# Patient Record
Sex: Male | Born: 1965 | Race: Black or African American | Hispanic: No | Marital: Single | Smoking: Never smoker
Health system: Southern US, Community
[De-identification: ages and names within clinical notes are randomized; demographics above are authoritative.]

## PROBLEM LIST (undated history)

## (undated) DIAGNOSIS — E119 Type 2 diabetes mellitus without complications: Secondary | ICD-10-CM

## (undated) DIAGNOSIS — E669 Obesity, unspecified: Secondary | ICD-10-CM

## (undated) DIAGNOSIS — M109 Gout, unspecified: Secondary | ICD-10-CM

## (undated) DIAGNOSIS — I739 Peripheral vascular disease, unspecified: Secondary | ICD-10-CM

## (undated) DIAGNOSIS — I10 Essential (primary) hypertension: Secondary | ICD-10-CM

## (undated) HISTORY — DX: Gout, unspecified: M10.9

## (undated) HISTORY — DX: Essential (primary) hypertension: I10

## (undated) HISTORY — DX: Type 2 diabetes mellitus without complications: E11.9

---

## 1999-12-25 ENCOUNTER — Emergency Department (HOSPITAL_COMMUNITY): Admission: EM | Admit: 1999-12-25 | Discharge: 1999-12-25 | Payer: Self-pay | Admitting: Emergency Medicine

## 1999-12-25 ENCOUNTER — Encounter: Payer: Self-pay | Admitting: Emergency Medicine

## 2017-07-05 ENCOUNTER — Emergency Department
Admission: EM | Admit: 2017-07-05 | Discharge: 2017-07-05 | Disposition: A | Discharge status: Home / Self Care | Payer: No Typology Code available for payment source | Attending: Internal Medicine | Admitting: Internal Medicine

## 2017-07-05 DIAGNOSIS — E1165 Type 2 diabetes mellitus with hyperglycemia: Secondary | ICD-10-CM | POA: Insufficient documentation

## 2017-07-05 DIAGNOSIS — Z79899 Other long term (current) drug therapy: Secondary | ICD-10-CM | POA: Insufficient documentation

## 2017-07-05 DIAGNOSIS — Z794 Long term (current) use of insulin: Secondary | ICD-10-CM | POA: Insufficient documentation

## 2017-07-05 DIAGNOSIS — M25511 Pain in right shoulder: Secondary | ICD-10-CM | POA: Insufficient documentation

## 2017-07-05 DIAGNOSIS — I10 Essential (primary) hypertension: Secondary | ICD-10-CM | POA: Insufficient documentation

## 2017-07-05 LAB — GLUCOSE WHOLE BLOOD - POCT: Whole Blood Glucose POCT: 228 mg/dL — ABNORMAL HIGH (ref 70–100)

## 2017-07-05 MED ORDER — NAPROXEN 250 MG PO TABS
250.00 mg | ORAL_TABLET | Freq: Two times a day (BID) | ORAL | 0 refills | Status: DC
Start: 2017-07-05 — End: 2017-07-19

## 2017-07-05 MED ORDER — KETOROLAC TROMETHAMINE 60 MG/2ML IM SOLN
60.00 mg | Freq: Once | INTRAMUSCULAR | Status: AC
Start: 2017-07-05 — End: 2017-07-05
  Administered 2017-07-05: 20:00:00 60 mg via INTRAMUSCULAR
  Filled 2017-07-05: qty 2

## 2017-07-05 MED ORDER — OXYCODONE-ACETAMINOPHEN 5-325 MG PO TABS
1.00 | ORAL_TABLET | Freq: Once | ORAL | Status: AC
Start: 2017-07-05 — End: 2017-07-05
  Administered 2017-07-05: 20:00:00 1 via ORAL
  Filled 2017-07-05: qty 1

## 2017-07-05 MED ORDER — CYCLOBENZAPRINE HCL 10 MG PO TABS
10.00 mg | ORAL_TABLET | Freq: Two times a day (BID) | ORAL | 0 refills | Status: AC | PRN
Start: 2017-07-05 — End: 2017-07-20

## 2017-07-05 NOTE — ED Triage Notes (Signed)
[name] is 52 y.o. male who presents to ED with c/o right shoulder pain for the past 2 weeks. Pt denies any known trauma or injury. Attempted to get relief by putting Tiger Balm patch, lidocaine cream and then a heating pad, pt states afterwards he "broke out". Seen by PCP and given anti-inflammatory and muscle relaxer.     BP 134/78   Pulse 98   Temp 98.1 F (36.7 C) (Oral)   Resp 20   Wt 95.3 kg   SpO2 98%

## 2017-07-05 NOTE — Discharge Instructions (Signed)
Arthralgia    You have been diagnosed with Arthralgia.    Arthralgia means pain and stiffness of the joints. People often describe the pain as aching or throbbing. Arthralgia can affect one or more joints. It can be caused by many types of conditions and/or injuries. Often, arthralgia lasts for a long time and people need treatment over months or years. Some causes of arthralgia are:     Infection with a virus.   Many types of infections that are starting to improve. When recovering from infection, sometimes there is joint pain.    Autoimmune diseases (where the body attacks itself). Examples are Lupus or Rheumatoid Arthritis.   Inflammation of the tendons or the fluid-filled sacs (bursa) surrounding your joints.   Low thyroid function.   Depression.     You might need another exam or more tests to find out why you have arthralgias. At this time, the cause of your symptoms does not seem dangerous. You do not need to stay in the hospital.    We don't believe your condition is dangerous right now. However, you need to be careful. Sometimes a problem that seems small can get serious later. This is why it is very important to come back here or go to the nearest Emergency Department unless you are much improved.    Clues that joint pain is dangerous are:     Hot and swollen joints. This may mean they are infected.   Fever (Temperature higher than 100.4F or 38C), weight loss and feeling very ill can be symptoms of severe infection (sepsis).   Severe pain, weakness or numbness (loss of feeling).    Some things you can try at home are:     Over-the-counter pain medications.   Heating pads and warm baths.   Physical therapy.    Follow the instructions for any medication you get prescribed.     Have a close follow-up with your primary care doctor.    YOU SHOULD SEEK MEDICAL ATTENTION IMMEDIATELY, EITHER HERE OR AT THE NEAREST EMERGENCY DEPARTMENT, IF ANY OF THE FOLLOWING OCCURS:     You have a  fever (temperature higher than 100.4F or 38C).   Your pain does not go away or gets worse.   The joint that hurts turns red and/or gets swollen.   You suddenly can t walk.   You don t feel better after treatment or feel you re getting worse.   You get any other symptoms, concerns, or don t get better as expected.    If you can t follow up with your doctor, or if at any time you feel you need to be rechecked or seen again, come back here or go to the nearest emergency department.

## 2017-07-05 NOTE — ED Provider Notes (Signed)
EMERGENCY DEPARTMENT HISTORY AND PHYSICAL EXAM     Physician/Midlevel provider first contact with patient: 07/05/17 1850         Date: 07/05/2017  Patient Name: [name]    History of Presenting Illness     History Provided By: pt    Chief Complaint   Patient presents with   . Shoulder Pain     right   . Rash     Onset:   Timing:   Location:   Quality:   Severity:   Modifying Factors:   Associated Symptoms:     Additional History: [name] is a 52 y.o. male with HTN, DM2 p/w R shoulder pain x 2 wks. Pain is anterolateral, "nagging", worse on ROM and lifting. Pain is worst at the end of the day. He denies known injury. He was seen at an outside hospital last week, X-ray performed at that time was normal.  He was prescribed naproxen and Flexeril, which helped but he has since run out of the medications. Pt works in [employer], he lives out of town but is here for a few months for a project. Denies fevers, DROM, numbness, weakness, tingling.     PCP: Pcp, [facility], [place]      No current facility-administered medications for this encounter.      Current Outpatient Prescriptions   Medication Sig Dispense Refill   . Empagliflozin-metFORMIN HCl (SYNJARDY) 12.07-998 MG Tab Take by mouth     . insulin glargine (TOUJEO MAX SOLOSTAR) 300 UNIT/ML injection pen Inject into the skin nightly     . liraglutide (VICTOZA) 18 MG/3ML injection Inject into the skin     . lisinopril (PRINIVIL,ZESTRIL) 40 MG tablet Take 40 mg by mouth daily     . cyclobenzaprine (FLEXERIL) 10 MG tablet Take 1 tablet (10 mg total) by mouth 2 (two) times daily as needed for Muscle spasms 15 tablet 0   . naproxen (NAPROSYN) 250 MG tablet Take 1 tablet (250 mg total) by mouth 2 (two) times daily with meals 20 tablet 0       Past History     Past Medical History:  Past Medical History:   Diagnosis Date   . Diabetes mellitus    . Hypertension        Past Surgical History:  History reviewed. No pertinent surgical history.    Family  History:  No family history on file.    Social History:  Social History   Substance Use Topics   . Smoking status: Never Smoker   . Smokeless tobacco: Never Used   . Alcohol use No       Allergies:  No Known Allergies    Review of Systems     Review of Systems   Constitutional: Negative for chills and fever.   Musculoskeletal: Positive for joint pain. Negative for falls.   Neurological: Negative for tingling, sensory change and focal weakness.         Physical Exam     Vitals:    07/05/17 1649 07/05/17 1948   BP: 134/78 122/72   Pulse: 98 84   Resp: 20 16   Temp: 98.1 F (36.7 C)    TempSrc: Oral    SpO2: 98% 98%   Weight: 95.3 kg        Physical Exam   Constitutional: He is oriented to person, place, and time. He appears well-developed and well-nourished. No distress.   HENT:   Head: Normocephalic and atraumatic.   Mouth/Throat:  Oropharynx is clear and moist.   Eyes: Pupils are equal, round, and reactive to light. Conjunctivae and EOM are normal.   Neck: Normal range of motion. Neck supple.   Cardiovascular: Normal rate and regular rhythm.    Pulses:       Radial pulses are 2+ on the right side, and 2+ on the left side.   Pulmonary/Chest: Effort normal and breath sounds normal.   Musculoskeletal:        Right shoulder: He exhibits tenderness (mild, anterolateral shoulder). He exhibits normal range of motion, no swelling, no deformity, normal pulse and normal strength.   R shoulder with FROM and 5/5 strength, however pain reported on abduction > 90 degrees and when  moving against resistance.  NV intact throughout.    Neurological: He is alert and oriented to person, place, and time.   Skin: Skin is warm and dry.   Psychiatric: He has a normal mood and affect. His behavior is normal.   Nursing note and vitals reviewed.        Diagnostic Study Results     Labs -     Results     Procedure Component Value Units Date/Time    Glucose Whole Blood - POCT [[id number]]  (Abnormal) Collected:  07/05/17 1926     Updated:   07/05/17 1928     POCT - Glucose Whole blood 228 (H) mg/dL           Radiologic Studies -   Radiology Results (24 Hour)     ** No results found for the last 24 hours. **      .      Medical Decision Making   I am the first provider for this patient.    I reviewed the vital signs, available nursing notes, past medical history, past surgical history, family history and social history.    Vital Signs-Reviewed the patient's vital signs.     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 Declines imaging, as this was done at OSH last week. Suspecting rotator cuff pathology or bursitis. No evidence of septic joint. Pt amenable to d/c home with flexeril, naproxen, sling. Advised against long term use d/t risk of frozen shoulder. F/u with ortho. Pt in agreement with plan.       Diagnosis     Clinical Impression:   1. Acute pain of right shoulder    2. Type 2 diabetes mellitus with hyperglycemia, with long-term current use of insulin        _______________________________    Attestations:  Thea Gist, PA-C, am the primary clinician of record.    Signed by: Sondra Barges, PA-C    _______________________________       Orlene Erm, PA  07/08/17 1733

## 2017-07-19 ENCOUNTER — Emergency Department: Payer: No Typology Code available for payment source

## 2017-07-19 ENCOUNTER — Emergency Department
Admission: EM | Admit: 2017-07-19 | Discharge: 2017-07-19 | Disposition: A | Discharge status: Home / Self Care | Payer: No Typology Code available for payment source | Attending: Emergency Medicine | Admitting: Emergency Medicine

## 2017-07-19 DIAGNOSIS — Z79899 Other long term (current) drug therapy: Secondary | ICD-10-CM | POA: Insufficient documentation

## 2017-07-19 DIAGNOSIS — I10 Essential (primary) hypertension: Secondary | ICD-10-CM | POA: Insufficient documentation

## 2017-07-19 DIAGNOSIS — M25511 Pain in right shoulder: Secondary | ICD-10-CM | POA: Insufficient documentation

## 2017-07-19 DIAGNOSIS — E119 Type 2 diabetes mellitus without complications: Secondary | ICD-10-CM | POA: Insufficient documentation

## 2017-07-19 DIAGNOSIS — Z7984 Long term (current) use of oral hypoglycemic drugs: Secondary | ICD-10-CM | POA: Insufficient documentation

## 2017-07-19 MED ORDER — NAPROXEN 250 MG PO TABS
500.00 mg | ORAL_TABLET | Freq: Once | ORAL | Status: AC
Start: 2017-07-19 — End: 2017-07-19
  Administered 2017-07-19: 19:00:00 500 mg via ORAL
  Filled 2017-07-19: qty 2

## 2017-07-19 MED ORDER — METHOCARBAMOL 500 MG PO TABS
500.00 mg | ORAL_TABLET | Freq: Four times a day (QID) | ORAL | 0 refills | Status: DC
Start: 2017-07-19 — End: 2017-08-04

## 2017-07-19 MED ORDER — NAPROXEN 250 MG PO TABS
500.00 mg | ORAL_TABLET | Freq: Two times a day (BID) | ORAL | 0 refills | Status: DC
Start: 2017-07-19 — End: 2017-08-04

## 2017-07-19 NOTE — ED Provider Notes (Signed)
EMERGENCY DEPARTMENT HISTORY AND PHYSICAL EXAM     Physician/Midlevel provider first contact with patient: 07/19/17 1647         Date: 07/19/2017  Patient Name: [name]  Attending Physician: Darcus Pester, MD  Advanced Practice Provider: Sena Hitch    History of Presenting Illness       History Provided By: Patient    Additional History: [name] is a 52 y.o. male presenting to the ED with c/o R shoulder pain x wks ago. States he woke up with R shoulder pain, but he does not remember any injury. States pain is 10/10, dull, agg with movement.     PCP: Pcp, [facility], MD  SPECIALISTS:     Jamaury, Gumz   Home Medication Instructions [id number]    Printed on:07/20/17 0951   Medication Information 08 AM 10 AM 12 Noon 06 PM 08 PM 10 PM Bed time             cyclobenzaprine (FLEXERIL) 10 MG tablet  Take 1 tablet (10 mg total) by mouth 2 (two) times daily as needed for Muscle spasms            Empagliflozin-metFORMIN HCl (SYNJARDY) 12.07-998 MG Tab  Take by mouth            insulin glargine (TOUJEO MAX SOLOSTAR) 300 UNIT/ML injection pen  Inject into the skin nightly            liraglutide (VICTOZA) 18 MG/3ML injection  Inject into the skin            lisinopril (PRINIVIL,ZESTRIL) 40 MG tablet  Take 40 mg by mouth daily            methocarbamol (ROBAXIN) 500 MG tablet  Take 1 tablet (500 mg total) by mouth 4 (four) times daily            naproxen (NAPROSYN) 250 MG tablet  Take 2 tablets (500 mg total) by mouth 2 (two) times daily with meals                Past History     Past Medical History:  Past Medical History:   Diagnosis Date   . Diabetes mellitus    . Hypertension        Past Surgical History:  History reviewed. No pertinent surgical history.    Family History:  History reviewed. No pertinent family history.    Social History:  Social History     Social History   . Marital status: Single     Spouse name: N/A   . Number of children: N/A   . Years of education: N/A     Social  History Main Topics   . Smoking status: Never Smoker   . Smokeless tobacco: Never Used   . Alcohol use No   . Drug use: No   . Sexual activity: Not Asked     Other Topics Concern   . None     Social History Narrative   . None       Allergies:  No Known Allergies    Review of Systems     Review of Systems   Constitutional: Negative for chills and fever.   HENT: Negative for congestion.    Eyes: Negative for discharge and redness.   Respiratory: Negative for cough and shortness of breath.    Cardiovascular: Negative for chest pain.   Gastrointestinal: Negative for abdominal pain and vomiting.   Musculoskeletal: Positive  for joint pain (R shoulder pain). Negative for back pain and neck pain.   Neurological: Negative for dizziness.       Physical Exam     Vitals:    07/19/17 1605 07/19/17 1835   BP: 136/77 140/88   Pulse: 95 96   Resp: 16 16   Temp: 98.3 F (36.8 C)    TempSrc: Oral    SpO2: 100% 97%   Weight: 97.5 kg    Height: 5' 9.6" (1.768 m)        Physical Exam   Constitutional: He is oriented to person, place, and time. He appears well-developed and well-nourished.   nontoxic   HENT:   Head: Normocephalic and atraumatic.   Eyes: Conjunctivae are normal. Right eye exhibits no discharge. Left eye exhibits no discharge.   Neck: Normal range of motion. Neck supple.   Cardiovascular: Normal rate and regular rhythm.    Pulmonary/Chest: Effort normal and breath sounds normal. No respiratory distress.   Musculoskeletal: Normal range of motion.   R shoulder pain on internal rotation.    Neurological: He is alert and oriented to person, place, and time.   Skin: Skin is warm and dry.   Psychiatric: He has a normal mood and affect.   Nursing note and vitals reviewed.      Diagnostic Study Results     Labs -     Results     ** No results found for the last 24 hours. **          Radiologic Studies -   Radiology Results (24 Hour)     Procedure Component Value Units Date/Time    Shoulder Right 2+ Views [[id number]] Collected:   07/19/17 1713    Order Status:  Completed Updated:  07/19/17 1721    Narrative:       XR SHOULDER RIGHT 2+ VIEWS    CLINICAL INDICATION:   shoulder pain    TECHNIQUE: Right shoulder 3 views 3 images    COMPARISON: None available    FINDINGS:  Visualized portion of the right lung is clear. Bone  mineralization appears within normal limits. No acute fracture or  subluxation is identified. No gross abnormality is seen in the soft  tissues.      Impression:           1.  No acute osseous abnormality is identified.    [name] Felts, MD   07/19/2017 5:17 PM      .    Medical Decision Making   I am the first provider for this patient.    I reviewed the vital signs, available nursing notes, past medical history, past surgical history, family history and social history.    Vital Signs-Reviewed the patient's vital signs.   No data found.      Pulse Oximetry Analysis - Normal 100 % on RA    Clinical Decision Support:       ED Course:    Naproxen 500 mg po      DDX  Shoulder strain.   Tendonitis.   DJD    Provider Notes:    52 y.o. male with R shoulder pain. Will refer to ortho.      Rx use and side effects, results, home self care, discharge instructions, and return precautions discussed extensively with patient. Possibility of evolving illness reviewed. All questions solicited and addressed. Patient is amenable to discharge.       Discharge Prescriptions     Medication Sig Dispense  Auth. Provider    methocarbamol (ROBAXIN) 500 MG tablet Take 1 tablet (500 mg total) by mouth 4 (four) times daily 24 tablet [name] T, PA    naproxen (NAPROSYN) 250 MG tablet Take 2 tablets (500 mg total) by mouth 2 (two) times daily with meals 20 tablet [name] T, [place]            Diagnosis     Clinical Impression:   1. Right shoulder pain, unspecified chronicity        Treatment Plan:   ED Disposition     ED Disposition Condition Date/Time Comment    Discharge  Tue Jul 19, 2017  6:15 PM [name] discharge to home/self  care.    Condition at disposition: Stable            _______________________________    CHART OWNERSHIPOpal Sidles, PA-C, am the primary clinician of record.  _______________________________       Sena Hitch, PA  07/20/17 0865       Darcus Pester, MD  07/21/17 [phone]

## 2017-07-19 NOTE — ED Triage Notes (Signed)
See quick triage

## 2017-07-19 NOTE — Discharge Instructions (Signed)
Arm Pain    You have been seen for arm pain.    Many things can cause arm pain. Most of the causes are not dangerous and will get better on their own. These can be muscle cramps, bruises, strains, pinched nerves, and minor skin infections for example.    You had an exam by your doctor today. He or she decided that the cause of your arm pain does not seem to be serious or dangerous.    If your pain continues, you might need another exam or more tests. This is to find out why you have arm pain. The cause of your symptoms doesn t seem dangerous now. You don t need to stay in the hospital.    Though we don't believe your condition is dangerous right now, it is important to be careful. Sometimes a problem that seems mild can become serious later. This is why it is very important that you return here or go to the nearest Emergency Department if you are not improving or your symptoms are getting worse.    Some things you may try at home are:    Over-the-counter pain medications that have ibuprofen (Advil/Motrin) or acetaminophen (Tylenol) in them. Follow the directions on the package.   Rest the arm as needed. As soon as you are able, start moving your arm again to keep it from getting stiff.    Return here or go to the nearest Emergency Department or follow-up with your doctor if you are not getting better as expected.    Follow the instructions for any medication you are prescribed.     YOU SHOULD SEEK MEDICAL ATTENTION IMMEDIATELY, EITHER HERE OR AT THE NEAREST EMERGENCY DEPARTMENT, IF ANY OF THE FOLLOWING HAPPENS:     You have a fever (temperature higher than 100.4F or 38C).   Your pain does not go away or gets worse.   Your arm or joints (elbow, wrist, shoulder, etc.) get red or swollen.   Your chest hurts or you get short of breath.   You have any other symptoms or concerns, or don t get better as expected.    If you can t follow up with your doctor, or if at any time you feel you need to be  rechecked or seen again, come back here or go to the nearest emergency department.

## 2017-07-26 ENCOUNTER — Emergency Department
Admission: EM | Admit: 2017-07-26 | Discharge: 2017-07-26 | Disposition: A | Discharge status: Home / Self Care | Payer: No Typology Code available for payment source | Attending: Emergency Medicine | Admitting: Emergency Medicine

## 2017-07-26 DIAGNOSIS — M25511 Pain in right shoulder: Secondary | ICD-10-CM | POA: Insufficient documentation

## 2017-07-26 DIAGNOSIS — I10 Essential (primary) hypertension: Secondary | ICD-10-CM | POA: Insufficient documentation

## 2017-07-26 DIAGNOSIS — Z79899 Other long term (current) drug therapy: Secondary | ICD-10-CM | POA: Insufficient documentation

## 2017-07-26 DIAGNOSIS — Z794 Long term (current) use of insulin: Secondary | ICD-10-CM | POA: Insufficient documentation

## 2017-07-26 DIAGNOSIS — E119 Type 2 diabetes mellitus without complications: Secondary | ICD-10-CM | POA: Insufficient documentation

## 2017-07-26 MED ORDER — COLCHICINE 0.6 MG PO TABS
ORAL_TABLET | ORAL | 0 refills | Status: DC
Start: 2017-07-26 — End: 2017-08-01

## 2017-07-26 MED ORDER — TRAMADOL HCL 50 MG PO TABS
50.00 mg | ORAL_TABLET | Freq: Four times a day (QID) | ORAL | 0 refills | Status: DC | PRN
Start: 2017-07-26 — End: 2017-07-26

## 2017-07-26 NOTE — ED Provider Notes (Signed)
EMERGENCY DEPARTMENT HISTORY AND PHYSICAL EXAM     Physician/Midlevel provider first contact with patient: 07/26/17 1600         Date: 07/26/2017  Patient Name: [name]    History of Presenting Illness     Chief Complaint   Patient presents with   . Shoulder Pain       History Provided By: Patient    Chief Complaint: [name] is a 52 y.o. male who presents to the ED c/o persistent right shoulder pain for about 1 month. This is his 3rd ED visit for the same. He had negative x-rays of his shoulder 1 week ago. He states that he made an appointment with Rush Farmer, MD (Orthopedics). He is taking Naprosyn 500 mg without relief. His pain is constant, 9/10, dull, non-radiating, and worse with movement.     PCP: Pcp, [facility], [place]        No current facility-administered medications for this encounter.      Current Outpatient Prescriptions   Medication Sig Dispense Refill   . Empagliflozin-metFORMIN HCl (SYNJARDY) 12.07-998 MG Tab Take by mouth     . insulin glargine (TOUJEO MAX SOLOSTAR) 300 UNIT/ML injection pen Inject into the skin nightly     . liraglutide (VICTOZA) 18 MG/3ML injection Inject into the skin     . lisinopril (PRINIVIL,ZESTRIL) 40 MG tablet Take 40 mg by mouth daily     . methocarbamol (ROBAXIN) 500 MG tablet Take 1 tablet (500 mg total) by mouth 4 (four) times daily 24 tablet 0   . naproxen (NAPROSYN) 250 MG tablet Take 2 tablets (500 mg total) by mouth 2 (two) times daily with meals 20 tablet 0   . traMADol (ULTRAM) 50 MG tablet Take 1 tablet (50 mg total) by mouth every 6 (six) hours as needed for Pain Do not drive or operate machinery while taking this medication 12 tablet 0       Past History     Past Medical History:  Past Medical History:   Diagnosis Date   . Diabetes mellitus    . Hypertension        Past Surgical History:  History reviewed. No pertinent surgical history.    Family History:  Family History   Problem Relation Age of Onset   . Diabetes Mother    .  Hypertension Mother        Social History:  Social History   Substance Use Topics   . Smoking status: Never Smoker   . Smokeless tobacco: Never Used   . Alcohol use No       Allergies:  No Known Allergies    Review of Systems     Review of Systems   Constitutional: Negative for chills and fever.   HENT: Negative for congestion and nosebleeds.    Eyes: Negative for discharge and redness.   Respiratory: Negative for cough, sputum production, shortness of breath and wheezing.    Cardiovascular: Negative for chest pain and palpitations.   Gastrointestinal: Negative for abdominal pain, nausea and vomiting.   Genitourinary: Negative.    Musculoskeletal: Positive for joint pain (right shoulder). Negative for back pain and falls.   Skin: Negative.    Neurological: Negative for dizziness, seizures, loss of consciousness and headaches.   Psychiatric/Behavioral: Negative for suicidal ideas.       Physical Exam   BP 161/79   Pulse 95   Temp 97.7 F (36.5 C) (Oral)   Resp 18   Ht 5'  10.8" (1.798 m)   Wt 97.5 kg   SpO2 98%   BMI 30.16 kg/m     Physical Exam   Constitutional: He is oriented to person, place, and time. He appears well-developed and well-nourished. No distress.   HENT:   Head: Normocephalic and atraumatic.   Eyes: Pupils are equal, round, and reactive to light. Conjunctivae and EOM are normal.   Neck: Normal range of motion. Neck supple.   Cardiovascular: Normal rate, regular rhythm and intact distal pulses.    Pulmonary/Chest: Effort normal. No respiratory distress.   Musculoskeletal: He exhibits tenderness (over right deltoid). He exhibits no edema or deformity.        Right shoulder: He exhibits bony tenderness. He exhibits normal range of motion, no swelling, no effusion, no crepitus, no deformity, no laceration, no spasm, normal pulse and normal strength.        Arms:  Distal NMV status is intact.    Neurological: He is alert and oriented to person, place, and time.   Skin: Skin is warm and dry.    Psychiatric: He has a normal mood and affect. His behavior is normal. Judgment and thought content normal.   Nursing note and vitals reviewed.        Medical Decision Making   I am the first provider for this patient.    I reviewed today's vital signs and ED nursing notes.    Vital Signs-Reviewed the patient's vital signs.     Patient Vitals for the past 12 hrs:   BP Temp Pulse Resp   07/26/17 1606 161/79 97.7 F (36.5 C) 95 18       Pulse Oximetry Analysis - Normal, 98% on RA    Provider Notes:    1630: At the time of discharge, the patient states that he has Ultram from "back home" and it doesn't work. He states that he has a history of gout and took his last 2 colchicine caps this past weekend. The pain temporarily went away. He wants to try the colchicine.       Diagnosis     Clinical Impression:   1. Acute pain of right shoulder        _______________________________           Fransisca Connors, PA  07/26/17 1645       Juliane Poot, MD PhD  07/26/17 [phone]

## 2017-08-01 ENCOUNTER — Encounter (INDEPENDENT_AMBULATORY_CARE_PROVIDER_SITE_OTHER): Payer: Self-pay | Admitting: Family Medicine

## 2017-08-01 ENCOUNTER — Ambulatory Visit (INDEPENDENT_AMBULATORY_CARE_PROVIDER_SITE_OTHER): Payer: No Typology Code available for payment source | Admitting: Family Medicine

## 2017-08-01 VITALS — BP 101/66 | HR 92 | Temp 98.8°F

## 2017-08-01 DIAGNOSIS — B029 Zoster without complications: Secondary | ICD-10-CM

## 2017-08-01 DIAGNOSIS — M7541 Impingement syndrome of right shoulder: Secondary | ICD-10-CM

## 2017-08-01 DIAGNOSIS — M25531 Pain in right wrist: Secondary | ICD-10-CM

## 2017-08-01 MED ORDER — ACYCLOVIR 800 MG PO TABS
800.00 mg | ORAL_TABLET | Freq: Two times a day (BID) | ORAL | 0 refills | Status: DC
Start: 2017-08-01 — End: 2017-08-17

## 2017-08-01 MED ORDER — TRIAMCINOLONE ACETONIDE 40 MG/ML IJ SUSP
40.00 mg | Freq: Once | INTRAMUSCULAR | Status: AC
Start: 2017-08-01 — End: 2017-08-01
  Administered 2017-08-01: 14:00:00 40 mg via INTRA_ARTICULAR

## 2017-08-02 ENCOUNTER — Telehealth (INDEPENDENT_AMBULATORY_CARE_PROVIDER_SITE_OTHER): Payer: Self-pay

## 2017-08-02 NOTE — Telephone Encounter (Signed)
Patient called stating that he was still in a lot of pain and what he should do. The patient had a Cortizone injection yesterday (08/01/17). I spoke with Dr. Shanon Rosser and I explained to the patient that The injection once injected into the space will take a couple days to be absorbed and it could be a little more discomfort until it gets absorbed. He also said that the medication that was prescibed was not working. We informed him it takes a couple days for the medicine to take effect. He also was complaining of constipation and we suggested that he take some Colace. Patient understood and was told to call back in a couple days if things had not improved.

## 2017-08-02 NOTE — Progress Notes (Signed)
[name] is a 52 y.o. right handed male referred by the ER for evaluation and treatment of right shoulder pain. This is evaluated as a personal injury. The pain began several weeks ago. The onset of the pain was sudden, related to increase in use and work. He normally works in [place] but has been brought up to help with a project in the area. . Mechanism of injury: repetitive use pattern. The pain is described as aching, sharp, shooting and tight band. The pain occurs when active. Location is posterior, lateral. No history of dislocation. Symptoms are aggravated by all activities. Symptoms are diminished by  rest, avoiding the painful activities. Limited activities include: all activities. No stiffness, no weakness is reported. 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 Related history: Seen in the ER for several visits for same issue. Also complicated by using Bengay and heating pad that may has caused a shingles eruption down his arm.    Outside reports reviewed: historical medical records.    The following portions of the patient's history were reviewed and updated as appropriate: allergies, current medications, past family history, past medical history, past social history, past surgical history and problem list.  Patient's medications, allergies, past medical, surgical, social and family histories were reviewed and updated as appropriate.     Review of systems: History obtained from the patient  General ROS: negative for - chills, fever or night sweats  Hematological and Lymphatic ROS: negative for - bleeding problems, jaundice or pallor  Endocrine ROS: negative for - hot flashes, malaise/lethargy or mood swings  Respiratory ROS: negative for - cough, shortness of breath or wheezing  Cardiovascular ROS: negative for - chest pain, loss of consciousness or shortness of breath  Gastrointestinal ROS: negative for - abdominal pain, appetite loss or nausea/vomiting  Musculoskeletal ROS: positive  for - see chief complaint     Objective:     Vitals:    08/01/17 1316   BP: 101/66   Pulse: 92   Temp: 98.8 F (37.1 C)       General :   alert, active, cooperative and in no acute distress   Gait: Normal.     Right (Affected) Shoulder   Bruising:   absent   Crepitus:  absent   Tenderness:  lateral acromial, posterior acromial   Effusion:   none present   Biceps Tendon Rupture:   absent   Winging Scapula:   negative   Active ROM:   abduction to 180, flexion to 180, external rotation to 60, internal rotation to 60   Neer:   positive   Hawkins:  negative   Empty Can/Drop Arm:  positive   Speed's:  positive   O'Brien's:  positive   Stability:   normal   Apprehension Sign:   negative/negative   Atrophy:   none noted   Strength:  abduction 4/5, flexion 4/5, supraspinatus 4/5, external rotation 4/5, internal rotation 4/5, elbow flexion 4/5, elbow extension 4/5   Sensation:  intact to light touch; axillary sensation symmetric   Vesicular lesion in various states of healing in a dermatomal pattern down his right arm.    Left (Unaffected) Shoulder   Bruising:   absent   Crepitus:  absent   Tenderness:  There is no tenderness noted at today's visit   Effusion:   none present   Biceps Tendon Rupture:   absent   Winging Scapula:   negative   Active ROM:   abduction to  180, flexion to 180, external rotation to 70, internal rotation to 70   Neer:   negative   Hawkins:  negative   Empty Can/Drop Arm:  negative   Speed's:   negative   O'Brien's:  negative   Stability:   normal   Apprehension Sign:   negative   Atrophy:   none noted   Strength:  abduction 5/5, flexion 5/5, supraspinatus 5/5, external rotation 5/5, internal rotation 5/5, elbow flexion 5/5, elbow extension 5/5   Sensation:  intact to light touch; axillary sensation symmetric     Imaging  None today     Assessment:     Encounter Diagnoses   Name Primary?   . Rotator cuff impingement syndrome of right shoulder Yes   . Herpes zoster without complication    . Right wrist  pain           Plan:   Physical Therapy  Activity Modification  NSAIDs  Follow-up in 6-8 weeks  Discussed possible injection therapies  Proper nutrition, hydration, and rest for recovery  Acyclovir    LANDMARK GUIDED INJECTION  Verbal informed consent was obtained prior to the procedure. Prior to this, the risks and side effects were discussed, including, but not limited to pain, infection, allergic reaction, bleeding, damage to tissues, vessels and nerves, skin atrophy, steroid flare.    I performed a LANDMARK guided Injection of the right  shoulder via a subacromial approach after  chlorohexadine prep using a 22 ga 1.5" needle, injecting 1.0 mL of 40mg /mL Kenalog,  and 6 ml Lidocaine 1% w/o Epi. -----> 50 % relief of symptoms after injection. The patient tolerated the procedure well without complications.    The injection was performed for therapeutic purposes

## 2017-08-04 ENCOUNTER — Encounter (INDEPENDENT_AMBULATORY_CARE_PROVIDER_SITE_OTHER): Payer: Self-pay | Admitting: Family Medicine

## 2017-08-04 ENCOUNTER — Other Ambulatory Visit (INDEPENDENT_AMBULATORY_CARE_PROVIDER_SITE_OTHER): Payer: Self-pay

## 2017-08-04 DIAGNOSIS — M25531 Pain in right wrist: Secondary | ICD-10-CM

## 2017-08-04 DIAGNOSIS — M7541 Impingement syndrome of right shoulder: Secondary | ICD-10-CM

## 2017-08-04 MED ORDER — DICLOFENAC SODIUM 75 MG PO TBEC
75.00 mg | DELAYED_RELEASE_TABLET | Freq: Two times a day (BID) | ORAL | 0 refills | Status: DC
Start: 2017-08-04 — End: 2017-08-17

## 2017-08-04 MED ORDER — ACETAMINOPHEN-CODEINE #3 300-30 MG PO TABS
1.00 | ORAL_TABLET | Freq: Four times a day (QID) | ORAL | 0 refills | Status: AC | PRN
Start: 2017-08-04 — End: 2017-08-11

## 2017-08-04 NOTE — Progress Notes (Signed)
Patient called back that he provided incorrect pharmacy information.  Left detailed message at [facility] on [address]  Patient is there now and will call back as needed.

## 2017-08-04 NOTE — Progress Notes (Signed)
Pt called RE: continued pain shoulder  Stated called earlier in the week but continued prescribed meds as trial for pain relief.   Had significant discussion regarding previous trials of naproxen, flexeril, robaxin, and tramadol.  Pt stated leaving for [place] tomorrow to return home.  Escribed Diclofenac PO and called in T3 so patient able to establish care at home.  PMP ran, looks good.

## 2017-08-17 ENCOUNTER — Emergency Department
Admission: EM | Admit: 2017-08-17 | Discharge: 2017-08-17 | Disposition: A | Discharge status: Home / Self Care | Payer: No Typology Code available for payment source | Attending: Emergency Medicine | Admitting: Emergency Medicine

## 2017-08-17 DIAGNOSIS — Z8249 Family history of ischemic heart disease and other diseases of the circulatory system: Secondary | ICD-10-CM | POA: Insufficient documentation

## 2017-08-17 DIAGNOSIS — Z79899 Other long term (current) drug therapy: Secondary | ICD-10-CM | POA: Insufficient documentation

## 2017-08-17 DIAGNOSIS — Z794 Long term (current) use of insulin: Secondary | ICD-10-CM | POA: Insufficient documentation

## 2017-08-17 DIAGNOSIS — M109 Gout, unspecified: Secondary | ICD-10-CM | POA: Insufficient documentation

## 2017-08-17 DIAGNOSIS — E119 Type 2 diabetes mellitus without complications: Secondary | ICD-10-CM | POA: Insufficient documentation

## 2017-08-17 DIAGNOSIS — I10 Essential (primary) hypertension: Secondary | ICD-10-CM | POA: Insufficient documentation

## 2017-08-17 MED ORDER — KETOROLAC TROMETHAMINE 60 MG/2ML IM SOLN
60.00 mg | Freq: Once | INTRAMUSCULAR | Status: AC
Start: 2017-08-17 — End: 2017-08-17
  Administered 2017-08-17: 11:00:00 60 mg via INTRAMUSCULAR
  Filled 2017-08-17: qty 2

## 2017-08-17 MED ORDER — ONDANSETRON 4 MG PO TBDP
4.00 mg | ORAL_TABLET | Freq: Once | ORAL | Status: AC
Start: 2017-08-17 — End: 2017-08-17
  Administered 2017-08-17: 12:00:00 4 mg via ORAL
  Filled 2017-08-17: qty 1

## 2017-08-17 NOTE — ED Provider Notes (Signed)
EMERGENCY DEPARTMENT HISTORY AND PHYSICAL EXAM     Physician/Midlevel provider first contact with patient: 08/17/17 1100         Date: 08/17/2017  Patient Name: [name]    History of Presenting Illness     Chief Complaint   Patient presents with   . Gout       History Provided By: Patient    Chief Complaint: [name] is a 52 y.o. male with a history to include gout who presents to the ED c/o left great toe pain for 2 days after eating crabs 3 days ago. He used NSAIDs and colchicine but started having diarrhea (from the colchicine). He is working for [employer] and has his PCP in [place]. He denies trauma, fall, or wound. He states that IM Toradol or steroids have worked in the past under these circumstances.     PCP: Pcp, [facility], MD        No current facility-administered medications for this encounter.      Current Outpatient Prescriptions   Medication Sig Dispense Refill   . colchicine 0.6 MG tablet Take 0.6 mg by mouth daily     . Empagliflozin-metFORMIN HCl (SYNJARDY) 12.07-998 MG Tab Take by mouth     . insulin glargine (TOUJEO MAX SOLOSTAR) 300 UNIT/ML injection pen Inject into the skin nightly     . liraglutide (VICTOZA) 18 MG/3ML injection Inject into the skin     . lisinopril (PRINIVIL,ZESTRIL) 40 MG tablet Take 40 mg by mouth daily         Past History     Past Medical History:  Past Medical History:   Diagnosis Date   . Diabetes mellitus    . Gout    . Hypertension        Past Surgical History:  History reviewed. No pertinent surgical history.    Family History:  Family History   Problem Relation Age of Onset   . Diabetes Mother    . Hypertension Mother        Social History:  Social History   Substance Use Topics   . Smoking status: Never Smoker   . Smokeless tobacco: Never Used   . Alcohol use No       Allergies:  No Known Allergies    Review of Systems     Review of Systems   Constitutional: Negative.    HENT: Negative for congestion and nosebleeds.    Eyes: Negative for discharge and  redness.   Respiratory: Negative for cough, hemoptysis and sputum production.    Cardiovascular: Negative for chest pain and leg swelling.   Gastrointestinal: Positive for diarrhea and nausea. Negative for abdominal pain and vomiting.   Musculoskeletal: Positive for joint pain (left great toe). Negative for back pain and falls.   Skin: Negative.    Neurological: Negative for dizziness, seizures, loss of consciousness and headaches.   Psychiatric/Behavioral: Negative for suicidal ideas.       Physical Exam   BP 140/74   Pulse 90   Temp 98.6 F (37 C) (Oral)   Resp 16   Ht 5\' 9"  (1.753 m)   Wt 97.5 kg   SpO2 98%   BMI 31.75 kg/m     Physical Exam   Constitutional: He is oriented to person, place, and time. He appears well-developed and well-nourished. No distress.   HENT:   Head: Normocephalic and atraumatic.   Eyes: Pupils are equal, round, and reactive to light. Conjunctivae and EOM are normal.  Neck: Normal range of motion. Neck supple.   Cardiovascular: Normal rate and regular rhythm.    Pulmonary/Chest: Effort normal. No respiratory distress.   Musculoskeletal: Normal range of motion.        Left foot: There is bony tenderness and swelling. There is normal range of motion, normal capillary refill, no crepitus, no deformity and no laceration.        Feet:    There is no wound or cellulitis. Left great toe tenderness at the MTPJ. Distal NMV status is intact.    Neurological: He is alert and oriented to person, place, and time. He is not disoriented. Coordination and gait normal. GCS eye subscore is 4. GCS verbal subscore is 5. GCS motor subscore is 6.   Skin: Skin is warm and dry.   Psychiatric: He has a normal mood and affect. His behavior is normal. Judgment and thought content normal.   Nursing note and vitals reviewed.        Medical Decision Making   I am the first provider for this patient.    I reviewed today's vital signs and ED nursing notes.    Vital Signs-Reviewed the patient's vital signs.      Patient Vitals for the past 12 hrs:   BP Temp Pulse Resp   08/17/17 1057 140/74 98.6 F (37 C) 90 16       Pulse Oximetry Analysis - Normal, 98% on RA    Provider Notes:      Diagnosis     Clinical Impression:   1. Gouty arthritis of left great toe        _______________________________           Fransisca Connors, PA  08/17/17 1940       Westley Foots, MD  08/18/17 0600

## 2017-08-17 NOTE — Discharge Instructions (Signed)
Gout     You have been diagnosed with gout.     Gout is a very common disease. It is caused by an inflammatory response to crystals in the joint. The big toe is the most commonly affected area. The usual symptoms include redness and pain. A gout attack may be brought on by certain foods or medicines.     A short run of NSAID (Non-Steroidal Anti-Inflammatory Drug) treatment usually works well for this disease. If the symptoms don't go away, you may need a different medicine. You may also need to be placed on a long-term medicine to prevent symptoms. Follow up with your regular doctor for this.     YOU SHOULD SEEK MEDICAL ATTENTION IMMEDIATELY, EITHER HERE OR AT THE NEAREST EMERGENCY DEPARTMENT, IF ANY OF THE FOLLOWING OCCURS:  · Fever (temperature higher than 100.4°F / 38°C).  · Red streaks outside the area of pain into the extremity (foot or arm).  · Pain not helped by the medicine.               Gout Diet (Edu)     Your doctor wanted you to have the following information about a diet that can lessen your gout symptoms...      If you have gout, then your body moves uric acid to the joints of your body. This can be very painful. Uric acid is formed when your body breaks down a chemical called purine. Your body gets a lot of purine from the food you eat. So, eating food that is low in purine and uric acid can lessen the chance of having a gout attack.     Tips for a diet that keeps gout attacks from happening…     · Losing weight decreases the chance of having a gout attack. So, you should eat a diet that helps you lose weight, such as a low-calorie diet.      · Dehydration can also set off a gout attack. So, you should drink more water. You should also drink less soda, juice, and other sugary drinks.     · You should also eat complex carbohydrates such as whole wheat, unprocessed sugars, whole grains, and brown rice.     · You should not drink too much alcohol. This includes beer and wine.     · You should eat less  red meat. Also, you should not eat organ meats, such as liver and kidneys.     · You might want to drink more coffee. (According to some studies, coffee can lower your uric acid level.)     · You should eat a diet with a lot of vitamin C.     · You should drink dairy products that are low in fat, such as 2% or skim milk.

## 2017-08-31 ENCOUNTER — Emergency Department
Admission: EM | Admit: 2017-08-31 | Discharge: 2017-08-31 | Disposition: A | Discharge status: Home / Self Care | Payer: No Typology Code available for payment source | Attending: Emergency Medicine | Admitting: Emergency Medicine

## 2017-08-31 DIAGNOSIS — Z794 Long term (current) use of insulin: Secondary | ICD-10-CM | POA: Insufficient documentation

## 2017-08-31 DIAGNOSIS — M109 Gout, unspecified: Secondary | ICD-10-CM | POA: Insufficient documentation

## 2017-08-31 DIAGNOSIS — E119 Type 2 diabetes mellitus without complications: Secondary | ICD-10-CM | POA: Insufficient documentation

## 2017-08-31 DIAGNOSIS — I10 Essential (primary) hypertension: Secondary | ICD-10-CM | POA: Insufficient documentation

## 2017-08-31 MED ORDER — KETOROLAC TROMETHAMINE 30 MG/ML IJ SOLN
30.00 mg | Freq: Once | INTRAMUSCULAR | Status: AC
Start: 2017-08-31 — End: 2017-08-31
  Administered 2017-08-31: 10:00:00 30 mg via INTRAMUSCULAR
  Filled 2017-08-31: qty 1

## 2017-08-31 MED ORDER — DEXAMETHASONE SODIUM PHOSPHATE 4 MG/ML IJ SOLN (WRAP)
8.00 mg | Freq: Once | INTRAMUSCULAR | Status: AC
Start: 2017-08-31 — End: 2017-08-31
  Administered 2017-08-31: 10:00:00 8 mg via INTRAMUSCULAR
  Filled 2017-08-31: qty 5

## 2017-08-31 NOTE — ED Provider Notes (Signed)
EMERGENCY DEPARTMENT HISTORY AND PHYSICAL EXAM     Physician/Midlevel provider first contact with patient: 08/31/17 0836         Date: 08/31/2017  Patient Name: [name]    History of Presenting Illness     Chief Complaint   Patient presents with   . Gout       History Provided By: Patient    Chief Complaint: Toe pain   Duration: 3 weeks ago  Timing:  Worsening  Location: L great toe  Quality: uncomfortable  Severity: Moderate  Exacerbating factors: walking    Alleviating factors: Tried Tylenol 3 and colchicine w/ minimal relief.   Associated Symptoms: Ankle pain, knee pain, and joint swelling  Pertinent Negatives: Denies fever    Additional History: [name] is a 52 y.o. male w/ h/o DM, HTN, and gout presenting to the ED with 3 week h/o worsening L great toe, L ankle pain, and L knee pain and swelling which worsened 3 days ago and is consistent w/ prior gout flares. He denies fever. Pt tried Tylenol 3 and colchicine w/ minimal relief.     PCP: Pcp, [facility], [place]  SPECIALISTS:    No current facility-administered medications for this encounter.      Current Outpatient Prescriptions   Medication Sig Dispense Refill   . colchicine 0.6 MG tablet Take 0.6 mg by mouth daily     . Empagliflozin-metFORMIN HCl (SYNJARDY) 12.07-998 MG Tab Take by mouth     . insulin glargine (TOUJEO MAX SOLOSTAR) 300 UNIT/ML injection pen Inject into the skin nightly     . liraglutide (VICTOZA) 18 MG/3ML injection Inject into the skin     . lisinopril (PRINIVIL,ZESTRIL) 40 MG tablet Take 40 mg by mouth daily         Past History     Past Medical History:  Past Medical History:   Diagnosis Date   . Diabetes mellitus    . Gout    . Hypertension        Past Surgical History:  History reviewed. No pertinent surgical history.    Family History:  Family History   Problem Relation Age of Onset   . Diabetes Mother    . Hypertension Mother        Social History:  Social History   Substance Use Topics   . Smoking status: Never  Smoker   . Smokeless tobacco: Never Used   . Alcohol use No       Allergies:  No Known Allergies    Review of Systems     Review of Systems   Constitutional: Negative for fever.   Musculoskeletal:        Great toe, knee, ankle pain.        Physical Exam   BP 145/83   Pulse 96   Temp 97.7 F (36.5 C) (Oral)   Resp 16   Ht 5\' 7"  (1.702 m)   Wt 97.5 kg   SpO2 99%   BMI 33.67 kg/m     Physical Exam   Constitutional: He is oriented to person, place, and time. He appears well-developed and well-nourished.   HENT:   Head: Normocephalic and atraumatic.   Eyes: Pupils are equal, round, and reactive to light. EOM are normal.   Neck: Normal range of motion. Neck supple.   Pulmonary/Chest: Effort normal.   Musculoskeletal: Normal range of motion. He exhibits tenderness.   Swelling and mild erythema over L first MTP w/ tenderness. Mild L  ankle and L knee tenderness.    Neurological: He is alert and oriented to person, place, and time.   Skin: Skin is warm and dry. There is erythema.   Psychiatric: He has a normal mood and affect. His behavior is normal.   Nursing note and vitals reviewed.      Diagnostic Study Results     Labs -     Results     ** No results found for the last 24 hours. **          Radiologic Studies -   Radiology Results (24 Hour)     ** No results found for the last 24 hours. **      .    Medical Decision Making   I am the first provider for this patient.    I reviewed the vital signs, available nursing notes, past medical history, past surgical history, family history and social history.    Vital Signs-Reviewed the patient's vital signs.     Patient Vitals for the past 12 hrs:   BP Temp Pulse Resp   08/31/17 0826 145/83 97.7 F (36.5 C) 96 16       Pulse Oximetry Analysis - Normal 99% on RA    Old Medical Records: Nursing notes.     ED Course:     9:19 AM - Discussed plan for Toradol and Decadron in ED--pt requests both, but declines any medications to go home on. States "this usually works."  Discussed f/u with PCP, home self care, discharge instructions, and return precautions with patient. Possibility of evolving illness reviewed. All questions solicited and addressed. Patient states understanding and amenable to discharge.      Provider Notes:   52 y.o. M with L 1st MTP pain, c/w prior gout flares. Pt requests steroid and toradol shots, declines futher medications. No wounds to feet, no signs of joint infection. All questions answered.      Dr. Ander Slade is the primary emergency physician of record.      Diagnosis     Clinical Impression:   1. Gouty arthritis of left great toe        Treatment Plan:   ED Disposition     ED Disposition Condition Date/Time Comment    Discharge  Wed Aug 31, 2017  9:24 AM [name] discharge to home/self care.    Condition at disposition: Stable            _______________________________      Attestations: This note is prepared by Dillard Essex, acting as scribe for Catalina Pizza, MD.    Catalina Pizza, MD - The scribe's documentation has been prepared under my direction and personally reviewed by me in its entirety.  I confirm that the note above accurately reflects all work, treatment, procedures, and medical decision making performed by me.    _______________________________     Kari Baars, MD  09/01/17 1124

## 2017-08-31 NOTE — EDIE (Signed)
COLLECTIVE?NOTIFICATION?08/31/2017 08:08?[name], [name]?MRN: [id number]    Criteria Met      5 + ED Visits in 12 Months    Security and Safety  No recent Security Events currently on file    ED Care Guidelines  There are currently no ED Care Guidelines for this patient. Please check your facility's medical records system.          E.D. Visit Count (12 mo.)  Facility Visits   [place] [facility]: [facility] [place] 4   [facility] 1   Total 5   Note: Visits indicate total known visits.      Recent Emergency Department Visit Summary  Date Facility [facility] Type Diagnoses or Chief Complaint   Aug 31, 2017 [place] [facility]: [facility] [place] [facility]      Gout      Aug 17, 2017 [place] [facility]: [facility] [place] [facility]      left foot pain      Gout, unspecified      Jul 26, 2017 [place] [facility]: [facility] [employer] [place] [facility]      RT Shoulder Pain      Shoulder Pain      Pain in right shoulder      Jul 19, 2017 [place] [facility]: [facility]. [place] [facility]      RT Shoulder Injury      Shoulder Pain      Pain in right shoulder      Jul 05, 2017  - [place] [facility]  Emergency      R Shoulder Pain      Rash      Shoulder Pain      Pain in right shoulder      Type 2 diabetes mellitus with hyperglycemia      Long term (current) use of insulin          Recent Inpatient Visit Summary  No recorded inpatient visits.     Care Providers  There are no care providers on record at this time.   Collective Portal  This patient has registered at the [facility] Emergency Room: [facility] Emergency Department   For more information visit: [email or web address]     PLEASE NOTE:    1.   Any care recommendations and other clinical information are provided as guidelines or for historical purposes  only, and providers should exercise their own clinical judgment when providing care.    2.   You may only use this information for purposes of treatment, payment or health care operations activities, and subject to the limitations of applicable Collective Policies.    3.   You should consult directly with the organization that provided a care guideline or other clinical history with any questions about additional information or accuracy or completeness of information provided.    ? 2019 [place]. - [email or web address]

## 2017-08-31 NOTE — ED Triage Notes (Signed)
See quick triage

## 2017-08-31 NOTE — Discharge Instructions (Signed)
Gout     You have been diagnosed with gout.     Gout is a very common disease. It is caused by an inflammatory response to crystals in the joint. The big toe is the most commonly affected area. The usual symptoms include redness and pain. A gout attack may be brought on by certain foods or medicines.     A short run of NSAID (Non-Steroidal Anti-Inflammatory Drug) treatment usually works well for this disease. If the symptoms don't go away, you may need a different medicine. You may also need to be placed on a long-term medicine to prevent symptoms. Follow up with your regular doctor for this.     YOU SHOULD SEEK MEDICAL ATTENTION IMMEDIATELY, EITHER HERE OR AT THE NEAREST EMERGENCY DEPARTMENT, IF ANY OF THE FOLLOWING OCCURS:  · Fever (temperature higher than 100.4°F / 38°C).  · Red streaks outside the area of pain into the extremity (foot or arm).  · Pain not helped by the medicine.               Gout Diet (Edu)     Your doctor wanted you to have the following information about a diet that can lessen your gout symptoms...      If you have gout, then your body moves uric acid to the joints of your body. This can be very painful. Uric acid is formed when your body breaks down a chemical called purine. Your body gets a lot of purine from the food you eat. So, eating food that is low in purine and uric acid can lessen the chance of having a gout attack.     Tips for a diet that keeps gout attacks from happening…     · Losing weight decreases the chance of having a gout attack. So, you should eat a diet that helps you lose weight, such as a low-calorie diet.      · Dehydration can also set off a gout attack. So, you should drink more water. You should also drink less soda, juice, and other sugary drinks.     · You should also eat complex carbohydrates such as whole wheat, unprocessed sugars, whole grains, and brown rice.     · You should not drink too much alcohol. This includes beer and wine.     · You should eat less  red meat. Also, you should not eat organ meats, such as liver and kidneys.     · You might want to drink more coffee. (According to some studies, coffee can lower your uric acid level.)     · You should eat a diet with a lot of vitamin C.     · You should drink dairy products that are low in fat, such as 2% or skim milk.

## 2017-09-12 ENCOUNTER — Emergency Department
Admission: EM | Admit: 2017-09-12 | Discharge: 2017-09-12 | Disposition: A | Discharge status: Home / Self Care | Payer: No Typology Code available for payment source | Attending: Emergency Medicine | Admitting: Emergency Medicine

## 2017-09-12 DIAGNOSIS — I10 Essential (primary) hypertension: Secondary | ICD-10-CM | POA: Insufficient documentation

## 2017-09-12 DIAGNOSIS — Z79899 Other long term (current) drug therapy: Secondary | ICD-10-CM | POA: Insufficient documentation

## 2017-09-12 DIAGNOSIS — M109 Gout, unspecified: Secondary | ICD-10-CM | POA: Insufficient documentation

## 2017-09-12 DIAGNOSIS — Z794 Long term (current) use of insulin: Secondary | ICD-10-CM | POA: Insufficient documentation

## 2017-09-12 DIAGNOSIS — E119 Type 2 diabetes mellitus without complications: Secondary | ICD-10-CM | POA: Insufficient documentation

## 2017-09-12 DIAGNOSIS — M10461 Other secondary gout, right knee: Secondary | ICD-10-CM

## 2017-09-12 MED ORDER — PREDNISONE 10 MG PO TABS
60.00 mg | ORAL_TABLET | Freq: Once | ORAL | Status: AC
Start: 2017-09-12 — End: 2017-09-12
  Administered 2017-09-12: 12:00:00 60 mg via ORAL
  Filled 2017-09-12: qty 4

## 2017-09-12 MED ORDER — KETOROLAC TROMETHAMINE 30 MG/ML IJ SOLN
30.00 mg | Freq: Once | INTRAMUSCULAR | Status: AC
Start: 2017-09-12 — End: 2017-09-12
  Administered 2017-09-12: 12:00:00 30 mg via INTRAMUSCULAR

## 2017-09-12 MED ORDER — PREDNISONE 20 MG PO TABS
60.00 mg | ORAL_TABLET | Freq: Every day | ORAL | 0 refills | Status: DC
Start: 2017-09-12 — End: 2017-09-17

## 2017-09-12 MED ORDER — KETOROLAC TROMETHAMINE 30 MG/ML IJ SOLN
30.00 mg | Freq: Once | INTRAMUSCULAR | Status: DC
Start: 2017-09-12 — End: 2017-09-12
  Filled 2017-09-12: qty 1

## 2017-09-12 NOTE — ED Provider Notes (Signed)
EMERGENCY DEPARTMENT HISTORY AND PHYSICAL EXAM     Physician/Midlevel provider first contact with patient: 09/12/17 1058         Date: 09/12/2017  Patient Name: [name]  Attending Physician: Donny Pique, MD  Advanced Practice Provider: Rykker Coviello T Taige Housman    History of Presenting Illness       History Provided By: Patient    Chief Complaint:  Chief Complaint   Patient presents with   . Gout     Onset: 1 week  Timing: gradual  Location: L great toe  Quality: tender, swollen  Severity: mild  Exacerbating factors: direct touch  Alleviating factors: none  Associated Symptoms: see ROS  Pertinent Negatives: see ROS    Additional History: [name] is a 52 y.o. male presenting to the ED with gouty flare of his L great toe for past 1 week ago. Pt states his PCP in [place] usually gives a "shot of morphine and a shot of steroids IM that usually takes care of the pain" [facility]     PCP: Pcp, [facility], MD  SPECIALISTS:    No current facility-administered medications for this encounter.      Current Outpatient Prescriptions   Medication Sig Dispense Refill   . colchicine 0.6 MG tablet Take 0.6 mg by mouth daily     . Empagliflozin-metFORMIN HCl (SYNJARDY) 12.07-998 MG Tab Take by mouth     . insulin glargine (TOUJEO MAX SOLOSTAR) 300 UNIT/ML injection pen Inject into the skin nightly     . liraglutide (VICTOZA) 18 MG/3ML injection Inject into the skin     . lisinopril (PRINIVIL,ZESTRIL) 40 MG tablet Take 40 mg by mouth daily     . predniSONE (DELTASONE) 20 MG tablet Take 3 tablets (60 mg total) by mouth daily for 5 days 15 tablet 0       Past History     Past Medical History:  Past Medical History:   Diagnosis Date   . Diabetes mellitus    . Gout    . Hypertension        Past Surgical History:  History reviewed. No pertinent surgical history.    Family History:  Family History   Problem Relation Age of Onset   . Diabetes Mother    . Hypertension Mother         Social History:  Social History     Social History   . Marital status: Single     Spouse name: N/A   . Number of children: N/A   . Years of education: N/A     Social History Main Topics   . Smoking status: Never Smoker   . Smokeless tobacco: Never Used   . Alcohol use No   . Drug use: No   . Sexual activity: Not Asked     Other Topics Concern   . None     Social History Narrative   . None       Allergies:  No Known Allergies    Review of Systems     Review of Systems   Constitutional: Negative for chills and fever.   HENT: Negative for sinus pain.    Respiratory: Negative for cough.    Cardiovascular: Negative for chest pain.   Gastrointestinal: Negative for nausea and vomiting.   Musculoskeletal: Positive for joint pain. Negative for back pain and neck pain.   Skin: Negative for rash.   Neurological:  Negative for dizziness, tingling and headaches.       Physical Exam     Vitals:    09/12/17 0946   BP: 127/68   Pulse: 85   Resp: 16   Temp: 97.2 F (36.2 C)   TempSrc: Oral   SpO2: 100%   Weight: 95.3 kg   Height: 5\' 7"  (1.702 m)       Physical Exam   Constitutional: He is oriented to person, place, and time. He appears well-developed and well-nourished.   HENT:   Right Ear: Tympanic membrane normal.   Left Ear: Tympanic membrane normal.   Nose: Nose normal.   Mouth/Throat: Uvula is midline, oropharynx is clear and moist and mucous membranes are normal.   Eyes: Conjunctivae are normal. No scleral icterus.   Neck: Normal range of motion.   Cardiovascular: Normal rate, normal heart sounds and intact distal pulses.    Pulmonary/Chest: Effort normal and breath sounds normal. No stridor.   Abdominal: Soft. There is no tenderness.   Musculoskeletal: Normal range of motion.   L great toe MTP joint with tenderness and swelling. No redness or lesions. No cellulitis.    Neurological: He is alert and oriented to person, place, and time. He has normal strength. GCS eye subscore is 4. GCS verbal subscore is 5. GCS motor  subscore is 6.   Skin: Skin is warm.   Psychiatric: He has a normal mood and affect.   Nursing note and vitals reviewed.      Diagnostic Study Results     Labs -     Results     ** No results found for the last 24 hours. **          Radiologic Studies -   Radiology Results (24 Hour)     ** No results found for the last 24 hours. **      .    Medical Decision Making   I am the first provider for this patient.    I reviewed the vital signs, available nursing notes, past medical history, past surgical history, family history and social history.    Vital Signs-Reviewed the patient's vital signs.     Patient Vitals for the past 12 hrs:   BP Temp Pulse Resp   09/12/17 0946 127/68 97.2 F (36.2 C) 85 16       Pulse Oximetry Analysis - Normal 100% on RA      Procedures:   Procedures    Old Medical Records: Old medical records.  Nursing notes.     ED Course:      11:11 AM - pt requests shots of morphine and steroid shot as this is what his other providers give him. Pt states his last flare was 1 year ago however his chart says otherwise.     11:18 AM - Pt states he was sent home with 10 mg steroids for 5 days but I do not see evidence of said Rx in the chart.     11:42 AM - Pt is feeling better. Discussed test results with pt and counseled on diagnosis, f/u plans, medication use, and signs and symptoms that would prompt a return to the ED. Pt expressed understanding and is stable and ready for discharge. Pt discharged with PMD follow up.     Provider Notes:    52 y.o. male with gout flare of L great toe. Prednisone for 5 days. Recent treatment 2 weeks ago w recurrent flare. F/u with your PCP in  2-3 days. Strict return precautions reviewed, pt verbalized understanding and agrees w the plan.     D/w Donny Pique, MD    Discharge Prescriptions     Medication Sig Dispense Auth. Provider    predniSONE (DELTASONE) 20 MG tablet Take 3 tablets (60 mg total) by mouth daily for 5 days 15 tablet Kinsler Soeder, Theora Gianotti, FNP           Diagnosis     Clinical Impression:   1. Acute gout involving toe of left foot, unspecified cause    2. Other secondary gout, right knee        Treatment Plan:   ED Disposition     ED Disposition Condition Date/Time Comment    Discharge  Mon Sep 12, 2017 11:42 AM [name] discharge to home/self care.    Condition at disposition: Stable            _______________________________    CHART OWNERSHIPSharen Heck FNP-C am the primary clinician of record.  _______________________________       Nils Flack, FNP  09/15/17 0703       Donny Pique, MD  09/15/17 [phone]

## 2017-09-12 NOTE — Discharge Instructions (Signed)
Check your BS three times per day for the next 5 days. F/u with your PCP    Gout    You have been diagnosed with gout.    Gout is a very common disease. It is caused by an inflammatory response to crystals in the joint. The big toe is the most commonly affected area. The usual symptoms include redness and pain. A gout attack may be brought on by certain foods or medicines.    A short run of NSAID (Non-Steroidal Anti-Inflammatory Drug) treatment usually works well for this disease. If the symptoms don't go away, you may need a different medicine. You may also need to be placed on a long-term medicine to prevent symptoms. Follow up with your regular doctor for this.    YOU SHOULD SEEK MEDICAL ATTENTION IMMEDIATELY, EITHER HERE OR AT THE NEAREST EMERGENCY DEPARTMENT, IF ANY OF THE FOLLOWING OCCURS:   Fever (temperature higher than 100.35F / 38C).   Red streaks outside the area of pain into the extremity (foot or arm).   Pain not helped by the medicine.               Gout Diet (Edu)    Your doctor wanted you to have the following information about a diet that can lessen your gout symptoms...     If you have gout, then your body moves uric acid to the joints of your body. This can be very painful. Uric acid is formed when your body breaks down a chemical called purine. Your body gets a lot of purine from the food you eat. So, eating food that is low in purine and uric acid can lessen the chance of having a gout attack.    Tips for a diet that keeps gout attacks from happening.     Losing weight decreases the chance of having a gout attack. So, you should eat a diet that helps you lose weight, such as a low-calorie diet.      Dehydration can also set off a gout attack. So, you should drink more water. You should also drink less soda, juice, and other sugary drinks.     You should also eat complex carbohydrates such as whole wheat, unprocessed sugars, whole grains, and brown rice.     You should not  drink too much alcohol. This includes beer and wine.     You should eat less red meat. Also, you should not eat organ meats, such as liver and kidneys.     You might want to drink more coffee. (According to some studies, coffee can lower your uric acid level.)     You should eat a diet with a lot of vitamin C.     You should drink dairy products that are low in fat, such as 2% or skim milk.

## 2017-09-12 NOTE — ED Triage Notes (Signed)
Pt c/o right knee and left big toe gout flare up starting one week ago.Pt states eating crab legs about one week ago and is not on any gout medication.

## 2017-09-12 NOTE — EDIE (Signed)
COLLECTIVE?NOTIFICATION?09/12/2017 09:43?HARI, CASAUS B?MRN: [id number]    Criteria Met      5 ED Visits in 12 Months    Security and Safety  No recent Security Events currently on file    ED Care Guidelines  There are currently no ED Care Guidelines for this patient. Please check your facility's medical records system.      Prescription Monitoring Program  120??- Narcotic Use Score  060??- Sedative Use Score  000??- Stimulant Use Score  200??- Overdose Risk Score  - All Scores range from 000-999 with 75% of the population scoring < 200 and on 1% scoring above 650  - The last digit of the narcotic, sedative, and stimulant score indicates the number of active prescriptions of that type  - Higher Use scores correlate with increased prescribers, pharmacies, mg equiv, and overlapping prescriptions  - Higher Overdose Risk Scores correlate with increased risk of unintentional overdose death   Concerning or unexpectedly high scores should prompt a review of the PMP record; this does not constitute checking PMP for prescribing purposes.      E.D. Visit Count (12 mo.)  Facility Visits   [place] [facility]: [facility] [place] 4   [facility] 2   Total 6   Note: Visits indicate total known visits.      Recent Emergency Department Visit Summary  Date Facility [facility] Type Diagnoses or Chief Complaint   Sep 12, 2017 [place] [facility] [place] [facility] [place] [facility]      Goat      Aug 31, 2017 [place] [facility]. [place] [facility]      Gout      Gout, unspecified      Aug 17, 2017 [place] [facility]. [place] [facility]      left foot pain      Gout, unspecified      Jul 26, 2017 [place] [facility]: [facility] [employer] [facility]. [place] [facility]      RT Shoulder Pain      Shoulder Pain      Pain in right shoulder      Jul 19, 2017 [place] [facility]. [place] [facility]      RT  Shoulder Injury      Shoulder Pain      Pain in right shoulder      Jul 05, 2017 [place] [facility] [place] [facility] [place] [facility]      R Shoulder Pain      Rash      Shoulder Pain      Pain in right shoulder      Type 2 diabetes mellitus with hyperglycemia      Long term (current) use of insulin          Recent Inpatient Visit Summary  No recorded inpatient visits.     Care Providers  There are no care providers on record at this time.   Collective Portal  This patient has registered at the [facility] Emergency Department   For more information visit: [email or web address]     PLEASE NOTE:    1.   Any care recommendations and other clinical information are provided as guidelines or for historical purposes only, and providers should exercise their own clinical judgment when providing care.    2.   You may only use this information for purposes of treatment, payment or health care operations activities, and subject to the limitations of applicable  Collective Policies.    3.   You should consult directly with the organization that provided a care guideline or other clinical history with any questions about additional information or accuracy or completeness of information provided.    ? 2019 [employer] - https://craig.com/

## 2017-09-12 NOTE — ED Notes (Signed)
Patient went to get food from cafeteria when he came back he demanded that he would be next. Patient ambulated to cafeteria. Patient seems to be in no distress.

## 2017-09-17 ENCOUNTER — Emergency Department: Payer: No Typology Code available for payment source

## 2017-09-17 ENCOUNTER — Emergency Department
Admission: EM | Admit: 2017-09-17 | Discharge: 2017-09-17 | Disposition: A | Discharge status: Home / Self Care | Payer: No Typology Code available for payment source | Attending: Emergency Medicine | Admitting: Emergency Medicine

## 2017-09-17 DIAGNOSIS — M10071 Idiopathic gout, right ankle and foot: Secondary | ICD-10-CM | POA: Insufficient documentation

## 2017-09-17 DIAGNOSIS — E119 Type 2 diabetes mellitus without complications: Secondary | ICD-10-CM | POA: Insufficient documentation

## 2017-09-17 DIAGNOSIS — Z794 Long term (current) use of insulin: Secondary | ICD-10-CM | POA: Insufficient documentation

## 2017-09-17 DIAGNOSIS — Z79899 Other long term (current) drug therapy: Secondary | ICD-10-CM | POA: Insufficient documentation

## 2017-09-17 DIAGNOSIS — I10 Essential (primary) hypertension: Secondary | ICD-10-CM | POA: Insufficient documentation

## 2017-09-17 LAB — COMPREHENSIVE METABOLIC PANEL
ALT: 15 U/L (ref 0–55)
AST (SGOT): 12 U/L (ref 5–34)
Albumin/Globulin Ratio: 1 (ref 0.9–2.2)
Albumin: 3.8 g/dL (ref 3.5–5.0)
Alkaline Phosphatase: 82 U/L (ref 38–106)
Anion Gap: 12 (ref 5.0–15.0)
BUN: 33 mg/dL — ABNORMAL HIGH (ref 9.0–28.0)
Bilirubin, Total: 0.4 mg/dL (ref 0.2–1.2)
CO2: 23 mEq/L (ref 22–29)
Calcium: 9.8 mg/dL (ref 8.5–10.5)
Chloride: 101 mEq/L (ref 100–111)
Creatinine: 1.4 mg/dL — ABNORMAL HIGH (ref 0.7–1.3)
Globulin: 4 g/dL — ABNORMAL HIGH (ref 2.0–3.6)
Glucose: 188 mg/dL — ABNORMAL HIGH (ref 70–100)
Potassium: 4.3 mEq/L (ref 3.5–5.1)
Protein, Total: 7.8 g/dL (ref 6.0–8.3)
Sodium: 136 mEq/L (ref 136–145)

## 2017-09-17 LAB — CBC AND DIFFERENTIAL
Absolute NRBC: 0 10*3/uL (ref 0.00–0.00)
Basophils Absolute Automated: 0.05 10*3/uL (ref 0.00–0.08)
Basophils Automated: 0.7 %
Eosinophils Absolute Automated: 0.14 10*3/uL (ref 0.00–0.44)
Eosinophils Automated: 1.9 %
Hematocrit: 46.5 % (ref 37.6–49.6)
Hgb: 13.9 g/dL (ref 12.5–17.1)
Immature Granulocytes Absolute: 0.05 10*3/uL (ref 0.00–0.07)
Immature Granulocytes: 0.7 %
Lymphocytes Absolute Automated: 3.16 10*3/uL (ref 0.42–3.22)
Lymphocytes Automated: 42.6 %
MCH: 26.7 pg (ref 25.1–33.5)
MCHC: 29.9 g/dL — ABNORMAL LOW (ref 31.5–35.8)
MCV: 89.4 fL (ref 78.0–96.0)
MPV: 9.5 fL (ref 8.9–12.5)
Monocytes Absolute Automated: 0.52 10*3/uL (ref 0.21–0.85)
Monocytes: 7 %
Neutrophils Absolute: 3.5 10*3/uL (ref 1.10–6.33)
Neutrophils: 47.1 %
Nucleated RBC: 0 /100 WBC (ref 0.0–0.0)
Platelets: 294 10*3/uL (ref 142–346)
RBC: 5.2 10*6/uL (ref 4.20–5.90)
RDW: 13 % (ref 11–15)
WBC: 7.42 10*3/uL (ref 3.10–9.50)

## 2017-09-17 LAB — GFR: EGFR: >60

## 2017-09-17 MED ORDER — OXYCODONE-ACETAMINOPHEN 5-325 MG PO TABS
1.00 | ORAL_TABLET | Freq: Once | ORAL | Status: AC
Start: 2017-09-17 — End: 2017-09-17
  Administered 2017-09-17: 18:00:00 1 via ORAL
  Filled 2017-09-17: qty 1

## 2017-09-17 MED ORDER — PREDNISONE 20 MG PO TABS
40.00 mg | ORAL_TABLET | Freq: Every day | ORAL | 0 refills | Status: AC
Start: 2017-09-17 — End: 2017-09-21

## 2017-09-17 MED ORDER — FENTANYL CITRATE (PF) 50 MCG/ML IJ SOLN (WRAP)
50.00 ug | Freq: Once | INTRAMUSCULAR | Status: AC
Start: 2017-09-17 — End: 2017-09-17
  Administered 2017-09-17: 17:00:00 50 ug via INTRAVENOUS
  Filled 2017-09-17: qty 2

## 2017-09-17 MED ORDER — METHYLPREDNISOLONE SODIUM SUCC 125 MG IJ SOLR
80.00 mg | Freq: Once | INTRAMUSCULAR | Status: AC
Start: 2017-09-17 — End: 2017-09-17
  Administered 2017-09-17: 17:00:00 80 mg via INTRAVENOUS
  Filled 2017-09-17: qty 2

## 2017-09-17 MED ORDER — HYDROCODONE-ACETAMINOPHEN 5-325 MG PO TABS
1.00 | ORAL_TABLET | Freq: Four times a day (QID) | ORAL | 0 refills | Status: DC | PRN
Start: 2017-09-17 — End: 2017-10-18

## 2017-09-17 MED ORDER — SODIUM CHLORIDE 0.9 % IV BOLUS
1000.00 mL | Freq: Once | INTRAVENOUS | Status: AC
Start: 2017-09-17 — End: 2017-09-17
  Administered 2017-09-17: 17:00:00 1000 mL via INTRAVENOUS

## 2017-09-17 NOTE — EDIE (Signed)
COLLECTIVE?NOTIFICATION?09/17/2017 15:22?[name], [name] B?MRN: [id number]    Criteria Met      5 + ED Visits in 12 Months    Security and Safety  No recent Security Events currently on file    ED Care Guidelines  There are currently no ED Care Guidelines for this patient. Please check your facility's medical records system.          E.D. Visit Count (12 mo.)  Facility Visits   [place] [facility]: [facility] [place] 5   [facility] 2   Total 7   Note: Visits indicate total known visits.      Recent Emergency Department Visit Summary  Date Facility [facility] Type Diagnoses or Chief Complaint   Sep 17, 2017 [place] [facility]. [place] [facility]      Back of Right Foot Pain      Sep 12, 2017 [place] [facility] [place] [facility] [place] [facility]      Goat      Gout      Other secondary gout, right knee      Gout, unspecified      Aug 31, 2017 [place] [facility] [place] [facility] [place] [facility]      Gout      Gout, unspecified      Aug 17, 2017 [place] [facility]: [facility]. [place] [facility]      left foot pain      Gout, unspecified      Jul 26, 2017 [place] [facility]: [facility] [employer] [facility]. [place] [facility]      RT Shoulder Pain      Shoulder Pain      Pain in right shoulder      Jul 19, 2017 [place] [facility]: [facility]. [place] [facility]      RT Shoulder Injury      Shoulder Pain      Pain in right shoulder      Jul 05, 2017 [place] [facility] [place] [facility] [place] [facility]      R Shoulder Pain      Rash      Shoulder Pain      Pain in right shoulder      Type 2 diabetes mellitus with hyperglycemia      Long term (current) use of insulin          Recent Inpatient Visit Summary  No recorded inpatient visits.     Care Providers  There are no care providers on record at this time.   Collective Portal  This patient has registered at the [facility] Emergency Room: [facility] Emergency Department   For more information visit: [email or web address]     PLEASE NOTE:    1.   Any care recommendations and other clinical information are provided as guidelines or for historical purposes only, and providers should exercise their own clinical judgment when providing care.    2.   You may only use this information for purposes of treatment, payment or health care operations activities, and subject to the limitations of applicable Collective Policies.    3.   You should consult directly with the organization that provided a care guideline or other clinical history with any questions about additional information or accuracy or completeness of information provided.    ? 2019 [place] - [employer]

## 2017-09-17 NOTE — ED Triage Notes (Signed)
See quick note

## 2017-09-17 NOTE — Discharge Instructions (Signed)
Gout     You have been diagnosed with gout.     Gout is a very common disease. It is caused by an inflammatory response to crystals in the joint. The big toe is the most commonly affected area. The usual symptoms include redness and pain. A gout attack may be brought on by certain foods or medicines.     A short run of NSAID (Non-Steroidal Anti-Inflammatory Drug) treatment usually works well for this disease. If the symptoms don't go away, you may need a different medicine. You may also need to be placed on a long-term medicine to prevent symptoms. Follow up with your regular doctor for this.     YOU SHOULD SEEK MEDICAL ATTENTION IMMEDIATELY, EITHER HERE OR AT THE NEAREST EMERGENCY DEPARTMENT, IF ANY OF THE FOLLOWING OCCURS:  · Fever (temperature higher than 100.4°F / 38°C).  · Red streaks outside the area of pain into the extremity (foot or arm).  · Pain not helped by the medicine.               Gout Diet (Edu)     Your doctor wanted you to have the following information about a diet that can lessen your gout symptoms...      If you have gout, then your body moves uric acid to the joints of your body. This can be very painful. Uric acid is formed when your body breaks down a chemical called purine. Your body gets a lot of purine from the food you eat. So, eating food that is low in purine and uric acid can lessen the chance of having a gout attack.     Tips for a diet that keeps gout attacks from happening…     · Losing weight decreases the chance of having a gout attack. So, you should eat a diet that helps you lose weight, such as a low-calorie diet.      · Dehydration can also set off a gout attack. So, you should drink more water. You should also drink less soda, juice, and other sugary drinks.     · You should also eat complex carbohydrates such as whole wheat, unprocessed sugars, whole grains, and brown rice.     · You should not drink too much alcohol. This includes beer and wine.     · You should eat less  red meat. Also, you should not eat organ meats, such as liver and kidneys.     · You might want to drink more coffee. (According to some studies, coffee can lower your uric acid level.)     · You should eat a diet with a lot of vitamin C.     · You should drink dairy products that are low in fat, such as 2% or skim milk.

## 2017-09-19 NOTE — ED Provider Notes (Signed)
Physician/Midlevel provider first contact with patient: 09/17/17 1555         History     Chief Complaint   Patient presents with   . Foot Pain     52 YO male presents for evaluation of right heel pain beginning in the last few days.  There has been no injury.  It is similar to gout attacks he's had in the past, but he has not had them in this location.  He is already taking colchicine for right MTP pain.      His symptoms began after consuming crab.  There is no associated fever.  No other systemic symptoms.       The history is provided by the patient. No language interpreter was used.   Foot Pain              Past Medical History:   Diagnosis Date   . Diabetes mellitus    . Gout    . Hypertension        History reviewed. No pertinent surgical history.    Family History   Problem Relation Age of Onset   . Diabetes Mother    . Hypertension Mother        Social  Social History   Substance Use Topics   . Smoking status: Never Smoker   . Smokeless tobacco: Never Used   . Alcohol use No       .     No Known Allergies    Home Medications     Med List Status:  Complete Set By: Guido Sander, RN at 09/17/2017  3:47 PM                colchicine 0.6 MG tablet     Take 0.6 mg by mouth daily     Empagliflozin-metFORMIN HCl (SYNJARDY) 12.07-998 MG Tab     Take by mouth     insulin glargine (TOUJEO MAX SOLOSTAR) 300 UNIT/ML injection pen     Inject into the skin nightly     liraglutide (VICTOZA) 18 MG/3ML injection     Inject into the skin     lisinopril (PRINIVIL,ZESTRIL) 40 MG tablet     Take 40 mg by mouth daily                     Review of Systems   Constitutional: Negative.    Musculoskeletal:        Right heel pain   Skin: Negative.    Neurological:        No numbness or weakness of the area.   All other systems reviewed and are negative.      Physical Exam    BP: 118/64, Heart Rate: 93, Temp: 98.4 F (36.9 C), Resp Rate: 18, SpO2: 99 %, Weight: 97.5 kg    Physical Exam   Constitutional: He appears well-developed and  well-nourished. No distress.   HENT:   Head: Normocephalic and atraumatic.   Eyes: Conjunctivae are normal. Right eye exhibits no discharge. Left eye exhibits no discharge. No scleral icterus.   Musculoskeletal:   Right foot:  Trace swelling of medial ankle and medial heel with considerable tenderness to palpation.  No redness, increased warmth, proximal streaking.  Rest of foot is NT to palpation.     Neurological: He is alert. He exhibits normal muscle tone.   Skin: Skin is warm and dry. Capillary refill takes less than 2 seconds. No rash noted. He is not diaphoretic. No  erythema. No pallor.   Psychiatric: He has a normal mood and affect. Thought content normal.   Nursing note and vitals reviewed.        MDM and ED Course     ED Medication Orders     Start Ordered     Status Ordering Provider    09/17/17 1757 09/17/17 1756  oxyCODONE-acetaminophen (PERCOCET) 5-325 MG per tablet 1 tablet  Once     Route: Oral  Ordered Dose: 1 tablet     Last MAR action:  Given Alverda Nazzaro    09/17/17 1603 09/17/17 1602  sodium chloride 0.9 % bolus 1,000 mL  Once     Route: Intravenous  Ordered Dose: 1,000 mL     Last MAR action:  Stopped Taryne Kiger    09/17/17 1603 09/17/17 1602  methylPREDNISolone sodium succinate (Solu-MEDROL) injection 80 mg  Once     Route: Intravenous  Ordered Dose: 80 mg     Last MAR action:  Given Dezirea Mccollister    09/17/17 1603 09/17/17 1602  fentaNYL (PF) (SUBLIMAZE) injection 50 mcg  Once     Route: Intravenous  Ordered Dose: 50 mcg     Last MAR action:  Given Tinsleigh Slovacek             MDM    Pain medication added to patient's current meds.  He is to f/u with PCP this week.  Return prn worsening or new symptoms.              Procedures    Clinical Impression & Disposition     Clinical Impression  Final diagnoses:   Acute idiopathic gout of right ankle        ED Disposition     ED Disposition Condition Date/Time Comment    Discharge  Sat Sep 17, 2017  5:59 PM Karl Luke Vanosdol discharge to  home/self care.    Condition at disposition: Stable           Discharge Medication List as of 09/17/2017  5:59 PM      START taking these medications    Details   HYDROcodone-acetaminophen (NORCO) 5-325 MG per tablet Take 1 tablet by mouth every 6 (six) hours as needed for Pain, Starting Sat 09/17/2017, Print                       [place], [place], NP  09/19/17 1610       Darcus Pester, MD  09/21/17 [phone]

## 2017-10-03 ENCOUNTER — Emergency Department
Admission: EM | Admit: 2017-10-03 | Discharge: 2017-10-03 | Disposition: A | Discharge status: Home / Self Care | Payer: No Typology Code available for payment source | Attending: Emergency Medicine | Admitting: Emergency Medicine

## 2017-10-03 DIAGNOSIS — M109 Gout, unspecified: Secondary | ICD-10-CM | POA: Insufficient documentation

## 2017-10-03 DIAGNOSIS — Z794 Long term (current) use of insulin: Secondary | ICD-10-CM | POA: Insufficient documentation

## 2017-10-03 DIAGNOSIS — E119 Type 2 diabetes mellitus without complications: Secondary | ICD-10-CM | POA: Insufficient documentation

## 2017-10-03 DIAGNOSIS — Z79899 Other long term (current) drug therapy: Secondary | ICD-10-CM | POA: Insufficient documentation

## 2017-10-03 DIAGNOSIS — I10 Essential (primary) hypertension: Secondary | ICD-10-CM | POA: Insufficient documentation

## 2017-10-03 MED ORDER — KETOROLAC TROMETHAMINE 30 MG/ML IJ SOLN
30.00 mg | Freq: Once | INTRAMUSCULAR | Status: AC
Start: 2017-10-03 — End: 2017-10-03
  Administered 2017-10-03: 19:00:00 30 mg via INTRAMUSCULAR
  Filled 2017-10-03: qty 1

## 2017-10-03 MED ORDER — MELOXICAM 7.5 MG PO TABS
15.00 mg | ORAL_TABLET | Freq: Every day | ORAL | 0 refills | Status: AC
Start: 2017-10-03 — End: ?

## 2017-10-03 MED ORDER — COLCHICINE 0.6 MG PO TABS
ORAL_TABLET | ORAL | 0 refills | Status: DC
Start: 2017-10-03 — End: 2017-10-18

## 2017-10-03 NOTE — ED Provider Notes (Signed)
EMERGENCY DEPARTMENT NOTE    Physician/Midlevel provider first contact with patient: 10/03/17 1840         HISTORY OF PRESENT ILLNESS   Historian: Patient  Translator Used: No    Chief Complaint: Gout     Mechanism of Injury:       52 y.o. male presents emergency department with a chief complaint of bilateral knee pain.  Patient was previously seen at [facility] for gout and was given colchicine but reports his pain continues.  Patient reports that he has tried some previously given prednisone without relief.  Patient reports that he works on [employer] and is on his feet constantly and has had some trouble walking and standing because of his gout.  He states that this feels like his previously diagnosed episodes of gout.  He denies any fevers.  No chest pain or shortness of breath.  He has no other complaints at this time.    1. Location of symptoms: Bilateral knees  2. Onset of symptoms: Past couple of days  3. What was patient doing when symptoms started (Context): see above  4. Severity: Moderate to severe  5. Timing: Ongoing  6. Activities that worsen symptoms: Walking or bearing weight  7. Activities that improve symptoms: No  8. Quality: Achy  9. Radiation of symptoms: No  10. Associated signs and Symptoms: see above  11. Are symptoms worsening?  Yes  MEDICAL HISTORY     Past Medical History:  Past Medical History:   Diagnosis Date   . Diabetes mellitus    . Gout    . Hypertension        Past Surgical History:  History reviewed. No pertinent surgical history.    Social History:  Social History     Social History   . Marital status: Single     Spouse name: N/A   . Number of children: N/A   . Years of education: N/A     Occupational History   . Not on file.     Social History Main Topics   . Smoking status: Never Smoker   . Smokeless tobacco: Never Used   . Alcohol use No   . Drug use: No   . Sexual activity: Not on file     Other Topics Concern   . Not on file     Social History Narrative   . No  narrative on file       Family History:  Family History   Problem Relation Age of Onset   . Diabetes Mother    . Hypertension Mother        Outpatient Medication:  Discharge Medication List as of 10/03/2017  7:32 PM      CONTINUE these medications which have NOT CHANGED    Details   Empagliflozin-metFORMIN HCl (SYNJARDY) 12.07-998 MG Tab Take by mouth, Historical Med      HYDROcodone-acetaminophen (NORCO) 5-325 MG per tablet Take 1 tablet by mouth every 6 (six) hours as needed for Pain, Starting Sat 09/17/2017, Print      insulin glargine (TOUJEO MAX SOLOSTAR) 300 UNIT/ML injection pen Inject into the skin nightly, Historical Med      liraglutide (VICTOZA) 18 MG/3ML injection Inject into the skin, Historical Med      lisinopril (PRINIVIL,ZESTRIL) 40 MG tablet Take 40 mg by mouth daily, Historical Med               REVIEW OF SYSTEMS   Review of Systems  Constitutional: Negative for fever.   HENT: Negative for sore throat.    Eyes: Negative for discharge and redness.   Respiratory: Negative for cough and shortness of breath.    Cardiovascular: Negative for chest pain.   Gastrointestinal: Negative for abdominal pain, diarrhea, nausea and vomiting.   Musculoskeletal: Positive for joint pain. Negative for falls and neck pain.   Skin: Negative for rash.   Neurological: Negative for dizziness, sensory change, speech change, focal weakness, seizures, loss of consciousness and headaches.   Psychiatric/Behavioral: Negative for memory loss.   All other systems reviewed and are negative.             PHYSICAL EXAM     ED Triage Vitals [10/03/17 1757]   Enc Vitals Group      BP 113/72      Heart Rate 88      Resp Rate 16      Temp 98.2 F (36.8 C)      Temp Source Temporal Art      SpO2 100 %      Weight 97.5 kg      Height 1.702 m      Head Circumference       Peak Flow       Pain Score 9      Pain Loc       Pain Edu?       Excl. in GC?      Nursing note and vitals reviewed.  Constitutional:  Well developed, well nourished. alert  and awake  Head:  Atraumatic. Normocephalic.    Eyes:  PERRL. EOMI. No scleral icterus  ENT:  Mucous membranes are moist and intact. Oropharynx is clear.  External ears normal. Patent airway.  Neck:  Supple. Full ROM.    Cardiovascular:  Regular rate. Regular rhythm. No murmurs, rubs, or gallops.  Pulmonary/Chest:  No evidence of respiratory distress. Clear to auscultation bilaterally.  No wheezing, rales or rhonchi.   Abdominal:  Soft and non-distended. No tenderness. No rebound, guarding, or rigidity.  Back:  Full ROM. Nontender.   Extremities:  No edema.  Patient with bilateral knee tenderness.  No significant swelling or warmth noted.  Patient does have slight tophi noted on the anterior portion of the left knee.  Able to bear weight but with antalgic gait.  Slight decrease in range of motion of bilateral knees secondary to pain.    Skin:  Skin is warm and dry.  No diaphoresis. No rash.   Neurological:  Alert, awake, and appropriate. Normal speech. Motor grossly normal. Cranial Nerves grossly intact by observation.   Psychiatric:  Good eye contact. Normal interaction, affect, and behavior.          MEDICAL DECISION MAKING     DISCUSSION    Patient presents emergency department for evaluation of gout.  He states that this feels like his previous episodes of gout.  He has had gout in his knees before.  Is possible that this current episode is gout but also could be arthritis especially with bilateral knee involvement.  Patient will be given appropriate NSAIDs with caution because his last laboratory studies showed elevation of creatinine just over baseline.  No need for x-rays at this time.  Patient was given appropriate return precautions and follow-up instructions.  Stable for outpatient management of symptoms.  Patient agrees with disposition and plan.  He was given Toradol in the emergency department for relief of his pain.  As he states that he has  had this before in the past and this is helped.      Vital  Signs: Reviewed the patient?s vital signs.   Nursing Notes: Reviewed and utilized available nursing notes.  Medical Records Reviewed: Reviewed available past medical records.  Counseling: The emergency provider has spoken with the patient and discussed today?s findings, in addition to providing specific details for the plan of care.  Questions are answered and there is agreement with the plan.      RADIOLOGY IMAGING STUDIES      No orders to display           PULSE OXIMETRY    Oxygen Saturation by Pulse Oximetry: 100%  Interventions: none  Interpretation:  normal    EMERGENCY DEPT. MEDICATIONS      ED Medication Orders     Start Ordered     Status Ordering Provider    10/03/17 1854 10/03/17 1853  ketorolac (TORADOL) injection 30 mg  Once     Route: Intramuscular  Ordered Dose: 30 mg     Last MAR action:  Given Keshonna Valvo M          LABORATORY RESULTS    Ordered and independently interpreted AVAILABLE laboratory tests. Please see results section in chart for full details.      CRITICAL CARE/PROCEDURES    Procedures      DIAGNOSIS      Diagnosis:  Final diagnoses:   Gouty arthritis of both knees       Disposition:  ED Disposition     ED Disposition Condition Date/Time Comment    Discharge  Mon Oct 03, 2017  7:32 PM [name] discharge to home/self care.    Condition at disposition: Stable          Prescriptions:  Discharge Medication List as of 10/03/2017  7:32 PM      START taking these medications    Details   meloxicam (MOBIC) 7.5 MG tablet Take 2 tablets (15 mg total) by mouth daily, Starting Mon 10/03/2017, Normal         CONTINUE these medications which have CHANGED    Details   colchicine 0.6 MG tablet 2 tabs by mouth for the 1st dose, then 1 tab 1 hour later. Starting day 2 may take 1 tab one or two times daily as tolerated for gout flare., Normal         CONTINUE these medications which have NOT CHANGED    Details   Empagliflozin-metFORMIN HCl (SYNJARDY) 12.07-998 MG Tab Take by mouth, Historical Med       HYDROcodone-acetaminophen (NORCO) 5-325 MG per tablet Take 1 tablet by mouth every 6 (six) hours as needed for Pain, Starting Sat 09/17/2017, Print      insulin glargine (TOUJEO MAX SOLOSTAR) 300 UNIT/ML injection pen Inject into the skin nightly, Historical Med      liraglutide (VICTOZA) 18 MG/3ML injection Inject into the skin, Historical Med      lisinopril (PRINIVIL,ZESTRIL) 40 MG tablet Take 40 mg by mouth daily, Historical Med                  Arther Abbott, DO  10/05/17 1113

## 2017-10-03 NOTE — Discharge Instructions (Signed)
Gout     You have been diagnosed with gout.     Gout is a very common disease. It is caused by an inflammatory response to crystals in the joint. The big toe is the most commonly affected area. The usual symptoms include redness and pain. A gout attack may be brought on by certain foods or medicines.     A short run of NSAID (Non-Steroidal Anti-Inflammatory Drug) treatment usually works well for this disease. If the symptoms don't go away, you may need a different medicine. You may also need to be placed on a long-term medicine to prevent symptoms. Follow up with your regular doctor for this.     YOU SHOULD SEEK MEDICAL ATTENTION IMMEDIATELY, EITHER HERE OR AT THE NEAREST EMERGENCY DEPARTMENT, IF ANY OF THE FOLLOWING OCCURS:  · Fever (temperature higher than 100.4°F / 38°C).  · Red streaks outside the area of pain into the extremity (foot or arm).  · Pain not helped by the medicine.               Gout Diet (Edu)     Your doctor wanted you to have the following information about a diet that can lessen your gout symptoms...      If you have gout, then your body moves uric acid to the joints of your body. This can be very painful. Uric acid is formed when your body breaks down a chemical called purine. Your body gets a lot of purine from the food you eat. So, eating food that is low in purine and uric acid can lessen the chance of having a gout attack.     Tips for a diet that keeps gout attacks from happening…     · Losing weight decreases the chance of having a gout attack. So, you should eat a diet that helps you lose weight, such as a low-calorie diet.      · Dehydration can also set off a gout attack. So, you should drink more water. You should also drink less soda, juice, and other sugary drinks.     · You should also eat complex carbohydrates such as whole wheat, unprocessed sugars, whole grains, and brown rice.     · You should not drink too much alcohol. This includes beer and wine.     · You should eat less  red meat. Also, you should not eat organ meats, such as liver and kidneys.     · You might want to drink more coffee. (According to some studies, coffee can lower your uric acid level.)     · You should eat a diet with a lot of vitamin C.     · You should drink dairy products that are low in fat, such as 2% or skim milk.

## 2017-10-03 NOTE — EDIE (Signed)
COLLECTIVE?NOTIFICATION?10/03/2017 17:42?[name], [name]?MRN: [id number]    [place] [facility]'s patient encounter information:   VWU:?[id number]  Account [id number]  Billing Account [id number]      Criteria Met      3 Different Facilities in 90 Days    5 ED Visits in 12 Months    Security and Safety  No recent Security Events currently on file    ED Care Guidelines  There are currently no ED Care Guidelines for this patient. Please check your facility's medical records system.      Prescription Monitoring Program  150??- Narcotic Use Score  090??- Sedative Use Score  000??- Stimulant Use Score  300??- Overdose Risk Score  - All Scores range from 000-999 with 75% of the population scoring < 200 and on 1% scoring above 650  - The last digit of the narcotic, sedative, and stimulant score indicates the number of active prescriptions of that type  - Higher Use scores correlate with increased prescribers, pharmacies, mg equiv, and overlapping prescriptions  - Higher Overdose Risk Scores correlate with increased risk of unintentional overdose death   Concerning or unexpectedly high scores should prompt a review of the PMP record; this does not constitute checking PMP for prescribing purposes.      E.D. Visit Count (12 mo.)  Facility Visits   [place] [facility]: [facility] 5   [place] [facility] 1   [place] [facility] 2   Total 8   Note: Visits indicate total known visits.      Recent Emergency Department Visit Summary  Date Facility [facility] Type Diagnoses or Chief Complaint   Oct 03, 2017 [place] [facility] [place] [facility] [place] [facility]      Knee Pain      Sep 17, 2017 [place] [facility] [place] [facility]      Back of Right Foot Pain      Foot Pain      Idiopathic gout, right ankle and foot      Sep 12, 2017 [place] [facility] [place] [facility] [place] [facility]      Goat      Gout      Other secondary gout, right knee       Gout, unspecified      Aug 31, 2017 [place] [facility]. [place] [facility]      Gout      Gout, unspecified      Aug 17, 2017 [place] [facility] [place] [facility]      left foot pain      Gout, unspecified      Jul 26, 2017 [place] [facility] [place] [facility]      RT Shoulder Pain      Shoulder Pain      Pain in right shoulder      Jul 19, 2017 [place] [facility] [place] [facility]      RT Shoulder Injury      Shoulder Pain      Pain in right shoulder      Jul 05, 2017 [place] [facility] [place] [facility] [place] [facility]      R Shoulder Pain      Rash      Shoulder Pain      Pain in right shoulder      Type 2 diabetes mellitus with hyperglycemia      Long term (  current) use of insulin          Recent Inpatient Visit Summary  No recorded inpatient visits.     Care Team  There are no care providers on record at this time.   Collective Portal  This patient has registered at the [facility] Department   For more information visit: [email or web address]     PLEASE NOTE:    1.   Any care recommendations and other clinical information are provided as guidelines or for historical purposes only, and providers should exercise their own clinical judgment when providing care.    2.   You may only use this information for purposes of treatment, payment or health care operations activities, and subject to the limitations of applicable Collective Policies.    3.   You should consult directly with the organization that provided a care guideline or other clinical history with any questions about additional information or accuracy or completeness of information provided.    ? 2019 [place], [employer] - [email or web address]

## 2017-10-12 ENCOUNTER — Observation Stay: Payer: No Typology Code available for payment source

## 2017-10-12 ENCOUNTER — Inpatient Hospital Stay
Admission: EM | Admit: 2017-10-12 | Discharge: 2017-10-18 | DRG: 064 | Disposition: A | Discharge status: Home / Self Care | Payer: No Typology Code available for payment source | Attending: Internal Medicine | Admitting: Internal Medicine

## 2017-10-12 ENCOUNTER — Emergency Department: Payer: No Typology Code available for payment source

## 2017-10-12 DIAGNOSIS — R233 Spontaneous ecchymoses: Secondary | ICD-10-CM | POA: Diagnosis present

## 2017-10-12 DIAGNOSIS — Z79899 Other long term (current) drug therapy: Secondary | ICD-10-CM

## 2017-10-12 DIAGNOSIS — R278 Other lack of coordination: Secondary | ICD-10-CM | POA: Diagnosis present

## 2017-10-12 DIAGNOSIS — I651 Occlusion and stenosis of basilar artery: Secondary | ICD-10-CM | POA: Diagnosis present

## 2017-10-12 DIAGNOSIS — I639 Cerebral infarction, unspecified: Principal | ICD-10-CM | POA: Diagnosis present

## 2017-10-12 DIAGNOSIS — H5500 Unspecified nystagmus: Secondary | ICD-10-CM | POA: Diagnosis present

## 2017-10-12 DIAGNOSIS — Z794 Long term (current) use of insulin: Secondary | ICD-10-CM

## 2017-10-12 DIAGNOSIS — Z8249 Family history of ischemic heart disease and other diseases of the circulatory system: Secondary | ICD-10-CM

## 2017-10-12 DIAGNOSIS — R519 Headache, unspecified: Secondary | ICD-10-CM | POA: Diagnosis present

## 2017-10-12 DIAGNOSIS — Z8679 Personal history of other diseases of the circulatory system: Secondary | ICD-10-CM

## 2017-10-12 DIAGNOSIS — Z833 Family history of diabetes mellitus: Secondary | ICD-10-CM

## 2017-10-12 DIAGNOSIS — M109 Gout, unspecified: Secondary | ICD-10-CM | POA: Diagnosis present

## 2017-10-12 DIAGNOSIS — I10 Essential (primary) hypertension: Secondary | ICD-10-CM | POA: Diagnosis present

## 2017-10-12 DIAGNOSIS — R51 Headache: Secondary | ICD-10-CM | POA: Diagnosis present

## 2017-10-12 DIAGNOSIS — H40059 Ocular hypertension, unspecified eye: Secondary | ICD-10-CM | POA: Diagnosis present

## 2017-10-12 DIAGNOSIS — E1165 Type 2 diabetes mellitus with hyperglycemia: Secondary | ICD-10-CM | POA: Diagnosis present

## 2017-10-12 DIAGNOSIS — R2689 Other abnormalities of gait and mobility: Secondary | ICD-10-CM

## 2017-10-12 DIAGNOSIS — R269 Unspecified abnormalities of gait and mobility: Secondary | ICD-10-CM

## 2017-10-12 DIAGNOSIS — G936 Cerebral edema: Secondary | ICD-10-CM | POA: Diagnosis present

## 2017-10-12 LAB — GLUCOSE WHOLE BLOOD - POCT
Whole Blood Glucose POCT: 266 mg/dL — ABNORMAL HIGH (ref 70–100)
Whole Blood Glucose POCT: 269 mg/dL — ABNORMAL HIGH (ref 70–100)

## 2017-10-12 LAB — ECG 12-LEAD
Atrial Rate: 100 {beats}/min
P Axis: 75 degrees
P-R Interval: 140 ms
Q-T Interval: 334 ms
QRS Duration: 98 ms
QTC Calculation (Bezet): 430 ms
R Axis: 88 degrees
T Axis: -70 degrees
Ventricular Rate: 100 {beats}/min

## 2017-10-12 LAB — URINALYSIS REFLEX TO MICROSCOPIC EXAM - REFLEX TO CULTURE
Bilirubin, UA: NEGATIVE
Blood, UA: NEGATIVE
Glucose, UA: 500 — AB
Ketones UA: NEGATIVE
Leukocyte Esterase, UA: NEGATIVE
Nitrite, UA: NEGATIVE
Protein, UR: NEGATIVE
Specific Gravity UA: 1.026 (ref 1.001–1.035)
Urine pH: 6 (ref 5.0–8.0)
Urobilinogen, UA: NEGATIVE mg/dL

## 2017-10-12 LAB — CBC AND DIFFERENTIAL
Absolute NRBC: 0 10*3/uL (ref 0.00–0.00)
Basophils Absolute Automated: 0.04 10*3/uL (ref 0.00–0.08)
Basophils Automated: 0.6 %
Eosinophils Absolute Automated: 0.16 10*3/uL (ref 0.00–0.44)
Eosinophils Automated: 2.4 %
Hematocrit: 49.7 % — ABNORMAL HIGH (ref 37.6–49.6)
Hgb: 14.8 g/dL (ref 12.5–17.1)
Immature Granulocytes Absolute: 0.02 10*3/uL (ref 0.00–0.07)
Immature Granulocytes: 0.3 %
Lymphocytes Absolute Automated: 2.41 10*3/uL (ref 0.42–3.22)
Lymphocytes Automated: 35.9 %
MCH: 26.6 pg (ref 25.1–33.5)
MCHC: 29.8 g/dL — ABNORMAL LOW (ref 31.5–35.8)
MCV: 89.2 fL (ref 78.0–96.0)
MPV: 9.5 fL (ref 8.9–12.5)
Monocytes Absolute Automated: 0.56 10*3/uL (ref 0.21–0.85)
Monocytes: 8.3 %
Neutrophils Absolute: 3.52 10*3/uL (ref 1.10–6.33)
Neutrophils: 52.5 %
Nucleated RBC: 0 /100 WBC (ref 0.0–0.0)
Platelets: 271 10*3/uL (ref 142–346)
RBC: 5.57 10*6/uL (ref 4.20–5.90)
RDW: 13 % (ref 11–15)
WBC: 6.71 10*3/uL (ref 3.10–9.50)

## 2017-10-12 LAB — COMPREHENSIVE METABOLIC PANEL
ALT: 17 U/L (ref 0–55)
AST (SGOT): 15 U/L (ref 5–34)
Albumin/Globulin Ratio: 1 (ref 0.9–2.2)
Albumin: 3.9 g/dL (ref 3.5–5.0)
Alkaline Phosphatase: 78 U/L (ref 38–106)
Anion Gap: 13 (ref 5.0–15.0)
BUN: 28 mg/dL (ref 9.0–28.0)
Bilirubin, Total: 0.5 mg/dL (ref 0.2–1.2)
CO2: 24 mEq/L (ref 22–29)
Calcium: 10 mg/dL (ref 8.5–10.5)
Chloride: 101 mEq/L (ref 100–111)
Creatinine: 1.2 mg/dL (ref 0.7–1.3)
Globulin: 3.9 g/dL — ABNORMAL HIGH (ref 2.0–3.6)
Glucose: 361 mg/dL — ABNORMAL HIGH (ref 70–100)
Potassium: 4.8 mEq/L (ref 3.5–5.1)
Protein, Total: 7.8 g/dL (ref 6.0–8.3)
Sodium: 138 mEq/L (ref 136–145)

## 2017-10-12 LAB — GFR: EGFR: >60

## 2017-10-12 LAB — TROPONIN I: Troponin I: <0.01 ng/mL (ref 0.00–0.09)

## 2017-10-12 LAB — HEMOLYSIS INDEX: Hemolysis Index: 27 — ABNORMAL HIGH (ref 0–18)

## 2017-10-12 MED ORDER — BUTALBITAL-APAP-CAFFEINE 50-325-40 MG PO TABS
1.00 | ORAL_TABLET | Freq: Four times a day (QID) | ORAL | Status: DC | PRN
Start: 2017-10-12 — End: 2017-10-18
  Administered 2017-10-13 – 2017-10-17 (×3): 1 via ORAL
  Filled 2017-10-12 (×3): qty 1

## 2017-10-12 MED ORDER — NALOXONE HCL 0.4 MG/ML IJ SOLN (WRAP)
0.20 mg | INTRAMUSCULAR | Status: DC | PRN
Start: 2017-10-12 — End: 2017-10-18

## 2017-10-12 MED ORDER — ASPIRIN 81 MG PO CHEW
81.00 mg | CHEWABLE_TABLET | Freq: Every day | ORAL | Status: DC
Start: 2017-10-13 — End: 2017-10-18
  Administered 2017-10-13 – 2017-10-18 (×6): 81 mg via ORAL
  Filled 2017-10-12 (×6): qty 1

## 2017-10-12 MED ORDER — ONDANSETRON 4 MG PO TBDP
4.00 mg | ORAL_TABLET | Freq: Four times a day (QID) | ORAL | Status: DC | PRN
Start: 2017-10-12 — End: 2017-10-14

## 2017-10-12 MED ORDER — ZOLPIDEM TARTRATE 5 MG PO TABS
5.00 mg | ORAL_TABLET | Freq: Once | ORAL | Status: AC
Start: 2017-10-13 — End: 2017-10-13
  Administered 2017-10-13: 5 mg via ORAL
  Filled 2017-10-12: qty 1

## 2017-10-12 MED ORDER — ONDANSETRON HCL 4 MG/2ML IJ SOLN
4.00 mg | Freq: Four times a day (QID) | INTRAMUSCULAR | Status: DC | PRN
Start: 2017-10-12 — End: 2017-10-14
  Administered 2017-10-13 – 2017-10-14 (×4): 4 mg via INTRAVENOUS
  Filled 2017-10-12 (×4): qty 2

## 2017-10-12 MED ORDER — INSULIN LISPRO 100 UNIT/ML SC SOLN
1.00 [IU] | Freq: Every evening | SUBCUTANEOUS | Status: DC
Start: 2017-10-12 — End: 2017-10-18
  Administered 2017-10-12: 23:00:00 3 [IU] via SUBCUTANEOUS
  Administered 2017-10-13: 22:00:00 2 [IU] via SUBCUTANEOUS
  Administered 2017-10-14: 22:00:00 3 [IU] via SUBCUTANEOUS
  Administered 2017-10-15: 23:00:00 2 [IU] via SUBCUTANEOUS
  Administered 2017-10-16: 22:00:00 1 [IU] via SUBCUTANEOUS
  Administered 2017-10-17: 23:00:00 4 [IU] via SUBCUTANEOUS
  Filled 2017-10-12: qty 3
  Filled 2017-10-12: qty 9
  Filled 2017-10-12: qty 15
  Filled 2017-10-12: qty 12
  Filled 2017-10-12 (×2): qty 6
  Filled 2017-10-12: qty 9

## 2017-10-12 MED ORDER — LISINOPRIL 20 MG PO TABS
40.00 mg | ORAL_TABLET | Freq: Every day | ORAL | Status: DC
Start: 2017-10-12 — End: 2017-10-18
  Administered 2017-10-13 – 2017-10-18 (×6): 40 mg via ORAL
  Filled 2017-10-12 (×7): qty 2

## 2017-10-12 MED ORDER — SENNOSIDES-DOCUSATE SODIUM 8.6-50 MG PO TABS
2.00 | ORAL_TABLET | Freq: Every day | ORAL | Status: DC | PRN
Start: 2017-10-12 — End: 2017-10-18

## 2017-10-12 MED ORDER — GADOBUTROL 1 MMOL/ML IV SOLN
10.00 mL | Freq: Once | INTRAVENOUS | Status: AC | PRN
Start: 2017-10-12 — End: 2017-10-12
  Administered 2017-10-12: 21:00:00 10 mmol via INTRAVENOUS

## 2017-10-12 MED ORDER — MAGNESIUM SULFATE IN D5W 1-5 GM/100ML-% IV SOLN
1.00 g | Freq: Once | INTRAVENOUS | Status: AC
Start: 2017-10-12 — End: 2017-10-12
  Administered 2017-10-12: 08:00:00 1 g via INTRAVENOUS
  Filled 2017-10-12: qty 100

## 2017-10-12 MED ORDER — ATORVASTATIN CALCIUM 10 MG PO TABS
10.00 mg | ORAL_TABLET | Freq: Every evening | ORAL | Status: DC
Start: 2017-10-12 — End: 2017-10-13
  Administered 2017-10-12: 23:00:00 10 mg via ORAL
  Filled 2017-10-12: qty 1

## 2017-10-12 MED ORDER — ASPIRIN 81 MG PO CHEW
324.00 mg | CHEWABLE_TABLET | Freq: Once | ORAL | Status: AC
Start: 2017-10-12 — End: 2017-10-12
  Administered 2017-10-12: 10:00:00 324 mg via ORAL
  Filled 2017-10-12: qty 4

## 2017-10-12 MED ORDER — ASPIRIN 325 MG PO TBEC
325.00 mg | DELAYED_RELEASE_TABLET | Freq: Every day | ORAL | Status: DC
Start: 2017-10-13 — End: 2017-10-12

## 2017-10-12 MED ORDER — PROCHLORPERAZINE EDISYLATE 10 MG/2ML IJ SOLN
10.00 mg | Freq: Once | INTRAMUSCULAR | Status: AC
Start: 2017-10-12 — End: 2017-10-12
  Administered 2017-10-12: 08:00:00 10 mg via INTRAVENOUS
  Filled 2017-10-12: qty 2

## 2017-10-12 MED ORDER — SODIUM CHLORIDE 0.9 % IV SOLN
INTRAVENOUS | Status: DC
Start: 2017-10-12 — End: 2017-10-13

## 2017-10-12 MED ORDER — INSULIN LISPRO 100 UNIT/ML SC SOLN
1.00 [IU] | Freq: Three times a day (TID) | SUBCUTANEOUS | Status: DC
Start: 2017-10-12 — End: 2017-10-18
  Administered 2017-10-12 – 2017-10-13 (×2): 5 [IU] via SUBCUTANEOUS
  Administered 2017-10-13 – 2017-10-14 (×5): 3 [IU] via SUBCUTANEOUS
  Administered 2017-10-15: 12:00:00 5 [IU] via SUBCUTANEOUS
  Administered 2017-10-15: 17:00:00 3 [IU] via SUBCUTANEOUS
  Administered 2017-10-15: 08:00:00 1 [IU] via SUBCUTANEOUS
  Administered 2017-10-16: 15:00:00 3 [IU] via SUBCUTANEOUS
  Administered 2017-10-16: 17:00:00 7 [IU] via SUBCUTANEOUS
  Administered 2017-10-16: 10:00:00 3 [IU] via SUBCUTANEOUS
  Administered 2017-10-17: 17:00:00 8 [IU] via SUBCUTANEOUS
  Administered 2017-10-17: 12:00:00 5 [IU] via SUBCUTANEOUS
  Administered 2017-10-17: 08:00:00 3 [IU] via SUBCUTANEOUS
  Administered 2017-10-18: 08:00:00 5 [IU] via SUBCUTANEOUS
  Administered 2017-10-18: 11:00:00 8 [IU] via SUBCUTANEOUS
  Filled 2017-10-12: qty 3
  Filled 2017-10-12: qty 24
  Filled 2017-10-12: qty 6
  Filled 2017-10-12: qty 15
  Filled 2017-10-12: qty 9
  Filled 2017-10-12: qty 24
  Filled 2017-10-12 (×2): qty 9
  Filled 2017-10-12: qty 15
  Filled 2017-10-12 (×2): qty 9
  Filled 2017-10-12: qty 21
  Filled 2017-10-12 (×2): qty 9
  Filled 2017-10-12: qty 15
  Filled 2017-10-12 (×3): qty 9

## 2017-10-12 MED ORDER — LORAZEPAM 2 MG/ML IJ SOLN
0.50 mg | Freq: Once | INTRAMUSCULAR | Status: AC
Start: 2017-10-12 — End: 2017-10-12
  Administered 2017-10-12: 18:00:00 0.5 mg via INTRAVENOUS
  Filled 2017-10-12: qty 1

## 2017-10-12 MED ORDER — ACETAMINOPHEN 325 MG PO TABS
650.00 mg | ORAL_TABLET | ORAL | Status: DC | PRN
Start: 2017-10-12 — End: 2017-10-12

## 2017-10-12 MED ORDER — ACETAMINOPHEN 650 MG RE SUPP
650.00 mg | RECTAL | Status: DC | PRN
Start: 2017-10-12 — End: 2017-10-12

## 2017-10-12 MED ORDER — DEXAMETHASONE SOD PHOSPHATE PF 10 MG/ML IJ SOLN
10.00 mg | Freq: Once | INTRAMUSCULAR | Status: AC
Start: 2017-10-12 — End: 2017-10-12
  Administered 2017-10-12: 08:00:00 10 mg via INTRAVENOUS
  Filled 2017-10-12: qty 1

## 2017-10-12 MED ORDER — ENOXAPARIN SODIUM 40 MG/0.4ML SC SOLN
40.00 mg | Freq: Every day | SUBCUTANEOUS | Status: DC
Start: 2017-10-12 — End: 2017-10-18
  Administered 2017-10-12 – 2017-10-18 (×7): 40 mg via SUBCUTANEOUS
  Filled 2017-10-12 (×7): qty 0.4

## 2017-10-12 MED ORDER — ASPIRIN 325 MG PO TBEC
324.00 mg | DELAYED_RELEASE_TABLET | Freq: Every day | ORAL | Status: DC
Start: 2017-10-13 — End: 2017-10-12

## 2017-10-12 NOTE — Plan of Care (Signed)
Problem: Neurological Deficit  Goal: Neurological status is stable or improving  Outcome: Progressing   10/12/17 1717   Goal/Interventions addressed this shift   Neurological status is stable or improving Monitor/assess/document neurological assessment (Stroke: every 4 hours);Monitor/assess NIH Stroke Scale;Observe for seizure activity and initiate seizure precautions if indicated;Perform CAM Assessment       Comments: Patient is a new admission from the ED, alert and oriented x 4. Patient NIH upon admission to the unit is 0, pt passed bedside swallow. Pt/ot ordered due to patient stating he was feeling off balance but is steady on his feet upon ambulation to the bathroom. Patient has not complained of any headache but complains that he is feeling restless and uneasy.

## 2017-10-12 NOTE — Plan of Care (Signed)
Problem: Day of Admission - Stroke  Goal: Core/Quality measure requirements - Admission  Outcome: Progressing   10/12/17 2015   Goal/Interventions addressed this shift   Core/Quality measure requirements - Admission Document NIH Stroke Scale on admission;Document nursing swallow/dysphagia screen on admission. If patient fails, keep patient NPO (follow your hospital protocol on swallowing screening).;VTE Prevention: Ensure anticoagulant(s) administered and/or anti-embolism stockings/devices documented as ordered;Ensure antithrombotic administered or contraindication documented by LIP;If diagnosis or history of Atrial Fib/Atrial Flutter, ensure oral anticoagulation is initiated or contraindication documented by LIP;Ensure lipid panel ordered;Begin stroke education on admission (must include Modifiable Risk Factors, Warning Signs and Symptoms of Stroke, Activation of Emergency Medical System and Follow-up Appointments) Ensure handout has been given and documented.;Ensure PT/OT and/or SLP ordered

## 2017-10-12 NOTE — ED Notes (Signed)
[facility] ED NURSING NOTE FOR THE RECEIVING INPATIENT NURSE   ED NURSE Chena Chohan    [place] [phone]   ADMISSION INFORMATION   [name] is a 52 y.o. male admitted with a diagnosis of:    1. Acute nonintractable headache, unspecified headache type    2. Gait difficulty    3. Cerebrovascular accident (CVA), unspecified mechanism       Isolation None   Holding Orders confirmed? Yes   Belongings Documented? Yes   Home medications sent to pharmacy confirmed? No   NURSING CARE   Mental Status   alert and oriented   ADLs ADL   Independent with all ADLs    Ambulation no difficulty   Pertinent Information  and Safety Concerns none     CT / NIH   CT Head ordered on this patient?  Yes   NIH/Dysphagia assessment done prior to admission? Yes    VITAL SIGNS     Time of Last Set of Vitals:    917 Temperature 98.1    BP 132/84    Heart Rate 94    Respirations 15    Pulse Ox 96   IV LINES   IV Catheter Size: 20 g    Peripheral IV 10/12/17 Left Antecubital (Active)   Site Assessment Clean;Dry;Intact 10/12/2017  7:43 AM   Line Status Flushed;Blood return noted 10/12/2017  7:43 AM   Dressing Status Clean;Dry;Intact 10/12/2017  7:43 AM   Number of days: 0          LAB RESULTS   Labs Reviewed   CBC AND DIFFERENTIAL - Abnormal; Notable for the following:        Result Value    Hematocrit 49.7 (*)     MCHC 29.8 (*)     All other components within normal limits    Narrative:     Replace urinary catheter prior to obtaining the urine culture  if it has been in place for greater than or equal to 14  days:->N/A No Foley  Indications for U/A Reflex to Micro - Reflex to  Culture:->Suprapubic Pain/Tenderness or Dysuria   COMPREHENSIVE METABOLIC PANEL - Abnormal; Notable for the following:     Glucose 361 (*)     Globulin 3.9 (*)     All other components within normal limits    Narrative:     Replace urinary catheter prior to obtaining the urine culture  if it has been in place for greater than or equal to 14  days:->N/A No Foley  Indications for  U/A Reflex to Micro - Reflex to  Culture:->Suprapubic Pain/Tenderness or Dysuria   URINALYSIS REFLEX TO MICROSCOPIC EXAM - REFLEX TO CULTURE - Abnormal; Notable for the following:     Glucose, UA 500 (*)     All other components within normal limits    Narrative:     Replace urinary catheter prior to obtaining the urine culture  if it has been in place for greater than or equal to 14  days:->N/A No Foley  Indications for U/A Reflex to Micro - Reflex to  Culture:->Suprapubic Pain/Tenderness or Dysuria   HEMOLYSIS INDEX - Abnormal; Notable for the following:     Hemolysis Index 27 (*)     All other components within normal limits    Narrative:     Replace urinary catheter prior to obtaining the urine culture  if it has been in place for greater than or equal to 14  days:->N/A No Foley  Indications for  U/A Reflex to Micro - Reflex to  Culture:->Suprapubic Pain/Tenderness or Dysuria   TROPONIN I    Narrative:     Replace urinary catheter prior to obtaining the urine culture  if it has been in place for greater than or equal to 14  days:->N/A No Foley  Indications for U/A Reflex to Micro - Reflex to  Culture:->Suprapubic Pain/Tenderness or Dysuria   GFR    Narrative:     Replace urinary catheter prior to obtaining the urine culture  if it has been in place for greater than or equal to 14  days:->N/A No Foley  Indications for U/A Reflex to Micro - Reflex to  Culture:->Suprapubic Pain/Tenderness or Dysuria          (07/2017)

## 2017-10-12 NOTE — Consults (Signed)
[facility] Consultation Note                                       Date Time: 10/12/17 11:14 AM  Patient Name: [name],[name]  Requesting Physician: Berenice Bouton, MD  Date of Admission: 10/12/2017    CC / Reason for Consultation: imbalance, blurry vision       Assessment:   This is a 52 y/o male with a PMH significant for HTN and DM 2 who presents with HA, blurry vision, and imbalance since x 4 days    1.  HA, blurry vision, dizziness, and imbalance x 4 days. HCT reveals L cerebellar hypodensity, concerning for stroke which would fit clinical picture. Mechanism unclear, possibly thrombotic 2/2 small vessel occlusion though embolic sources must be ruled out           Plan:   -Recommend MRI brain, MRA head/neck  -Recommend TTE   -Continue baby ASA for now  -Check lipid panel, HbA1c  -No role for permissive HTN as symptoms are 4 days and stroke completed. Aim for normotension  -Tight BG control  -Telemetry   -PT/OT/Speech  -Medical management per primary team  -Further recommendations pending results of above      Attending Physician Attestation:   The patient was seen and examined by me. History was independently reviewed and confirmed by me. All pertinent parts of the neurological exam we performed and confirmed by me, with any additional findings on exam noted in bold. I agree with the assessment and plan as outlined by the advanced practice provider as above, with any additional considerations or recommendations as noted:     51 yo M with h/o T2DM, HTN, with onset of headache, blurred vision, unsteady gait on Sunday.  Evaluated at OSH and per his report, had normal head CT and discharged home.  Presented to our ED due to persistence of symptoms.  Exam with left beating nystagmus, left rebound, mild left sided dysmetria.  Imaging with left cerebellar lesion c/w evolving infarct.  No significant mass effect on fourth ventricle.  Recommend admission for stroke work up, modification of  stroke risk factors.     De Hollingshead, MD  [facility]  Board Certified in Adult Neurology by the American Board of Psychiatry and Neurology (ABPN)  Board Certified in Headache Medicine by the [employer] (UCNS)  10/12/2017          HPI   [name] is a 52 y.o. male with a PMH significant for HTN and DM 2 who presents with HA, blurry vision, and imbalance since x 4 days. Pt states on Sunday he was experiencing HA, blurry vision, and imbalance prompting him to seek medical attention. Pt was seen at ER in [place] for which he states the HCT was "negative" (records not available for viewing). Pt was diagnosed with migraine and subsequently discharged. Pt states symptoms persisted prompting him to come back to the ED. In the ED, not tPA candidate 2/2 outside window. HCT revealed  L cerebellar hypodensity, concerning for stroke. Pt does not take ASA or other blood thinners regularly. He follows with a PCP regularly. Denies drug/alcohol use, HA, neck pain, paresthesias, confusion, facial droop, or N/V.         Past Medical Hx     Past Medical History:   Diagnosis Date   . Diabetes mellitus    .  Gout    . Hypertension           Past Surgical Hx:   History reviewed. No pertinent surgical history.     Family Medical History:      Family History   Problem Relation Age of Onset   . Diabetes Mother    . Hypertension Mother    . Hypertension Father    . Diabetes Father        Social Hx     Social History     Social History   . Marital status: Single     Spouse name: N/A   . Number of children: N/A   . Years of education: N/A     Occupational History   . Not on file.     Social History Main Topics   . Smoking status: Never Smoker   . Smokeless tobacco: Never Used   . Alcohol use No   . Drug use: No   . Sexual activity: Not on file     Other Topics Concern   . Not on file     Social History Narrative   . No narrative on file       Meds     Home :   Prior to Admission medications    Medication  Sig Start Date End Date Taking? Authorizing Provider   colchicine 0.6 MG tablet 2 tabs by mouth for the 1st dose, then 1 tab 1 hour later. Starting day 2 may take 1 tab one or two times daily as tolerated for gout flare. 10/03/17   Arther Abbott, DO   Empagliflozin-metFORMIN HCl (SYNJARDY) 12.07-998 MG Tab Take by mouth    [provider]   HYDROcodone-acetaminophen (NORCO) 5-325 MG per tablet Take 1 tablet by mouth every 6 (six) hours as needed for Pain 09/17/17   Lars Pinks, NP   insulin glargine (TOUJEO MAX SOLOSTAR) 300 UNIT/ML injection pen Inject into the skin nightly    [provider]   liraglutide (VICTOZA) 18 MG/3ML injection Inject into the skin    [provider]   lisinopril (PRINIVIL,ZESTRIL) 40 MG tablet Take 40 mg by mouth daily    [provider]   meloxicam (MOBIC) 7.5 MG tablet Take 2 tablets (15 mg total) by mouth daily 10/03/17   Arther Abbott, DO      Inpatient :   Current Facility-Administered Medications   Medication Dose Route Frequency   . [START ON 10/13/2017] aspirin EC  325 mg Oral Daily   . atorvastatin  10 mg Oral QHS   . enoxaparin  40 mg Subcutaneous Daily   . insulin lispro  1-4 Units Subcutaneous QHS   . insulin lispro  1-8 Units Subcutaneous TID AC   . lisinopril  40 mg Oral Daily   . LORazepam  0.5 mg Intravenous Once         Allergies    Patient has no known allergies.      Review of Systems   Pertinent items are noted in HPI.  All other systems were reviewed and are negative except for that mentioned in the HPI    Physical Exam:   Temp:  [98.1 F (36.7 C)] 98.1 F (36.7 C)  Heart Rate:  [94-99] 94  Resp Rate:  [15-18] 15  BP: (129-157)/(77-86) 132/84     Vital Signs:  Reviewed    General: The patient was well developed and well nourished.  No acute distress. Cooperative with the exam  Neck:  No carotid bruits  CVS: RRR  Resp: CTA bilaterally, no retractions  Abd: Soft, nontender  Extremities: no pedal edema, extremities normal in  color    Mental Status: The patient is awake, alert and oriented to person, place, and time.  Affect is normal  Fund of knowledge appropriate  Recent and remote memory are intact   Attention span and concentration appear normal.  Language function is normal. There is no evidence of aphasia in conversational speech. Follows commands    Cranial nerves:   -CN II: Visual fields full to bedside confrontation   -CN III, IV, VI: Pupils equal, round, and reactive to light; extraocular movements intact; + several beats of  nystagmus when looking to the L; no ptosis              -CN V: Facial sensation intact in V1 through V3 distributions   -CN VII: Face symmetric   -CN VIII: Hearing intact to conversational speech   -CN IX, X: Palate elevates symmetrically; normal phonation   -CN XI: Symmetric full strength of sternocleidomastoid and trapezius muscles   -CN XII: Tongue protrudes midline    Motor: Muscle tone normal without spasticity or flaccidity. No atrophy.  Mild LUE pronation   Strength  R / L    R / L  Deltoid  5 / 5  Hip Flexion 5 / 5  Triceps  5 / 5   Hip extension 5 / 5  Biceps  5 / 5   Knee flexion 5 / 5  Wrist ext 5 / 5  Knee ext 5 / 5  Wrist flexion 5 / 5  Dorsiflexion 5 / 5  FF   5 / 5  Plantar flexion 5 / 5    Sensory:   Light touch intact.  Temperature intact.      Reflexes:  R / L     R / L  Biceps  2 / 2  Knees  1 / 1  Triceps 2 / 2  Ankles  1 / 1  BR                   2 / 2  Plantars Deferred/deferred     Coordination: Mild dysmetria on FTN on the L, intact on the R. HTS intact. No tremors    Gait: Deferred 2/2 pt safety concerns       Labs:     Results     Procedure Component Value Units Date/Time    Troponin I [[id number]] Collected:  10/12/17 0728    Specimen:  Blood Updated:  10/12/17 0834     Troponin I <0.01 ng/mL     Narrative:       Replace urinary catheter prior to obtaining the urine culture  if it has been in place for greater than or equal to 14  days:->N/A No Foley  Indications for U/A Reflex to  Micro - Reflex to  Culture:->Suprapubic Pain/Tenderness or Dysuria    Hemolysis index [[id number]]  (Abnormal) Collected:  10/12/17 0728     Updated:  10/12/17 7829     Hemolysis Index 27 (H)    Narrative:       Replace urinary catheter prior to obtaining the urine culture  if it has been in place for greater than or equal to 14  days:->N/A No Foley  Indications for U/A Reflex to Micro - Reflex to  Culture:->Suprapubic Pain/Tenderness or Dysuria    GFR [[id number]] Collected:  10/12/17 7846  Updated:  10/12/17 1610     EGFR >60.0    Narrative:       Replace urinary catheter prior to obtaining the urine culture  if it has been in place for greater than or equal to 14  days:->N/A No Foley  Indications for U/A Reflex to Micro - Reflex to  Culture:->Suprapubic Pain/Tenderness or Dysuria    Comprehensive Metabolic Panel (CMP) [[id number]]  (Abnormal) Collected:  10/12/17 0728    Specimen:  Blood Updated:  10/12/17 0828     Glucose 361 (H) mg/dL      BUN 11.9 mg/dL      Creatinine 1.2 mg/dL      Sodium 147 mEq/L      Potassium 4.8 mEq/L      Chloride 101 mEq/L      CO2 24 mEq/L      Calcium 10.0 mg/dL      Protein, Total 7.8 g/dL      Albumin 3.9 g/dL      AST (SGOT) 15 U/L      ALT 17 U/L      Alkaline Phosphatase 78 U/L      Bilirubin, Total 0.5 mg/dL      Globulin 3.9 (H) g/dL      Albumin/Globulin Ratio 1.0     Anion Gap 13.0    Narrative:       Replace urinary catheter prior to obtaining the urine culture  if it has been in place for greater than or equal to 14  days:->N/A No Foley  Indications for U/A Reflex to Micro - Reflex to  Culture:->Suprapubic Pain/Tenderness or Dysuria    UA Reflex to Micro - Reflex to Culture [[id number]]  (Abnormal) Collected:  10/12/17 8657     Updated:  10/12/17 0816     Urine Type Urine, Clean Ca     Color, UA Straw     Clarity, UA Clear     Specific Gravity UA 1.026     Urine pH 6.0     Leukocyte Esterase, UA Negative     Nitrite, UA Negative     Protein, UR Negative     Glucose, UA 500  (A)     Ketones UA Negative     Urobilinogen, UA Negative mg/dL      Bilirubin, UA Negative     Blood, UA Negative     RBC, UA 0 - 2 /hpf      WBC, UA 0 - 5 /hpf     Narrative:       Replace urinary catheter prior to obtaining the urine culture  if it has been in place for greater than or equal to 14  days:->N/A No Foley  Indications for U/A Reflex to Micro - Reflex to  Culture:->Suprapubic Pain/Tenderness or Dysuria    CBC with differential [[id number]]  (Abnormal) Collected:  10/12/17 0728    Specimen:  Blood from Blood Updated:  10/12/17 0810     WBC 6.71 x10 3/uL      Hgb 14.8 g/dL      Hematocrit 84.1 (H) %      Platelets 271 x10 3/uL      RBC 5.57 x10 6/uL      MCV 89.2 fL      MCH 26.6 pg      MCHC 29.8 (L) g/dL      RDW 13 %      MPV 9.5 fL      Neutrophils 52.5 %      Lymphocytes  Automated 35.9 %      Monocytes 8.3 %      Eosinophils Automated 2.4 %      Basophils Automated 0.6 %      Immature Granulocyte 0.3 %      Nucleated RBC 0.0 /100 WBC      Neutrophils Absolute 3.52 x10 3/uL      Abs Lymph Automated 2.41 x10 3/uL      Abs Mono Automated 0.56 x10 3/uL      Abs Eos Automated 0.16 x10 3/uL      Absolute Baso Automated 0.04 x10 3/uL      Absolute Immature Granulocyte 0.02 x10 3/uL      Absolute NRBC 0.00 x10 3/uL     Narrative:       Replace urinary catheter prior to obtaining the urine culture  if it has been in place for greater than or equal to 14  days:->N/A No Foley  Indications for U/A Reflex to Micro - Reflex to  Culture:->Suprapubic Pain/Tenderness or Dysuria          Rads:     Results for orders placed or performed during the hospital encounter of 10/12/17   CT Head without Contrast    Narrative    CT HEAD WO CONTRAST:10/12/2017 8:16 AM       INDICATION:CEREBRAL ISCHEMIA        TECHNIQUE: Axial images were obtained from the skull base to vertex  without contrast. No prior studies are available for comparison    FINDINGS: The ventricles and sulci are moderately prominent but  consistent with  patient's chronologic age. There is fairly extensive  hypoattenuation through most of the left cerebellar hemisphere  compatible with age-indeterminate ischemic change. Mild periventricular  leukomalacia is noted Decreased density is present in the deep white  matter of each hemisphere consistent with small vessel disease change.  No intracerebral mass or hemorrhage is present, and no extra-axial  collections are present.      Impression     Extensive hypodensity through the left cerebellar  hemisphere most likely age-indeterminate ischemic change.    Note: Note that CT scanning at this site  utilizes multiple dose  reduction techniques including automatic exposure control, adjustment of  the MAA and/or KVP according to patient's size and use of iterative  reconstruction technique     Laurena Slimmer, MD   10/12/2017 8:21 AM       Signed by:  Ladoris Gene, FNP-BC  Nurse Practitioner  [facility]  Spectra: [facility] [phone]  [facility] [phone]    *Final recommendations per attending neurologist who will also see patient today and record notes.

## 2017-10-12 NOTE — H&P (Signed)
[facility]      Patient: [name]  Date: 10/12/2017   DOB: [birth year]  Admission Date: 10/12/2017   MRN: [id number]  Attending: Dr. Talbert Cage NP         Chief Complaint   Patient presents with   . Headache   . Dizziness      History Gathered From: Patient and chart    HISTORY AND PHYSICAL     [name] is a 52 y.o. male with a PMHx of hypertension and diabetes who presents with complaints of intractable headaches associated with dizziness and nausea since Sunday.  Described headache as pressure initially located on the Left side of his head now generalized.  Headache woke him up from sleep this morning and is progressively worsening associated with room spinning sensation.  Headache is not relieved with Aleve and BC powder.  States he went to a ED in [place], head CT was normal.  Denies numbness, tingling, weakness, fever, abdominal pain nausea vomiting diarrhea neck pain trauma and other complaints      In the emergency room, chest x-ray with no acute process, head CT showing age indeterminate ischemic change, and neurology consult, MRI pending      Past Medical History:   Diagnosis Date   . Diabetes mellitus    . Gout    . Hypertension        History reviewed. No pertinent surgical history.    Prior to Admission medications    Medication Sig Start Date End Date Taking? Authorizing Provider   colchicine 0.6 MG tablet 2 tabs by mouth for the 1st dose, then 1 tab 1 hour later. Starting day 2 may take 1 tab one or two times daily as tolerated for gout flare. 10/03/17   Arther Abbott, DO   Empagliflozin-metFORMIN HCl (SYNJARDY) 12.07-998 MG Tab Take by mouth    [provider]   HYDROcodone-acetaminophen (NORCO) 5-325 MG per tablet Take 1 tablet by mouth every 6 (six) hours as needed for Pain 09/17/17   Lars Pinks, NP   insulin glargine (TOUJEO MAX SOLOSTAR) 300 UNIT/ML injection pen Inject into the skin nightly    [provider]   liraglutide (VICTOZA)  18 MG/3ML injection Inject into the skin    [provider]   lisinopril (PRINIVIL,ZESTRIL) 40 MG tablet Take 40 mg by mouth daily    [provider]   meloxicam (MOBIC) 7.5 MG tablet Take 2 tablets (15 mg total) by mouth daily 10/03/17   Arther Abbott, DO       No Known Allergies    CODE STATUS: full code    PRIMARY CARE MD: Pcp, [facility], MD    Family History   Problem Relation Age of Onset   . Diabetes Mother    . Hypertension Mother        Social History   Substance Use Topics   . Smoking status: Never Smoker   . Smokeless tobacco: Never Used   . Alcohol use No       REVIEW OF SYSTEMS   Positive for: See HPI  Negative for: See HPI  All ROS completed and otherwise negative.    PHYSICAL EXAM     Vital Signs (most recent): BP 132/84   Pulse 94   Temp 98.1 F (36.7 C) (Oral)   Resp 15   Ht 1.702 m (5\' 7" )   Wt 97.5 kg (215 lb)   SpO2 99%   BMI 33.67  kg/m   Constitutional: Nontoxic looking, alert oriented x3, no acute distress, flat affect.  HEENT: NC/AT, PERRL, no scleral icterus or conjunctival pallor, no nasal discharge, MMM, oropharynx without erythema or exudate  Neck: trachea midline, supple, no cervical or supraclavicular lymphadenopathy or masses  Cardiovascular: RRR, normal S1 S2, no murmurs, gallops, palpable thrills, no JVD, Non-displaced PMI.  Respiratory: Normal rate. No retractions or increased work of breathing. Clear to auscultation and percussion bilaterally.  Gastrointestinal: +BS, non-distended, soft, non-tender, no rebound or guarding, no hepatosplenomegaly  Genitourinary: no suprapubic or costovertebral angle tenderness  Musculoskeletal: ROM and motor strength grossly normal. No clubbing, edema, or cyanosis. DP and radial pulses 2+ and symmetric.  Skin exam: Normal appearing, no lesions or rash  Neurologic: EOMI, CN 2-12 grossly intact. no gross motor or sensory deficits  Psychiatric: AAOx3, affect and mood appropriate. The patient is alert, interactive,  appropriate.  Capillary refill: Normal    Exam done by Pilar Plate, NP on 10/12/17 at 10:18 AM      LABS & IMAGING     Recent Results (from the past 24 hour(s))   ECG 12 Lead    Collection Time: 10/12/17  7:12 AM   Result Value Ref Range    Ventricular Rate 100 BPM    Atrial Rate 100 BPM    P-R Interval 140 ms    QRS Duration 98 ms    Q-T Interval 334 ms    QTC Calculation (Bezet) 430 ms    P Axis 75 degrees    R Axis 88 degrees    T Axis -70 degrees   CBC with differential    Collection Time: 10/12/17  7:28 AM   Result Value Ref Range    WBC 6.71 3.10 - 9.50 x10 3/uL    Hgb 14.8 12.5 - 17.1 g/dL    Hematocrit 04.5 (H) 37.6 - 49.6 %    Platelets 271 142 - 346 x10 3/uL    RBC 5.57 4.20 - 5.90 x10 6/uL    MCV 89.2 78.0 - 96.0 fL    MCH 26.6 25.1 - 33.5 pg    MCHC 29.8 (L) 31.5 - 35.8 g/dL    RDW 13 11 - 15 %    MPV 9.5 8.9 - 12.5 fL    Neutrophils 52.5 None %    Lymphocytes Automated 35.9 None %    Monocytes 8.3 None %    Eosinophils Automated 2.4 None %    Basophils Automated 0.6 None %    Immature Granulocyte 0.3 None %    Nucleated RBC 0.0 0.0 - 0.0 /100 WBC    Neutrophils Absolute 3.52 1.10 - 6.33 x10 3/uL    Abs Lymph Automated 2.41 0.42 - 3.22 x10 3/uL    Abs Mono Automated 0.56 0.21 - 0.85 x10 3/uL    Abs Eos Automated 0.16 0.00 - 0.44 x10 3/uL    Absolute Baso Automated 0.04 0.00 - 0.08 x10 3/uL    Absolute Immature Granulocyte 0.02 0.00 - 0.07 x10 3/uL    Absolute NRBC 0.00 0.00 - 0.00 x10 3/uL   Comprehensive Metabolic Panel (CMP)    Collection Time: 10/12/17  7:28 AM   Result Value Ref Range    Glucose 361 (H) 70 - 100 mg/dL    BUN 40.9 9.0 - 81.1 mg/dL    Creatinine 1.2 0.7 - 1.3 mg/dL    Sodium 914 782 - 956 mEq/L    Potassium 4.8 3.5 - 5.1 mEq/L    Chloride 101  100 - 111 mEq/L    CO2 24 22 - 29 mEq/L    Calcium 10.0 8.5 - 10.5 mg/dL    Protein, Total 7.8 6.0 - 8.3 g/dL    Albumin 3.9 3.5 - 5.0 g/dL    AST (SGOT) 15 5 - 34 U/L    ALT 17 0 - 55 U/L    Alkaline Phosphatase 78 38 - 106 U/L    Bilirubin,  Total 0.5 0.2 - 1.2 mg/dL    Globulin 3.9 (H) 2.0 - 3.6 g/dL    Albumin/Globulin Ratio 1.0 0.9 - 2.2    Anion Gap 13.0 5.0 - 15.0   Troponin I    Collection Time: 10/12/17  7:28 AM   Result Value Ref Range    Troponin I <0.01 0.00 - 0.09 ng/mL   UA Reflex to Micro - Reflex to Culture    Collection Time: 10/12/17  7:28 AM   Result Value Ref Range    Urine Type Urine, Clean Ca     Color, UA Straw Clear - Yellow    Clarity, UA Clear Clear - Hazy    Specific Gravity UA 1.026 1.001 - 1.035    Urine pH 6.0 5.0 - 8.0    Leukocyte Esterase, UA Negative Negative    Nitrite, UA Negative Negative    Protein, UR Negative Negative    Glucose, UA 500 (A) Negative    Ketones UA Negative Negative    Urobilinogen, UA Negative 0.2 - 2.0 mg/dL    Bilirubin, UA Negative Negative    Blood, UA Negative Negative    RBC, UA 0 - 2 0 - 5 /hpf    WBC, UA 0 - 5 0 - 5 /hpf   Hemolysis index    Collection Time: 10/12/17  7:28 AM   Result Value Ref Range    Hemolysis Index 27 (H) 0 - 18   GFR    Collection Time: 10/12/17  7:28 AM   Result Value Ref Range    EGFR >60.0        MICROBIOLOGY:  Blood Culture NA  Urine Culture:    Antibiotics Started:  NA    IMAGING:  Upon my review:    Ct Head Without Contrast    Result Date: 10/12/2017   Extensive hypodensity through the left cerebellar hemisphere most likely age-indeterminate ischemic change. Note: Note that CT scanning at this site  utilizes multiple dose reduction techniques including automatic exposure control, adjustment of the MAA and/or KVP according to patient's size and use of iterative reconstruction technique Laurena Slimmer, MD 10/12/2017 8:21 AM    Chest Ap Portable    Result Date: 10/12/2017   No definite acute evaluate accounting for degree of an sprain Laurena Slimmer, MD 10/12/2017 8:12 AM      CARDIAC:  EKG Interpretation (upon my review):   ST with T wave inversions V6, II, III, and AVF     Markers:    Recent Labs  Lab 10/12/17  0728   Troponin I <0.01       EMERGENCY DEPARTMENT  COURSE:  Orders Placed This Encounter   Procedures   . Chest AP Portable   . CT Head without Contrast   . CBC with differential   . Comprehensive Metabolic Panel (CMP)   . Troponin I   . UA Reflex to Micro - Reflex to Culture   . Hemolysis index   . GFR   . Orthostatic Vital Signs   . ECG 12 Lead   .  ECG 12 Lead   . EKG SCAN   . Admit to Inpatient   . [facility] ED Bed Request (Inpatient)       ASSESSMENT & PLAN     [name] is a 52 y.o. male admitted under [facility] with Acute nonintractable headache, unspecified headache type.    Patient Active Hospital Problem List:  Acute nonintractable headache, unspecified headache type, CVA (10/12/2017)  Assessment:  Acute   Plan:  Neurology is on board  HCT reviewed and noted  PT/OT/SP  MRI/MRA pending, continue Neuro checks  Continue ASA, allow permissive hypertension in view of possible CVA  Check Lipid pane, A1C  Further recs per Neurology    Gait instability with dizziness  Assessment: r/o stroke  Plan: Neuro is on board  PT/OT  Continue with same plan as above    History of hypertension (10/12/2017)  Assessment:  stable  Plan:  Resume home med with holding parameters  Allow permissive HTN     Uncontrolled type 2 diabetes mellitus with hyperglycemia, with long-term current use of insulin (10/12/2017)  Assessment:  Likely uncontrolled  Plan:  Correctional insulin  Accu checks, A1C  Diabetic education      Nutrition  Consistent Carb    DVT/VTE Prophylaxis   Lovenox    Anticipated medical stability for discharge:      Service status/Reason for ongoing hospitalization:   Anticipated Discharge Needs:      Barbaraann Rondo NP    10/12/2017 10:18 AM  Time Elapsed:

## 2017-10-12 NOTE — EDIE (Signed)
COLLECTIVE?NOTIFICATION?10/12/2017 07:09?RICKI, CLACK B?MRN: [id number]    Criteria Met      5 ED Visits in 12 Months    3 Different Facilities in 90 Days    Security and Safety  No recent Security Events currently on file    ED Care Guidelines  There are currently no ED Care Guidelines for this patient. Please check your facility's medical records system.      Prescription Monitoring Program  130??- Narcotic Use Score  080??- Sedative Use Score  000??- Stimulant Use Score  270??- Overdose Risk Score  - All Scores range from 000-999 with 75% of the population scoring < 200 and on 1% scoring above 650  - The last digit of the narcotic, sedative, and stimulant score indicates the number of active prescriptions of that type  - Higher Use scores correlate with increased prescribers, pharmacies, mg equiv, and overlapping prescriptions  - Higher Overdose Risk Scores correlate with increased risk of unintentional overdose death   Concerning or unexpectedly high scores should prompt a review of the PMP record; this does not constitute checking PMP for prescribing purposes.      E.D. Visit Count (12 mo.)  Facility Visits   [place] [facility] 5   [facility] [facility] 1   [facility] 3   Total 9   Note: Visits indicate total known visits.      Recent Emergency Department Visit Summary  Date Facility [facility] Type Diagnoses or Chief Complaint   Oct 12, 2017 [place] [facility] [place] [facility]      Dizziness, Lightheaded      Oct 03, 2017 [place] [facility] [place] [facility]      Knee Pain      Gout      Gout, unspecified      Sep 17, 2017 [place] [facility] [place] [facility]      Back of Right Foot Pain      Foot Pain      Idiopathic gout, right ankle and foot      Sep 12, 2017 [place] [facility] [place] [facility] [place] [facility]      Goat      Gout      Other secondary gout, right knee      Gout, unspecified      Aug 31, 2017 [place] [facility] [place] [facility]      Gout      Gout, unspecified      Aug 17, 2017 [place] [facility] [place] [facility]      left foot pain      Gout, unspecified      Jul 26, 2017 [place] [facility] [place] [facility]      RT Shoulder Pain      Shoulder Pain      Pain in right shoulder      Jul 19, 2017 [place] [facility] [place] [facility]      RT Shoulder Injury      Shoulder Pain      Pain in right shoulder      Jul 05, 2017 [place] [facility] [place] [facility]      R Shoulder Pain      Rash      Shoulder Pain      Pain in right shoulder      Type  2 diabetes mellitus with hyperglycemia      Long term (current) use of insulin          Recent Inpatient Visit Summary  No recorded inpatient visits.     Care Team  There are no care providers on record at this time.   Collective Portal  This patient has registered at the [facility] Emergency Department   For more information visit: [email or web address]     PLEASE NOTE:    1.   Any care recommendations and other clinical information are provided as guidelines or for historical purposes only, and providers should exercise their own clinical judgment when providing care.    2.   You may only use this information for purposes of treatment, payment or health care operations activities, and subject to the limitations of applicable Collective Policies.    3.   You should consult directly with the organization that provided a care guideline or other clinical history with any questions about additional information or accuracy or completeness of information provided.    ? 2019 [place] - [email or web address]

## 2017-10-12 NOTE — ED Triage Notes (Addendum)
.  [name] is a 52 y.o. male c/o HA & dizziness x 2-3 day.  Pt stated he's been taking aleve & BC powder for HA.  Pt denies N/V/D/GU/GI symptoms.   Pt stated when he gets up to walk & room is spinning.

## 2017-10-12 NOTE — ED Provider Notes (Signed)
EMERGENCY DEPARTMENT HISTORY AND PHYSICAL EXAM     Physician/Midlevel provider first contact with patient: 10/12/17 5409         Date: 10/12/2017  Patient Name: [name]    History of Presenting Illness     Chief Complaint   Patient presents with   . Headache   . Dizziness       History Provided By: Pt    Chief Complaint: HA  Duration: 2-3 days  Timing: Constant  Location: Back of head  Quality: Pressure  Severity: Moderate  Exacerbating Factors: None   Alleviating Factors: Aleve  Associated Symptoms: Lightheaded and dizzy, unsteady gait  Pertinent Negatives: Numbness, weakness, fevers, rhinorrhea, neck pain, sore throat, chest pain    Additional History: [name] is a 52 y.o. male with a hx of HTN and DM presenting to the ED with a HA to the back of the head for the past 2-3 days. The pain woke pt up from sleep. Pt has been taking Aleve with temporary improvement. He also notes feeling lightheaded with the HA along with some room spinning dizziness. Denies numbness, weakness, fevers, rhinorrhea, neck pain, sore throat, or chest pain.    PCP: Pcp, [facility], MD  Specialist: Unknown       No current facility-administered medications for this encounter.      Current Outpatient Prescriptions   Medication Sig Dispense Refill   . colchicine 0.6 MG tablet 2 tabs by mouth for the 1st dose, then 1 tab 1 hour later. Starting day 2 may take 1 tab one or two times daily as tolerated for gout flare. 10 tablet 0   . Empagliflozin-metFORMIN HCl (SYNJARDY) 12.07-998 MG Tab Take by mouth     . HYDROcodone-acetaminophen (NORCO) 5-325 MG per tablet Take 1 tablet by mouth every 6 (six) hours as needed for Pain 10 tablet 0   . insulin glargine (TOUJEO MAX SOLOSTAR) 300 UNIT/ML injection pen Inject into the skin nightly     . liraglutide (VICTOZA) 18 MG/3ML injection Inject into the skin     . lisinopril (PRINIVIL,ZESTRIL) 40 MG tablet Take 40 mg by mouth daily     . meloxicam (MOBIC) 7.5 MG tablet Take 2 tablets  (15 mg total) by mouth daily 30 tablet 0       Past History     Past Medical History:  Past Medical History:   Diagnosis Date   . Diabetes mellitus    . Gout    . Hypertension        Past Surgical History:  History reviewed. No pertinent surgical history.    Family History:  Family History   Problem Relation Age of Onset   . Diabetes Mother    . Hypertension Mother        Social History:  Social History   Substance Use Topics   . Smoking status: Never Smoker   . Smokeless tobacco: Never Used   . Alcohol use No       Allergies:  No Known Allergies    Review of Systems     Review of Systems   Constitutional: Positive for fatigue. Negative for fever.   HENT: Negative for rhinorrhea and sore throat.    Eyes: Negative for discharge, redness and visual disturbance.   Respiratory: Negative for cough and shortness of breath.    Cardiovascular: Negative for chest pain and leg swelling.   Gastrointestinal: Negative for abdominal pain and nausea.   Endocrine: Negative for polyuria.   Genitourinary:  Negative for dysuria, flank pain, frequency and urgency.   Musculoskeletal: Negative for back pain, neck pain and neck stiffness.   Skin: Negative for rash.   Allergic/Immunologic: Negative for immunocompromised state.   Neurological: Positive for dizziness, light-headedness and headaches. Negative for weakness and numbness.   Hematological: Does not bruise/bleed easily.   Psychiatric/Behavioral: Negative for suicidal ideas.       Physical Exam   BP 132/84   Pulse 94   Temp 98.1 F (36.7 C) (Oral)   Resp 15   Ht 5\' 7"  (1.702 m)   Wt 97.5 kg   SpO2 96%   BMI 33.67 kg/m     Constitutional: Vital signs reviewed. Well appearing. No distress.  Head: Normocephalic, atraumatic  Eyes: Conjunctiva and sclera are normal.  No injection or discharge.  Ears, Nose, Throat:  Normal external examination of the nose and ears. No throat or oropharyngeal swelling or erythema. Midline uvula. Mucous membranes moist.  Neck: Normal range of  motion. Supple, no meningeal signs. Trachea midline. No stridor. No JVD  Respiratory/Chest: Clear to auscultation. No respiratory distress.   Cardiovascular: Regular rate and rhythm. No murmurs.  Abdomen:  Bowel sounds intact. No rebound or guarding. Soft.  Non-tender.  Back: No CVA tenderness to percussion. No focal tenderness.  Upper Extremity:  No edema. No cyanosis. Bilateral radial pulses intact and equal.   Lower Extremity:  No edema. No cyanosis. Bilateral calves symmetrical and non-tender. Bilateral femoral, DP, PT pulses intact and equal.  Skin: Warm and dry. No rash.  Neuro: A&Ox3. CNII -XII intact to testing. No nystagmus. No skew deviation. Strength 5/5 and symmetrical in the bilateral upper and lower extremities. Sensation to sharp touch intact and equal in the bilateral upper and lower extremities. Coordination intact to finger to nose testing, heel to shin rub, rapidly alternating hand movements. NIHSS zero.  Psychiatric: Normal affect.  Normal insight.        Diagnostic Study Results     Labs -     Results     Procedure Component Value Units Date/Time    Troponin I [[id number]] Collected:  10/12/17 0728    Specimen:  Blood Updated:  10/12/17 0834     Troponin I <0.01 ng/mL     Narrative:       Replace urinary catheter prior to obtaining the urine culture  if it has been in place for greater than or equal to 14  days:->N/A No Foley  Indications for U/A Reflex to Micro - Reflex to  Culture:->Suprapubic Pain/Tenderness or Dysuria    Hemolysis index [[id number]]  (Abnormal) Collected:  10/12/17 0728     Updated:  10/12/17 7829     Hemolysis Index 27 (H)    Narrative:       Replace urinary catheter prior to obtaining the urine culture  if it has been in place for greater than or equal to 14  days:->N/A No Foley  Indications for U/A Reflex to Micro - Reflex to  Culture:->Suprapubic Pain/Tenderness or Dysuria    GFR [[id number]] Collected:  10/12/17 0728     Updated:  10/12/17 7846     EGFR >60.0    Narrative:        Replace urinary catheter prior to obtaining the urine culture  if it has been in place for greater than or equal to 14  days:->N/A No Foley  Indications for U/A Reflex to Micro - Reflex to  Culture:->Suprapubic Pain/Tenderness or Dysuria    Comprehensive Metabolic Panel (CMP) [[id number]]  (  Abnormal) Collected:  10/12/17 0728    Specimen:  Blood Updated:  10/12/17 0828     Glucose 361 (H) mg/dL      BUN 82.9 mg/dL      Creatinine 1.2 mg/dL      Sodium 562 mEq/L      Potassium 4.8 mEq/L      Chloride 101 mEq/L      CO2 24 mEq/L      Calcium 10.0 mg/dL      Protein, Total 7.8 g/dL      Albumin 3.9 g/dL      AST (SGOT) 15 U/L      ALT 17 U/L      Alkaline Phosphatase 78 U/L      Bilirubin, Total 0.5 mg/dL      Globulin 3.9 (H) g/dL      Albumin/Globulin Ratio 1.0     Anion Gap 13.0    Narrative:       Replace urinary catheter prior to obtaining the urine culture  if it has been in place for greater than or equal to 14  days:->N/A No Foley  Indications for U/A Reflex to Micro - Reflex to  Culture:->Suprapubic Pain/Tenderness or Dysuria    UA Reflex to Micro - Reflex to Culture [[id number]]  (Abnormal) Collected:  10/12/17 0728     Updated:  10/12/17 0816     Urine Type Urine, Clean Ca     Color, UA Straw     Clarity, UA Clear     Specific Gravity UA 1.026     Urine pH 6.0     Leukocyte Esterase, UA Negative     Nitrite, UA Negative     Protein, UR Negative     Glucose, UA 500 (A)     Ketones UA Negative     Urobilinogen, UA Negative mg/dL      Bilirubin, UA Negative     Blood, UA Negative     RBC, UA 0 - 2 /hpf      WBC, UA 0 - 5 /hpf     Narrative:       Replace urinary catheter prior to obtaining the urine culture  if it has been in place for greater than or equal to 14  days:->N/A No Foley  Indications for U/A Reflex to Micro - Reflex to  Culture:->Suprapubic Pain/Tenderness or Dysuria    CBC with differential [[id number]]  (Abnormal) Collected:  10/12/17 0728    Specimen:  Blood from Blood Updated:  10/12/17 0810      WBC 6.71 x10 3/uL      Hgb 14.8 g/dL      Hematocrit 13.2 (H) %      Platelets 271 x10 3/uL      RBC 5.57 x10 6/uL      MCV 89.2 fL      MCH 26.6 pg      MCHC 29.8 (L) g/dL      RDW 13 %      MPV 9.5 fL      Neutrophils 52.5 %      Lymphocytes Automated 35.9 %      Monocytes 8.3 %      Eosinophils Automated 2.4 %      Basophils Automated 0.6 %      Immature Granulocyte 0.3 %      Nucleated RBC 0.0 /100 WBC      Neutrophils Absolute 3.52 x10 3/uL      Abs Lymph Automated 2.41 x10 3/uL  Abs Mono Automated 0.56 x10 3/uL      Abs Eos Automated 0.16 x10 3/uL      Absolute Baso Automated 0.04 x10 3/uL      Absolute Immature Granulocyte 0.02 x10 3/uL      Absolute NRBC 0.00 x10 3/uL     Narrative:       Replace urinary catheter prior to obtaining the urine culture  if it has been in place for greater than or equal to 14  days:->N/A No Foley  Indications for U/A Reflex to Micro - Reflex to  Culture:->Suprapubic Pain/Tenderness or Dysuria          Radiologic Studies -   Radiology Results (24 Hour)     Procedure Component Value Units Date/Time    CT Head without Contrast [[id number]] Collected:  10/12/17 0820    Order Status:  Completed Updated:  10/12/17 0825    Narrative:       CT HEAD WO CONTRAST:10/12/2017 8:16 AM       INDICATION:CEREBRAL ISCHEMIA        TECHNIQUE: Axial images were obtained from the skull base to vertex  without contrast. No prior studies are available for comparison    FINDINGS: The ventricles and sulci are moderately prominent but  consistent with patient's chronologic age. There is fairly extensive  hypoattenuation through most of the left cerebellar hemisphere  compatible with age-indeterminate ischemic change. Mild periventricular  leukomalacia is noted Decreased density is present in the deep white  matter of each hemisphere consistent with small vessel disease change.  No intracerebral mass or hemorrhage is present, and no extra-axial  collections are present.      Impression:         Extensive hypodensity through the left cerebellar  hemisphere most likely age-indeterminate ischemic change.    Note: Note that CT scanning at this site  utilizes multiple dose  reduction techniques including automatic exposure control, adjustment of  the MAA and/or KVP according to patient's size and use of iterative  reconstruction technique     Laurena Slimmer, MD   10/12/2017 8:21 AM    Chest AP Portable [[id number]] Collected:  10/12/17 0811    Order Status:  Completed Updated:  10/12/17 0816    Narrative:       History: Weakness    Technique: Single Portable View    Comparison: None.    Findings:  The lungs appear clear, except for some vascular crowding bilaterally,  related to a suboptimal inspiration.   There is no pneumothorax.  There is moderate cardiomegaly.     The mediastinum is within normal  limits.             Impression:        No definite acute evaluate accounting for degree  of an sprain    Laurena Slimmer, MD   10/12/2017 8:12 AM      .      Medical Decision Making   I am the first provider for this patient.    I reviewed the vital signs, available nursing notes, past medical history, past surgical history, family history and social history.    Vital Signs-Reviewed the patient's vital signs.     Patient Vitals for the past 12 hrs:   BP Temp Pulse Resp   10/12/17 0917 132/84 98.1 F (36.7 C) 94 15   10/12/17 0743 129/77 - 97 -   10/12/17 0710 157/86 98.1 F (36.7 C) 99 18  Pulse Oximetry Analysis - Normal, 99% on RA    Old Medical Records: Nursing notes.     Cardiac Monitor:   Rate: 98   Rhythm: Normal Sinus Rhythm   EKG:  Interpreted by the Emergency Physician.   Time Interpreted: 715   Rate: 100   Rhythm: Sinus Tachycardia    Interpretation: No ST elevation. T wave inversions V6, II, III, and AVF   Comparison:  No prior     Clinical Decision Support: NIHSS zero           ED Course:     7:25 AM - Pt agreeable to ED plan, including having lab work and imaging performed.     9:29 AM - Spoke to  [place], [place] for on-call Neurology. IMG Neuro will consult and says to give ASA and order MRI/MRA.    9:35 AM - Updated pt on results and plan for hospitalization. Pt is agreeable with plan.     9:48 AM - Spoke to [place], NP for [facility]. Accepts [facility] to [facility], inpatient, on behalf of Dr. Saddie Benders.     Provider Notes: Concern for stroke. Not tPA candidate.    For Hospitalized Patients:    1. Hospitalization Decision Time:  The decision to admit this patient was made by the emergency provider at 9:54 AM on 10/12/2017     2. Aspirin: Aspirin was given    3. Core Measures:     This patient was last known in usual state of health at:    Date: 10/10/2017 or 10/09/2017    Time: Unknown    Glucose fingerstick: 361    The eligibility of IV Alteplase was assessed and discussed. IV Alteplase was not given because of duration of sxs.         Diagnosis     Clinical Impression:   1. Acute nonintractable headache, unspecified headache type    2. Gait difficulty    3. Cerebrovascular accident (CVA), unspecified mechanism        Treatment Plan:   ED Disposition     ED Disposition Condition Date/Time Comment    Admit  Wed Oct 12, 2017  9:54 AM Admitting Physician: Druscilla Brownie [facility] [[id number]]   Diagnosis: Acute nonintractable headache, unspecified headache type [6045409]   Estimated Length of Stay: > or = to 2 midnights   Tentative Discharge Plan?: Home or Self Care [1]   Patient Class: Inpatient [101]   Bed request comments: Unit 25          _______________________________    Attestations:   This note is prepared by Marcille Blanco, acting as Scribe for Lynnea Ferrier, MD.    Lynnea Ferrier, MD:  The scribe's documentation has been prepared under my direction and personally reviewed by me in its entirety.  I confirm that the note above accurately reflects all work, treatment, procedures, and medical decision making performed by me.    _______________________________             Maryella Shivers, MD  10/12/17 1550

## 2017-10-13 DIAGNOSIS — Z8679 Personal history of other diseases of the circulatory system: Secondary | ICD-10-CM

## 2017-10-13 DIAGNOSIS — I639 Cerebral infarction, unspecified: Principal | ICD-10-CM

## 2017-10-13 DIAGNOSIS — E1165 Type 2 diabetes mellitus with hyperglycemia: Secondary | ICD-10-CM

## 2017-10-13 DIAGNOSIS — I6502 Occlusion and stenosis of left vertebral artery: Secondary | ICD-10-CM

## 2017-10-13 DIAGNOSIS — I651 Occlusion and stenosis of basilar artery: Secondary | ICD-10-CM

## 2017-10-13 DIAGNOSIS — Z794 Long term (current) use of insulin: Secondary | ICD-10-CM

## 2017-10-13 DIAGNOSIS — R233 Spontaneous ecchymoses: Secondary | ICD-10-CM

## 2017-10-13 LAB — BASIC METABOLIC PANEL
Anion Gap: 10 (ref 5.0–15.0)
Anion Gap: 10 (ref 5.0–15.0)
BUN: 21 mg/dL (ref 9.0–28.0)
BUN: 19 mg/dL (ref 9.0–28.0)
CO2: 26 mEq/L (ref 22–29)
CO2: 24 mEq/L (ref 22–29)
Calcium: 9.7 mg/dL (ref 8.5–10.5)
Calcium: 9.4 mg/dL (ref 8.5–10.5)
Chloride: 101 mEq/L (ref 100–111)
Chloride: 104 mEq/L (ref 100–111)
Creatinine: 1 mg/dL (ref 0.7–1.3)
Creatinine: 0.9 mg/dL (ref 0.7–1.3)
Glucose: 240 mg/dL — ABNORMAL HIGH (ref 70–100)
Glucose: 253 mg/dL — ABNORMAL HIGH (ref 70–100)
Potassium: 4.4 mEq/L (ref 3.5–5.1)
Potassium: 4.6 mEq/L (ref 3.5–5.1)
Sodium: 137 mEq/L (ref 136–145)
Sodium: 138 mEq/L (ref 136–145)

## 2017-10-13 LAB — CBC
Absolute NRBC: 0 10*3/uL (ref 0.00–0.00)
Hematocrit: 48.4 % (ref 37.6–49.6)
Hgb: 14.4 g/dL (ref 12.5–17.1)
MCH: 26.3 pg (ref 25.1–33.5)
MCHC: 29.8 g/dL — ABNORMAL LOW (ref 31.5–35.8)
MCV: 88.3 fL (ref 78.0–96.0)
MPV: 9.7 fL (ref 8.9–12.5)
Nucleated RBC: 0 /100 WBC (ref 0.0–0.0)
Platelets: 278 10*3/uL (ref 142–346)
RBC: 5.48 10*6/uL (ref 4.20–5.90)
RDW: 13 % (ref 11–15)
WBC: 11.64 10*3/uL — ABNORMAL HIGH (ref 3.10–9.50)

## 2017-10-13 LAB — HEMOGLOBIN A1C
Average Estimated Glucose: 200.1 mg/dL
Hemoglobin A1C: 8.6 % — ABNORMAL HIGH (ref 4.6–5.9)

## 2017-10-13 LAB — GFR
EGFR: >60
EGFR: >60

## 2017-10-13 LAB — LIPID PANEL
Cholesterol / HDL Ratio: 5.1
Cholesterol: 262 mg/dL — ABNORMAL HIGH (ref 0–199)
HDL: 51 mg/dL (ref 40–9999)
LDL Calculated: 179 mg/dL — ABNORMAL HIGH (ref 0–99)
Triglycerides: 160 mg/dL — ABNORMAL HIGH (ref 34–149)
VLDL Calculated: 32 mg/dL (ref 10–40)

## 2017-10-13 LAB — HEMOLYSIS INDEX
Hemolysis Index: 26 — ABNORMAL HIGH (ref 0–18)
Hemolysis Index: 21 — ABNORMAL HIGH (ref 0–18)
Hemolysis Index: 16 (ref 0–18)

## 2017-10-13 LAB — FIBRINOGEN: Fibrinogen: 479 mg/dL — ABNORMAL HIGH (ref 189–458)

## 2017-10-13 LAB — GLUCOSE WHOLE BLOOD - POCT
Whole Blood Glucose POCT: 205 mg/dL — ABNORMAL HIGH (ref 70–100)
Whole Blood Glucose POCT: 260 mg/dL — ABNORMAL HIGH (ref 70–100)
Whole Blood Glucose POCT: 229 mg/dL — ABNORMAL HIGH (ref 70–100)
Whole Blood Glucose POCT: 205 mg/dL — ABNORMAL HIGH (ref 70–100)

## 2017-10-13 LAB — HOMOCYSTEINE, SERUM: HOMOCYSTEINE: 7.26 umol/L (ref 5.08–15.39)

## 2017-10-13 MED ORDER — SODIUM CHLORIDE 0.9 % IJ SOLN
10.0000 mL | Freq: Every day | INTRAMUSCULAR | Status: DC
Start: 2017-10-13 — End: 2017-10-15
  Administered 2017-10-13 – 2017-10-15 (×3): 10 mL

## 2017-10-13 MED ORDER — PROMETHAZINE HCL 25 MG/ML IJ SOLN
12.50 mg | Freq: Four times a day (QID) | INTRAMUSCULAR | Status: DC | PRN
Start: 2017-10-13 — End: 2017-10-18
  Administered 2017-10-13 – 2017-10-15 (×6): 12.5 mg via INTRAVENOUS
  Filled 2017-10-13 (×6): qty 1

## 2017-10-13 MED ORDER — SODIUM CHLORIDE 3 % IV SOLN
INTRAVENOUS | Status: DC
Start: 2017-10-13 — End: 2017-10-15
  Administered 2017-10-13 – 2017-10-15 (×4): 50 mL/h via INTRAVENOUS
  Filled 2017-10-13 (×5): qty 500

## 2017-10-13 MED ORDER — ACETAMINOPHEN 325 MG PO TABS
650.0000 mg | ORAL_TABLET | ORAL | Status: DC | PRN
Start: 2017-10-13 — End: 2017-10-18
  Administered 2017-10-13 – 2017-10-16 (×5): 650 mg via ORAL
  Filled 2017-10-13 (×5): qty 2

## 2017-10-13 MED ORDER — ATORVASTATIN CALCIUM 40 MG PO TABS
40.00 mg | ORAL_TABLET | Freq: Every evening | ORAL | Status: DC
Start: 2017-10-13 — End: 2017-10-18
  Administered 2017-10-13 – 2017-10-17 (×5): 40 mg via ORAL
  Filled 2017-10-13 (×6): qty 1

## 2017-10-13 NOTE — Plan of Care (Signed)
Problem: Safety  Goal: Patient will be free from injury during hospitalization  Outcome: Progressing   10/13/17 0557   Goal/Interventions addressed this shift   Patient will be free from injury during hospitalization  Assess patient's risk for falls and implement fall prevention plan of care per policy;Provide and maintain safe environment;Use appropriate transfer methods;Ensure appropriate safety devices are available at the bedside;Include patient/ family/ care giver in decisions related to safety;Hourly rounding;Assess for patients risk for elopement and implement Elopement Risk Plan per policy     The learning abilities of the patient and/or caregiver have been assessed. Today's individualized plan of care includes monitoring vitals, telemetry, pain, neuro checks q 4, IV fluids, accu-checks, and implementing safety precautions. Pt is A/O x 4, no c/o pain. No c/o dizziness or lightheadedness. Neuro assessment has been WNL. Pt did c/o restlessness. Notified Dr. Darien Ramus. 1 time order for Ambien was given. Earlier, Ms. Deedee from the MRI reading room called to have me page the neurologist on board to call the reading room and speak to the radiologist regarding the MRI/ MRA report. Dr. Hyacinth Meeker was paged using perfect serve and Deedee informed me that MRI/ MRA results were reported to the neurologist. Pt is currently resting comfortably. No distress noted. Plan of care discussed with the patient. Safety precautions set in place. Will continue to monitor. The patient and/or caregiver agree to the plan of care and demonstrate understanding of the disease process, treatment plan, medications, and consequences of noncompliance. All questions and concerns were addressed.      Problem: Neurological Deficit  Goal: Neurological status is stable or improving  Outcome: Progressing   10/13/17 0557   Goal/Interventions addressed this shift   Neurological status is stable or improving Monitor/assess/document neurological assessment  (Stroke: every 4 hours);Re-assess NIH Stroke Scale for any change in status;Monitor/assess NIH Stroke Scale;Perform CAM Assessment       Problem: Every Day - Stroke  Goal: Neurological status is stable or improving  Outcome: Progressing   10/13/17 0557   Goal/Interventions addressed this shift   Neurological status is stable or improving Monitor/assess/document neurological assessment (Stroke: every 4 hours);Re-assess NIH Stroke Scale for any change in status;Monitor/assess NIH Stroke Scale;Perform CAM Assessment

## 2017-10-13 NOTE — OT Eval Note (Signed)
[facility]  Occupational Therapy Evaluation    Patient: [name]  MRN#: [id number]  Unit: [facility]  Bed: V4098/J1914-N    Time of Evaluation:  Time Calculation  OT Received On: 10/13/17  Start Time: 1203  Stop Time: 1226  Time Calculation (min): 23 min    Chart Review and Collaboration with Care Team: 5 minutes, not included in above time    OT Visit Number: 1    Consult received for [name] for OT Evaluation and Treatment.  Patient's medical condition is appropriate for Occupational therapy intervention at this time.    Activity Orders:  OT eval and treat    Precautions and Contraindications:   Precautions  Weight Bearing Status: no restrictions  Other Precautions: fall    Medical Diagnosis: Gait difficulty [R26.9]  Acute nonintractable headache, unspecified headache type [R51]  Cerebrovascular accident (CVA), unspecified mechanism [I63.9]    History of Present Illness:  [name] is a 52 y.o. male admitted on 10/12/2017 with headache and dizziness. MRI positive for infarction.     Patient Active Problem List   Diagnosis   . Acute nonintractable headache, unspecified headache type   . History of hypertension   . Uncontrolled type 2 diabetes mellitus with hyperglycemia, with long-term current use of insulin   . CVA (cerebral vascular accident)        Past Medical/Surgical History:  Past Medical History:   Diagnosis Date   . Diabetes mellitus    . Gout    . Hypertension       History reviewed. No pertinent surgical history.      X-Rays/Tests/Labs:  Lab Results   Component Value Date/Time    HGB 14.4 10/13/2017 03:54 AM    HCT 48.4 10/13/2017 03:54 AM    K 4.8 10/12/2017 07:28 AM    NA 138 10/12/2017 07:28 AM    TROPI <0.01 10/12/2017 07:28 AM       All imaging reviewed, please see chart for details.    Social History:  Prior Level of Function  Prior level of function: Independent with ADLs, Ambulates independently  Baseline Activity Level: Household  ambulation, Community ambulation  Driving: independent  Dressing - Upper Body: independent  Dressing - Lower Body: independent  Cooking: Yes  Feeding: independent  Bathing: independent  Grooming: independent  Toileting: independent  Employment: FT  DME Currently at Home: Single point cane    Home Living Arrangements  Living Arrangements: Spouse/significant other, Children  Type of Home: House  Home Layout: Multi-level (3 STE)  Bathroom Shower/Tub: Walk-in shower (w/ grab bars)  Bathroom Toilet: Raised  Bathroom Equipment: Grab bars in Medical laboratory scientific officer Accessibility: Accessible via walker  DME Currently at Home: Single point cane    Subjective: Subjective: I feel nauseous Patient is agreeable to participation in the therapy session.   Patient Goal: Get out of the hospital  Pain Assessment  Pain Assessment: No/denies pain      Objective:        Observation of Patient/Vital Signs: VSS    Patient received in bed with telemetry  and intravenous (IV) line in place.    Cognitive Status and Neuro Exam:  Cognition/Neuro Status  Arousal/Alertness: Appropriate responses to stimuli  Attention Span: Appears intact  Orientation Level: Oriented X4  Memory: Appears intact  Following Commands: independent  Safety Awareness: minimal verbal instruction  Insights: Fully aware of deficits  Problem Solving: supervision  Behavior: calm, cooperative  Motor Planning: intact  Coordination: intact  Hand Dominance: right handed  Neuro Status  Behavior: calm, cooperative  Motor Planning: intact  Coordination: intact  Hand Dominance: right handed       Musculoskeletal Examination  Gross ROM  Right Upper Extremity ROM: within functional limits  Left Upper Extremity ROM: within functional limits  Gross Strength  Right Upper Extremity Strength: 5/5  Left Upper Extremity Strength: 5/5       Sensory/Oculomotor Examination  Sensory  Auditory: intact  Tactile - Light Touch: intact  Visual Acuity: intact  Visual Perceptual Skills: intact    Vision -  Complex Assessment  Ocular Range of Motion: Within Functional Limits  Head Position: Within Defined Limits  Tracking: Able to track stimulus in all quads without difficulty    Activities of Daily Living  Self-care and Home Management  Eating: Independent  Grooming: Independent  Bathing: Independent  UB Dressing: Independent  LB Dressing: Independent  Toileting: Independent  Functional Transfers: Supervision  Item Retrieval: Supervision    Functional Mobility:  Mobility and Transfers  Rolling: Independent  Scooting to HOB: Independent  Scooting to EOB: Independent  Supine to Sit: Independent  Sit to Supine: Independent  Sit to Stand: Independent  Bed to Chair: Independent  Bed to [facility]: Independent  Functional Mobility/Ambulation: Supervision     Consulting civil engineer Sitting Balance: Independent  Dyanamic Sitting Balance: Independent  Static Standing Balance: Modified Independent  Dynamic Standing Balance: Supervision    Participation and Activity Tolerance  Participation and Endurance  Participation Effort: good  Endurance: Tolerates 30 min exercise with multiple rests    Educated the patient to role of occupational therapy, plan of care, goals of therapy and safety with mobility and ADLs, discharge instructions, home safety.    Patient left in bed with call bell and all personal items/needs within reach. RN notified of session outcome.       Assessment:  [name] is a 52 y.o. male admitted 10/12/2017. Patient is at baseline and has no inpatient skilled OT needs at this time.     A Brief chart review was completed including review of vitals.  There are no comorbidities or other factors that affect plan of care and require modification of task. Pt does not demonstrates performance deficits.   Pt's ability to complete ADLs and functional transfers is not impaired at this time.    Complexity Chart  Review Performance  Deficits Clinical Decision  Making Hx/Co-  morbidities Assistance needed   Low Brief 1-3  Limited options None None (or at baseline)     Treatment:    - Pt ambulated to bathroom to complete grooming w/ SBA  - Pt vomited after brushing teeth, nursing notified  - Pt demo'd UE exercises to maintain functional strength for ADL and prevent further debility  - Discussed d/c recommendation and equipment recommendation w/ pt. Pt agreeable.     Rehabilitation Potential:  Prognosis: Good      Plan:  D/C OT at this time.    PMP Activity: Step 6 - Walks in Room  Distance Walked (ft) (Step 6,7): 30 Feet    AM-PAC:yes  OT Daily Activity Raw Score: 24  CMS 0-100% Score: 0.00%               Goals:  N/A           Discharge Recommendation: Home with supervision       Julieta Bellini, OTR/L  10/13/2017 12:34 PM

## 2017-10-13 NOTE — Plan of Care (Signed)
Problem: Speech Language Pathology  Goal: By discharge, patient will demonstrate cognition, communication and safe swallowing at the patient's highest functional potential.  See SLP evaluation(s)/note for goals.  Goals:  Pt will safely consume least restrictive diet using safe swallow strategies with no overt s/s of aspiration or penetration across all PO intake.       Plan/Recommendations:           Diet Solids Recommendation: Continue with current diet  Diet Liquids Recommendations: thin consistency  Precautions/Compensations: Awake/alert, Upright 90 degrees for all oral intake  Recommendation Discussed With: : Patient  SLP Frequency Recommended: monitor status  Administration of Medications: PO  Aspiration Precautions posted at bedside: Reviewed with patient     10/13/2017  Tor Netters, M.S., CCC-SLP  [employer] [phone]  Department #: [phone]

## 2017-10-13 NOTE — Plan of Care (Signed)
Problem: Safety  Goal: Patient will be free from injury during hospitalization  Outcome: Progressing   10/13/17 0825   Goal/Interventions addressed this shift   Patient will be free from injury during hospitalization  Assess patient's risk for falls and implement fall prevention plan of care per policy;Provide and maintain safe environment;Use appropriate transfer methods;Ensure appropriate safety devices are available at the bedside;Include patient/ family/ care giver in decisions related to safety;Hourly rounding     Educated patient on use of the call light. Encouraged to use for any needs. Patient verbalized understanding. Patient is a low fall risk.         Problem: Neurological Deficit  Goal: Neurological status is stable or improving  Outcome: Progressing   10/13/17 0825   Goal/Interventions addressed this shift   Neurological status is stable or improving Monitor/assess/document neurological assessment (Stroke: every 4 hours);Re-assess NIH Stroke Scale for any change in status;Perform CAM Assessment     Patient admitted with HA, dizziness, and nausea. MRI positive for infarction. Neurologist aware per notes. Continue with neuro checks and vital checks q4 hours. Encouraged patient to notify staff for any changes. Aspirin started this shift. Patient is on a statin. VTE prophylaxis is Lovenox. Will assess for lipid panel. Patient is alert and oriented x4. No neuro deficits. No complaints of HA this morning.             Problem: Every Day - Stroke  Goal: Core/Quality measure requirements - Daily  Outcome: Progressing   10/13/17 0825   Goal/Interventions addressed this shift   Core/Quality measure requirements - Daily  VTE Prevention: Ensure anticoagulant(s) administered and/or anti-embolism stockings/devices documented by end of day 2;Ensure antithrombotic administered or contraindication documented by LIP by end of day 2;Continue stroke education (must include Modifiable Risk Factors, Warning Signs and Symptoms  of Stroke, Activation of Emergency Medical System and Follow-up Appointments). Ensure handout has been given and documented.     Goal: Neurological status is stable or improving  Outcome: Progressing   10/13/17 0825   Goal/Interventions addressed this shift   Neurological status is stable or improving Monitor/assess/document neurological assessment (Stroke: every 4 hours);Re-assess NIH Stroke Scale for any change in status;Perform CAM Assessment     Patient admitted with HA, dizziness, and nausea. MRI positive for infarction. Neurologist aware per notes. Continue with neuro checks and vital checks q4 hours. Encouraged patient to notify staff for any changes. Aspirin started this shift. Patient is on a statin. VTE prophylaxis is Lovenox. Will assess for lipid panel. Patient is alert and oriented x4. No neuro deficits. No complaints of HA this morning.

## 2017-10-13 NOTE — Consults (Signed)
PICC Line Insertion Procedure Note    Procedure:   [x]  #5.5 FR Double Lumen Arrow PICC.  []  #4.5 FR Single Lumen Arrow PICC  []  #6 FR Triple Lumen Arrow PICC    LOT #: [id number]    Indications:  3% Saline    Procedure Details   Informed consent was obtained for the procedure. Patient/Family provided with education material.   Maximum sterile technique was used:    Nurse: Special educational needs teacher, Cap, Mask, Sterile Gown & Gloves         Patient: Large Sterile Head to Toe Drape         If Assistant or Observer present: Cap/Mask & clean gloves.          All syringes on the sterile field labeled.         Lidocaine 1% Local anesthetic as needed absent reported allergy.          MST w/Ultrasound and Real time directional guidance.     Insertion of #5.5 FR/18G Double Lumen PICC, inserted to the Right Basilic vein per hospital protocol. Tip location confirmed by ECG/VPS or Radiology, with positive blood return all lumens.    MST GUIDEWIRE & PICC STYLET, REMOVED INTACT BY VISUAL INSPECTION.     Findings:  Catheter trimmed Total Length: 44cm  Internal Length: 44cm  External Length: 0cm  Mid upper arm circumference is 33 cm.   Catheter was flushed with 10 cc NS.   Patient tolerated procedure well.  Number of attempts: 1  Est. Blood Loss: Minimal    Confirmation:  [x]     ECG                   []      Chest X-Ray        Recommendations:  Ready for immediate use. Please follow all post-procedure nursing orders.       Signed by: Roylene Reason, RN

## 2017-10-13 NOTE — Plan of Care (Signed)
Problem: Occupational Therapy  Goal: By discharge, patient will perform self-care at the patient's highest functional potential.  See OT evaluation/note for goals.  Outcome: Adequate for Discharge  Discharge Recommendation: Home with supervision        OT Frequency Recommended: therapy discontinued     Next Visit Recommendation:         Is PT evaluation indicated at this time? Yes, a PT evaluation is indicated at this time.    PMP Activity: Step 6 - Walks in Room  Distance Walked (ft) (Step 6,7): 30 Feet  (Please See Therapy Evaluation for device and assistance level needed)    Treatment Interventions: ADL retraining, Functional transfer training

## 2017-10-13 NOTE — Consults (Signed)
NEUROSURGERY - CONSULTATION NOTE      Date: 10/13/2017  Patient Name: [name],[name]    Please CC and send a report to:  Patient Care Team:  Bobby Rumpf, MD as PCP - General    Diagnosis / Chief Complaint:     L cerebellar CVA    History of Present Illness:     I was asked by Dr. Alona Bene to see [name] in consultation in order to render my professional opinion regarding the aforementioned chief complaint.    [name] is a 52 y.o. right-handed male with history of hypertension and Type II diabetes who presents 10/12/17 with complaints of headache, blurry vision, nausea/vomiting and imbalance x 4 days.  Patient states he was in his usual state of health until Sunday 10/09/17 when he developed new onset of the aforementioned symptoms while in [place].  He went to the ER there, where he underwent a head CT which was reportedly negative (per patient).  He was diagnosed with migraine headaches and ultimately discharged to home.  His symptoms persisted, which prompted him to come to [facility] ED.  At this time, he describes the nausea as persistent but well-controlled.  He ambulated to the bathroom this morning and reports that he felt steady on his feet with ambulation.  He describes the headaches and blurry vision as improved at this time.  He denies any new or worsening neurologic or pertinent constitutional symptoms.    He vomited at least twice today, and his nausea is persistent.    History was obtained from chart review and the patient.    Review of Systems:     A comprehensive review of systems was performed and revealed the following:    Constitutional: negative for - fever, chills, sweating, fatigue, change in activity  General: negative for - recent weight gain, recent weight loss, appetite change, headaches  Eyes: positive for - blurry vision, negative for - double vision, eye pain, light sensitivity, eye discharge  ENT: negative for - hearing loss, nasal discharge, hoarseness, sore  throat, swallowing difficulty  Heart: negative for - chest pain or tightness, palpitations, fainting, other heart trouble  Lungs: negative for - chronic cough, shortness of breath, wheezing, sleep apnea, hemoptysis  Gastrointestinal: positive for - nausea, vomiting, negative for - abdominal pain, diarrhea, constipation, bloody stool  Genito-Urinary: negative for - urinary retention, urinary incontinence, urinary urgency, urinary discharge  Musculoskeletal: negative for - joint swelling, joint pain, leg swelling, leg cramping, fractures  Neurological: positive for - headaches, imbalance, negative for - dizziness, tremors, memory loss, speech difficulty, seizures  Hematological and Lymphatic: negative for - easy bleeding, easy bruising, swollen nodes  Skin: negative for - rash, enlarged glands, moles or skin lesions, color change, infection  Psychaitric: negative for - anxiety, depression, confusion, excessive stress, suicidal thoughts    All other systems were reviewed by me and are negative.      Past Medical History:     Past Medical History:   Diagnosis Date   . Diabetes mellitus    . Gout    . Hypertension        Past Surgical History:     History reviewed. No pertinent surgical history.    Family History:     Family History   Problem Relation Age of Onset   . Diabetes Mother    . Hypertension Mother    . Hypertension Father    . Diabetes Father  Social History:     Social History     Social History   . Marital status: Single     Spouse name: N/A   . Number of children: N/A   . Years of education: N/A     Social History Main Topics   . Smoking status: Never Smoker   . Smokeless tobacco: Never Used   . Alcohol use No   . Drug use: No   . Sexual activity: Not Asked     Other Topics Concern   . None     Social History Narrative   . None       Allergies:     No Known Allergies    Medications:     Scheduled Meds:  Current Facility-Administered Medications   Medication Dose Route Frequency   . aspirin  81 mg Oral  Daily   . atorvastatin  40 mg Oral QHS   . enoxaparin  40 mg Subcutaneous Daily   . insulin lispro  1-4 Units Subcutaneous QHS   . insulin lispro  1-8 Units Subcutaneous TID AC   . lisinopril  40 mg Oral Daily     Continuous Infusions:  PRN Meds:.butalbital-acetaminophen-caffeine, naloxone, ondansetron **OR** ondansetron, senna-docusate      Vital Signs:     Vitals:    10/13/17 1200   BP: 140/80   Pulse: 77   Resp: 17   Temp: 97.7 F (36.5 C)   SpO2: 97%       Physical Exam:     General: No acute distress, cooperative with examination  Psychologic: Affect appropriate, judgment and insight consistent with situation, no delusions or hallucinations  Skin: Warm, dry, no obvious lesions or scars  Eyes: Sclerae anicteric, no conjunctival injection  ENT: No otorrhea, no rhinorrhea, trachea midline  Head: Normocephalic  Neck: No palpable masses  Musculoskeletal: Full ROM, normal muscle tone, no atrophy  Pulmonary: Normal respiratory effort, no audible wheezing  Cardiovascular: No pedal edema, pulses 2+ in bilat lower extremities  Abdominal: Non-tender to palpation, non-distended, no organomegaly, no palpable masses    Neuro exam:   Awake, alert, orientedx3  In mild distress 2/2 nausea/vomiting  Speech clear and fluent  Attention span normal  PERRL, EOMI nystagmus on left lateral gaze   Facial sensation intact  Face symmetric  Hearing intact  Tongue midline  Shoulder shrug strong bilaterally  Motor:   Arms:     Deltoid  Bicep Tricep Grip IO   Right 5 5 5 5 5    Left  5 5 5 5 5       Legs:     HF KE KF DF PF EHL   Right 5 5 5 5 5 5    Left  5 5 5 5 5 5      No pronator drift  Subtle L dysmetria  Rapid alternating movements intact  Light touch and pinprick intact in all 4 extremities  DTRs:     Biceps Triceps Brachiorad Patellar Ankle   Right 2+ 2+ 2+ 1+ 1+   Left  2+ 2+ 2+ 1+ 1+     No Hoffmann's sign bilat  No Clonus bilat  Gait normal  Mild difficulty with balancing on either leg R>L      Labs:     Lab Results   Component  Value Date    WBC 11.64 (H) 10/13/2017    HGB 14.4 10/13/2017    HCT 48.4 10/13/2017    MCV 88.3 10/13/2017    PLT 278 10/13/2017  Lab Results   Component Value Date    NA 138 10/12/2017    K 4.8 10/12/2017    CL 101 10/12/2017    CO2 24 10/12/2017     No results found for: INR, PT  Lab Results   Component Value Date    BUN 28.0 10/12/2017     Lab Results   Component Value Date    CREAT 1.2 10/12/2017         Imaging:     Radiology Results (24 Hour)     Procedure Component Value Units Date/Time    MRA Head (intracranial) Without Contrast [[id number]] Collected:  10/12/17 2049    Order Status:  Completed Updated:  10/12/17 2148    Narrative:       CLINICAL HISTORY:    subacute cerebellar stroke       COMPARISON: None available.    TECHNIQUE:  2-D time of flight noncontrast MR angiography was performed  through the neck arterial system. Coronal 3D gadolinium enhanced MRA was  performed for administration of 10 cc Gadavist. Multiplanar MIP  reconstructions were done from the dataset.    3-D time-of-flight methodology was utilized to assess the vessels at  the base of the brain and the major branches of the circle of Willis.  Multiplanar reconstructed images were created from the source data.      FINDINGS:  MRA Neck Vessels: NASCET criteria utilized for carotid vessel  measurements the subclavian arteries are obscured by artifact. There is  no  evidence of hemodynamically significant stenosis of the common and  internal carotid arteries in the neck.  There is patent antegrade flow  of the right vertebral artery. There is absence of antegrade flow in the  left vertebral artery. Small amount of enhancement noted in the distal  left vertebral artery indicative of collateral flow.    MRA Circle of Willis: There is absence of flow in the left vertebral  artery which appears occluded proximally. The distal right vertebral  artery demonstrates flow related enhancement and extensive to the  junction with the basilar artery.  The proximal basilar artery  demonstrates irregular narrowing and only minimal flow related  enhancement. There is flow related enhancement of the distal basilar  artery to the tip. Both PCAs are primarily supplied by the anterior  circulation.   Flow void the distal internal carotid arteries are normal in course and  caliber. The proximal portions of the anterior and middle cerebral  arteries are normal in course and caliber.        Impression:         1. MRA Neck Vessels: No significant carotid stenosis. Absent antegrade  flow in the left vertebral artery which appears occluded proximally.  2. 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 The anterior circulation is normal.  3. The findings were discussed with the attending neurologist at the  time of interpretation.    Heron Nay, MD   10/12/2017 9:44 PM    MRA Neck With / Without Contrast [[id number]] Collected:  10/12/17 2049    Order Status:  Completed Updated:  10/12/17 2148    Narrative:       CLINICAL HISTORY:    subacute cerebellar stroke       COMPARISON: None available.    TECHNIQUE:  2-D time of flight noncontrast MR angiography was performed  through the neck arterial system. Coronal 3D gadolinium enhanced MRA was  performed for administration of  10 cc Gadavist. Multiplanar MIP  reconstructions were done from the dataset.    3-D time-of-flight methodology was utilized to assess the vessels at  the base of the brain and the major branches of the circle of Willis.  Multiplanar reconstructed images were created from the source data.      FINDINGS:  MRA Neck Vessels: NASCET criteria utilized for carotid vessel  measurements the subclavian arteries are obscured by artifact. There is  no  evidence of hemodynamically significant stenosis of the common and  internal carotid arteries in the neck.  There is patent antegrade flow  of the right vertebral artery. There is absence of antegrade flow in  the  left vertebral artery. Small amount of enhancement noted in the distal  left vertebral artery indicative of collateral flow.    MRA Circle of Willis: There is absence of flow in the left vertebral  artery which appears occluded proximally. The distal right vertebral  artery demonstrates flow related enhancement and extensive to the  junction with the basilar artery. The proximal basilar artery  demonstrates irregular narrowing and only minimal flow related  enhancement. There is flow related enhancement of the distal basilar  artery to the tip. Both PCAs are primarily supplied by the anterior  circulation.   Flow void the distal internal carotid arteries are normal in course and  caliber. The proximal portions of the anterior and middle cerebral  arteries are normal in course and caliber.        Impression:         1. MRA Neck Vessels: No significant carotid stenosis. Absent antegrade  flow in the left vertebral artery which appears occluded proximally.  2. 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 The anterior circulation is normal.  3. The findings were discussed with the attending neurologist at the  time of interpretation.    Heron Nay, MD   10/12/2017 9:44 PM    MRI Brain With / Without Contrast [[id number]] Collected:  10/12/17 2105    Order Status:  Completed Updated:  10/12/17 2148    Narrative:       MRI BRAIN W WO CONTRAST  CLINICAL HISTORY:  Left cerebellar stroke, ataxia and posterior  headaches    COMPARISON: CT 10/12/2017    Comparison right.    TECHNIQUE: Multiplanar multisequence imaging performed. Following 10 cc  Gadavist axial and coronal T1-weighted images were obtained.     INTERPRETATION:  Hyperintensity and restricted diffusion noted in the  left cerebellar hemisphere inferiorly affecting the PICA territory.  There is associated mild degree of mass effect deforming the fourth  ventricle. T1-weighted and craniocervical sequence  is a straight  presence of superficial petechial type hemorrhage along the infarcted  territories especially inferiorly. No discrete hematoma. Mild  enhancement of the folia of the affected cerebellar territory noted.  Small foci of hyperintensity in the subcortical and periventricular  white matter. Tiny old discrete lacunar infarcts noted in the left  caudate. No acute ischemia noted affecting the cerebral hemispheres.  Absence of flow void of the left intradural vertebral artery present  compatible with the MR angiogram findings left vertebral artery  occlusion.      Impression:        Evolving left cerebellar hemisphere infarction with mild  mass effect and superficial petechial type hemorrhage. The findings were  discussed with the attending neurologist following interpretation.    Heron Nay, MD   10/12/2017 9:44 PM  Assessment:     52 y.o. male history of hypertension and type II DM who presented 10/12/17 with a 4-day history of headache, nausea/vomiting, blurry vision and ataxia  Head CT on arrival demonstrated extensive hypodensity throughout the left cerebellar hemisphere suggesting CVA  Subsequent MRI brain W WO contrast demonstrates Left cerebellar hemisphere infarct with mass effect upon the 4th ventricle without significant ventriculomegaly    Plan:     I had an extensive discussion with the patient regarding the condition. I showed the patient the imaging, and went over its findings in detail. I explained to the patient that at this time I recommend transferring him to the ICU for intesnive neurological and medical monitoring given the size and location of the stroke, as well as his persistent symptoms and exam findings. I also explained to him that we will be placing him on hypertonic saline and q1 hour neuro checks.  Head of bed will remain elevated above 30 degrees at all times.  We will also closely monitor sodium levels and SBP goals.  We will obtain stat head CT in the event of  neurologic decline.  I communicated this plan also with Dr Graciela Husbands and Dr Alona Bene. All questions the patient and his wife (over the phone) had were answered.    Thank you for the opportunity of allowing me to participate in the care of Mr. Havier Deeb.      Alexandra Elon Jester Laskey PA-C    Greig Castilla A. Irmgard Rampersaud, MD  Minimally Invasive and Complex Spine Surgery & General Neurosurgery  Department of Neurosciences  [facility]  Assistant Professor of Neurosurgery  [facility] Address: [address]  [place], [place] [address]  Office Phone [phone]  Appointments [phone]  Fax [phone]

## 2017-10-13 NOTE — Consults (Signed)
PULM / CCM CONSULTATION    [phone]    Date Time: 10/13/17 2:05 PM  Patient Name: [name]  Requesting Physician: Berenice Bouton, MD      Reason for Consultation:       Cerebellar stroke    Assessment:   Left cerebellar stroke  Cerebral edema  Intracerebral petechial hemorrhage  Midline shift  Ataxia  Vertigo  Hypertension  Diabetes mellitus      Plan:       Transfer patient to ICU for closer monitoring and treatment  Hypertonic saline  Maintain sodium between 1 45-1 55  Maintain blood pressure less than 140  Continue aspirin  Lipitor  Control blood sugar  Keep head of the bed elevated at 30 degrees  Neuro check every hour  Monitor BMP every 6 hour  History:   [name] is a 52 y.o. male who presents to the hospital on 10/12/2017 with     Ataxia headache 2 days prior to coming to the hospital.  Patient underwent CT scan in [place] where he lives which did not reveal any stroke.  Patient has been in this area working at [employer]  When his symptoms did not improve he came to [place] emergency room where a CT scan revealed suspicious stroke left cerebellum subsequent MRI revealed significant size of the stroke and left cerebellum with a midline shift given the location of the stroke patient is transferred to ICU for closer monitoring and hypertonic saline infusion    Past Medical History:     Past Medical History:   Diagnosis Date   . Diabetes mellitus    . Gout    . Hypertension        Past Surgical History:   History reviewed. No pertinent surgical history.    Family History:     Family History   Problem Relation Age of Onset   . Diabetes Mother    . Hypertension Mother    . Hypertension Father    . Diabetes Father        Social History:     Social History     Social History   . Marital status: Single     Spouse name: N/A   . Number of children: N/A   . Years of education: N/A     Social History Main Topics   . Smoking status: Never Smoker   . Smokeless tobacco: Never Used   . Alcohol use  No   . Drug use: No   . Sexual activity: Not on file     Other Topics Concern   . Not on file     Social History Narrative   . No narrative on file       Allergies:   No Known Allergies    Hospital Medications:     Current Facility-Administered Medications   Medication Dose Route Frequency   . aspirin  81 mg Oral Daily   . atorvastatin  40 mg Oral QHS   . enoxaparin  40 mg Subcutaneous Daily   . insulin lispro  1-4 Units Subcutaneous QHS   . insulin lispro  1-8 Units Subcutaneous TID AC   . lisinopril  40 mg Oral Daily       Home Medications:     Prescriptions Prior to Admission   Medication Sig Dispense Refill Last Dose   . lisinopril (PRINIVIL,ZESTRIL) 40 MG tablet Take 40 mg by mouth daily   10/12/2017 at Unknown time   .  colchicine 0.6 MG tablet 2 tabs by mouth for the 1st dose, then 1 tab 1 hour later. Starting day 2 may take 1 tab one or two times daily as tolerated for gout flare. 10 tablet 0    . Empagliflozin-metFORMIN HCl (SYNJARDY) 12.07-998 MG Tab Take by mouth   08/17/2017 at Unknown time   . HYDROcodone-acetaminophen (NORCO) 5-325 MG per tablet Take 1 tablet by mouth every 6 (six) hours as needed for Pain 10 tablet 0    . insulin glargine (TOUJEO MAX SOLOSTAR) 300 UNIT/ML injection pen Inject into the skin nightly   08/17/2017 at Unknown time   . liraglutide (VICTOZA) 18 MG/3ML injection Inject into the skin   08/17/2017 at Unknown time   . meloxicam (MOBIC) 7.5 MG tablet Take 2 tablets (15 mg total) by mouth daily 30 tablet 0        Code Status:      full code    Review of Systems:        General ROS:  Afebrile no weight loss weight gain   ENT ROS:  No sore throat no nasal discharge   Endocrine ROS:  fatigue   Respiratory ROS:  No shortness of breadth wheezing cough chest congestion    Cardiovascular ROS:  No chest pain or palpitation   Gastrointestinal ROS:  No nausea vomiting diarrhea.  No melanotic stool   Genito-Urinary ROS:  No burning in the urine or hematuria   Musculoskeletal ROS:  No  musculoskeletal deformities   Neurological ROS: Cerebellar stroke ataxia vertigo   Dermatological ROS:  No skin rash      Physical Exam:     Vitals:    10/13/17 1200   BP: 140/80   Pulse: 77   Resp: 17   Temp: 97.7 F (36.5 C)   SpO2: 97%         Intake/Output Summary (Last 24 hours) at 10/13/17 1405  Last data filed at 10/13/17 1000   Gross per 24 hour   Intake             2500 ml   Output                0 ml   Net             2500 ml           General appearance - no visible respiratory distress patient does not appear toxic  Mental status - Alert and oriented x3  Eyes - EOMI PERRLA  Nose - no nasal discharge  Mouth - mucous membrane is moist.  No evidence of sore throat  Neck - no JVD lymphadenopathy or thyromegaly  Chest - clear to auscultation  Heart - S1-S2 RRR no S3-S4 no murmur  Abdomen - soft nontender bowel sounds are normal with no hepatosplenomegaly  Neurological -left facial droop nystagmus with movement of the eyeball to the left.  Power 5/5 in all 4 extremities  Extremities - no edema clubbing or cyanosis  Skin - no skin rash  Capillary refill time less than 3 seconds  Labs:       CBC w/Diff CMP     Recent Labs  Lab 10/13/17  0354 10/12/17  0728   WBC 11.64* 6.71   Hgb 14.4 14.8   Hematocrit 48.4 49.7*   Platelets 278 271   MCV 88.3 89.2   Neutrophils  --  52.5       PT/INR  Recent Labs  Lab 10/12/17  0728   Sodium 138   Potassium 4.8   Chloride 101   CO2 24   BUN 28.0   Creatinine 1.2   Glucose 361*   Calcium 10.0   Protein, Total 7.8   Albumin 3.9   AST (SGOT) 15   ALT 17   Alkaline Phosphatase 78   Bilirubin, Total 0.5      Glucose POCT     Recent Labs  Lab 10/12/17  0728   Glucose 361*          Recent Labs  Lab 10/12/17  0728   Troponin I <0.01             ABGs:  No results found for: Paul Dykes, PHART, PCO2ART, PO2ART, HCO3ART, BEART, O2SATART    Urinalysis    Recent Labs  Lab 10/12/17  0728   Urine Type Urine, Clean Ca   Color, UA Straw   Clarity, UA Clear   Specific  Gravity UA 1.026   Urine pH 6.0   Nitrite, UA Negative   Ketones UA Negative   Urobilinogen, UA Negative   Bilirubin, UA Negative   Blood, UA Negative   RBC, UA 0 - 2   WBC, UA 0 - 5         Rads:     Radiology Results (24 Hour)     Procedure Component Value Units Date/Time    MRA Head (intracranial) Without Contrast [[id number]] Collected:  10/12/17 2049    Order Status:  Completed Updated:  10/12/17 2148    Narrative:       CLINICAL HISTORY:    subacute cerebellar stroke       COMPARISON: None available.    TECHNIQUE:  2-D time of flight noncontrast MR angiography was performed  through the neck arterial system. Coronal 3D gadolinium enhanced MRA was  performed for administration of 10 cc Gadavist. Multiplanar MIP  reconstructions were done from the dataset.    3-D time-of-flight methodology was utilized to assess the vessels at  the base of the brain and the major branches of the circle of Willis.  Multiplanar reconstructed images were created from the source data.      FINDINGS:  MRA Neck Vessels: NASCET criteria utilized for carotid vessel  measurements the subclavian arteries are obscured by artifact. There is  no  evidence of hemodynamically significant stenosis of the common and  internal carotid arteries in the neck.  There is patent antegrade flow  of the right vertebral artery. There is absence of antegrade flow in the  left vertebral artery. Small amount of enhancement noted in the distal  left vertebral artery indicative of collateral flow.    MRA Circle of Willis: There is absence of flow in the left vertebral  artery which appears occluded proximally. The distal right vertebral  artery demonstrates flow related enhancement and extensive to the  junction with the basilar artery. The proximal basilar artery  demonstrates irregular narrowing and only minimal flow related  enhancement. There is flow related enhancement of the distal basilar  artery to the tip. Both PCAs are primarily supplied by the  anterior  circulation.   Flow void the distal internal carotid arteries are normal in course and  caliber. The proximal portions of the anterior and middle cerebral  arteries are normal in course and caliber.        Impression:         1. MRA Neck Vessels: No significant carotid  stenosis. Absent antegrade  flow in the left vertebral artery which appears occluded proximally.  2. 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 The anterior circulation is normal.  3. The findings were discussed with the attending neurologist at the  time of interpretation.    Heron Nay, MD   10/12/2017 9:44 PM    MRA Neck With / Without Contrast [[id number]] Collected:  10/12/17 2049    Order Status:  Completed Updated:  10/12/17 2148    Narrative:       CLINICAL HISTORY:    subacute cerebellar stroke       COMPARISON: None available.    TECHNIQUE:  2-D time of flight noncontrast MR angiography was performed  through the neck arterial system. Coronal 3D gadolinium enhanced MRA was  performed for administration of 10 cc Gadavist. Multiplanar MIP  reconstructions were done from the dataset.    3-D time-of-flight methodology was utilized to assess the vessels at  the base of the brain and the major branches of the circle of Willis.  Multiplanar reconstructed images were created from the source data.      FINDINGS:  MRA Neck Vessels: NASCET criteria utilized for carotid vessel  measurements the subclavian arteries are obscured by artifact. There is  no  evidence of hemodynamically significant stenosis of the common and  internal carotid arteries in the neck.  There is patent antegrade flow  of the right vertebral artery. There is absence of antegrade flow in the  left vertebral artery. Small amount of enhancement noted in the distal  left vertebral artery indicative of collateral flow.    MRA Circle of Willis: There is absence of flow in the left vertebral  artery which appears  occluded proximally. The distal right vertebral  artery demonstrates flow related enhancement and extensive to the  junction with the basilar artery. The proximal basilar artery  demonstrates irregular narrowing and only minimal flow related  enhancement. There is flow related enhancement of the distal basilar  artery to the tip. Both PCAs are primarily supplied by the anterior  circulation.   Flow void the distal internal carotid arteries are normal in course and  caliber. The proximal portions of the anterior and middle cerebral  arteries are normal in course and caliber.        Impression:         1. MRA Neck Vessels: No significant carotid stenosis. Absent antegrade  flow in the left vertebral artery which appears occluded proximally.  2. 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 The anterior circulation is normal.  3. The findings were discussed with the attending neurologist at the  time of interpretation.    Heron Nay, MD   10/12/2017 9:44 PM    MRI Brain With / Without Contrast [[id number]] Collected:  10/12/17 2105    Order Status:  Completed Updated:  10/12/17 2148    Narrative:       MRI BRAIN W WO CONTRAST  CLINICAL HISTORY:  Left cerebellar stroke, ataxia and posterior  headaches    COMPARISON: CT 10/12/2017    Comparison right.    TECHNIQUE: Multiplanar multisequence imaging performed. Following 10 cc  Gadavist axial and coronal T1-weighted images were obtained.     INTERPRETATION:  Hyperintensity and restricted diffusion noted in the  left cerebellar hemisphere inferiorly affecting the PICA territory.  There is associated mild degree of mass effect deforming the fourth  ventricle. T1-weighted and craniocervical sequence is a straight  presence of superficial petechial type hemorrhage along the infarcted  territories especially inferiorly. No discrete hematoma. Mild  enhancement of the folia of the affected cerebellar territory noted.  Small  foci of hyperintensity in the subcortical and periventricular  white matter. Tiny old discrete lacunar infarcts noted in the left  caudate. No acute ischemia noted affecting the cerebral hemispheres.  Absence of flow void of the left intradural vertebral artery present  compatible with the MR angiogram findings left vertebral artery  occlusion.      Impression:        Evolving left cerebellar hemisphere infarction with mild  mass effect and superficial petechial type hemorrhage. The findings were  discussed with the attending neurologist following interpretation.    Heron Nay, MD   10/12/2017 9:44 PM      .    Gean Quint, MD  10/13/2017  2:05 PM

## 2017-10-13 NOTE — UM Notes (Signed)
10/13/17 0739  Admit to Inpatient Once     Admitting Physician HMAID, ABED K   Diagnosis CVA (cerebral vascular accident)   Estimated Length of Stay > or = to 2 midnights   Tentative Discharge Plan? Home or Self Care   Patient Class Inpatient     [facility] [phone] NON OPTIONS     10/12/17 Emergency Note  52 y.o. male with a hx of HTN and DM presenting to the ED with a HA to the back of the head for the past 2-3 days. The pain woke pt up from sleep. Pt has been taking Aleve with temporary improvement. He also notes feeling lightheaded with the HA along with some room spinning dizziness. Denies numbness, weakness, fevers, rhinorrhea, neck pain, sore throat, or chest pain.  Clinical Impression:   1. Acute nonintractable headache, unspecified headache type   2. Gait difficulty   3. Cerebrovascular accident (CVA), unspecified mechanism     10/12/17 Medicine Note  ASSESSMENT & PLAN  Patient Active Hospital Problem List:  Acute nonintractable headache, unspecified headache type, CVA (10/12/2017)  Assessment:  Acute   Plan:  Neurology is on board  HCT reviewed and noted  PT/OT/SP  MRI/MRA pending, continue Neuro checks  Continue ASA, allow permissive hypertension in view of possible CVA  Check Lipid pane, A1C  Further recs per Neurology    Gait instability with dizziness  Assessment: r/o stroke  Plan: Neuro is on board  PT/OT  Continue with same plan as above    History of hypertension (10/12/2017)  Assessment:  stable  Plan:  Resume home med with holding parameters  Allow permissive HTN     Uncontrolled type 2 diabetes mellitus with hyperglycemia, with long-term current use of insulin (10/12/2017)  Assessment:  Likely uncontrolled  Plan:  Correctional insulin  Accu checks, A1C  Diabetic education    Nutrition  Consistent Carb    DVT/VTE Prophylaxis   Lovenox    Anticipated medical stability for discharge:    Service status/Reason for ongoing hospitalization:   Anticipated Discharge Needs:      10/12/17 Neuro  Note  Assessment:  This is a 52 y/o male with a PMH significant for HTN and DM 2 who presents with HA, blurry vision, and imbalance since x 4 days    1.  HA, blurry vision, dizziness, and imbalance x 4 days. HCT reveals L cerebellar hypodensity, concerning for stroke which would fit clinical picture. Mechanism unclear, possibly thrombotic 2/2 small vessel occlusion though embolic sources must be ruled out       Plan:  -Recommend MRI brain, MRA head/neck  -Recommend TTE   -Continue baby ASA for now  -Check lipid panel, HbA1c  -No role for permissive HTN as symptoms are 4 days and stroke completed. Aim for normotension  -Tight BG control  -Telemetry   -PT/OT/Speech  -Medical management per primary team  -Further recommendations pending results of above           10/12/2017 07:28 10/13/2017 03:54   WBC 6.71 11.64 (H)   Hemoglobin 14.8 14.4   Hematocrit 49.7 (H) 48.4      10/12/2017 07:28   Glucose 361 (H)      10/13/2017 03:54   Cholesterol 262 (H)   HDL 51   Calculated LDL 179 (H)   Triglycerides 160 (H)       10/12/17 CT Note IMPRESSION:  Extensive hypodensity through the left cerebellar hemisphere most likely age-indeterminate ischemic change.    10/12/17 Chest XR:  IMPRESSION: No definite acute evaluate accounting for degree of an sprain    7/17 MRA neck:1. MRA Neck Vessels: No significant carotid stenosis. Absent antegrade flow in the left vertebral artery which appears occluded proximally.  2. 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 The anterior circulation is normal.  3. The findings were discussed with the attending neurologist at the time of interpretation.    10/12/17 MRI Brain: IMPRESSION: Evolving left cerebellar hemisphere infarction with mild mass effect and superficial petechial type hemorrhage. The findings were discussed with the attending neurologist following interpretation.    Scheduled Meds:  Current Facility-Administered Medications   Medication  Dose Route Frequency   . aspirin  81 mg Oral Daily   . atorvastatin  40 mg Oral QHS   . enoxaparin  40 mg Subcutaneous Daily   . insulin lispro  1-4 Units Subcutaneous QHS   . insulin lispro  1-8 Units Subcutaneous TID AC   . lisinopril  40 mg Oral Daily     Continuous Infusions:  . sodium chloride 100 mL/hr at 10/13/17 0358     PRN Meds:.butalbital-acetaminophen-caffeine, naloxone, ondansetron **OR** ondansetron, senna-docusate     Laury Axon BSN, RN  Utilization Review  [facility]   [phone]

## 2017-10-13 NOTE — Progress Notes (Signed)
[facility] Consultation Progress Note    Date Time: 10/13/17 8:54 AM  Patient Name: [name],[name]  Outpatient Neurologist :    CC: CVA      Assessment:   This is a 52 y/o male with a PMH significant for HTN and DM 2 who presents with HA, blurry vision, and imbalance since x 4 days. Out of window for tPA or IR intervention. HCT showed L cerebellar hypodensity,  concerning for stroke       1. L cerebellar hemisphere infarction/L vert occlusion. 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 Mechanism of stroke possibly thrombotic given diffuse intracranial stenosis, though other etiologies need to be ruled out such as cardio embolic vs hypercoagulable state      -MRI brain L cerebellar hemisphere infarction with associated mass effect and petechial hemorrhage   -MRA neck L [place] artery occlusion; stenosis/thrombosis of proximal basilar artery;  Both PCAs are primarily supplied by the anterior circulation.        Patient Active Problem List   Diagnosis   . Acute nonintractable headache, unspecified headache type   . History of hypertension   . Uncontrolled type 2 diabetes mellitus with hyperglycemia, with long-term current use of insulin   . CVA (cerebral vascular accident)       Plan:   -MRI/A reviewed  -TTE completed, f/u with final report. Would pursue TEE if TTE is negative given young age/rule out CE source  -Hypercoagulable panel has been ordered  -Contnue ASA for now. Will eventually need dual antiplatelets with ASA + Plavix given high grade basilar artery stenosis, though would wait at least 10 days from stroke onset ~7/24 given large stroke with petechial hemorrhage, would repeat HCT prior to initiation   -Recommend high dose statin therapy for secondary stroke prevention (LDL 179, goal <70)  -Tight BP/BG control   -Will need ongoing medical management of vascular risk factors for secondary stroke prevention   -Continue Fioricet PRN headache  -STAT HCT for any acute neurological  changes  -PT/OT  -Continue telemetry   -Medical management per primary team    Attending Physician Attestation:   The patient was seen and examined by me. Interval history reviewed. All pertinent parts of the neurological exam were performed and confirmed by me, with any additional findings on exam noted in bold. I agree with the assessment and plan as outlined by the advanced practice provider as above, with any additional considerations or recommendations as noted.    Patient reports mild nausea but no headache, lethargy, vision change, limb weakness.  Still feels unsteady when up and walking. Called overnight by radiology regarding MRI/MRA results.  Imaging consistent with left cerebellar infarct, with some mass effect.  Per rads, 4th ventricle patent and no signs of hydrocephalus.  Petechial hemorrhage into stroke bed noted.  Vascular imaging with left vertebral occlusion with stenosis vs. Occlusion of proximal basilar artery.  Flow seen in basilar distally.  Based on clinical history that symptoms started on Sunday, I do not feel that endovascular intervention nor heparinization is indicated for the basilar lesion.  However, given the basilar lesion, I feel that the potential benefits of ongoing antiplatelet therapy outweigh the risks of progression of the petechial hemorrhage/hemorrhagic conversion.  As patient is clinically stable with normal mental status, and since stroke onset several days ago, the likely risk of herniation and need for neurosurgical intervention is low.  Case was discussed via phone earlier today with neurosurgery and they will consult.  Neurology  will continue to follow.     De Hollingshead, MD  [facility]  Board Certified in Adult Neurology by the American Board of Psychiatry and Neurology (ABPN)  Board Certified in Headache Medicine by the [employer] (UCNS)  10/13/2017    Interval History/Subjective:   No acute neurological events documented or reported  overnight  Awake and alert, oriented, following commands  Still reports mild dizziness and blurry vision, improved since admission  Denies HA, neck pain, N/V    Medications:     Current Facility-Administered Medications   Medication Dose Route Frequency   . aspirin  81 mg Oral Daily   . atorvastatin  10 mg Oral QHS   . enoxaparin  40 mg Subcutaneous Daily   . insulin lispro  1-4 Units Subcutaneous QHS   . insulin lispro  1-8 Units Subcutaneous TID AC   . lisinopril  40 mg Oral Daily       Review of Systems:   Negative except for what is mentioned in the interval/subjective section       Physical Exam:   Temp:  [96.4 F (35.8 C)-98.1 F (36.7 C)] 96.8 F (36 C)  Heart Rate:  [70-98] 82  Resp Rate:  [15-18] 18  BP: (114-162)/(57-92) 162/91    Vital Signs:  Reviewed    General: The patient was well developed and well nourished.  No acute distress. Cooperative with the exam  Extremities: no pedal edema, extremities normal in color    Mental Status: The patient is awake, alert and oriented to person, place, and time.  Affect is blunted   Fund of knowledge appropriate  Recent and remote memory are intact   Attention span and concentration appear normal.  Speech clear, fluent. Follows commands     Cranial nerves:   -CN II: Visual fields full to bedside confrontation   -CN III, IV, VI: Pupils equal, round, and reactive to light; extraocular movements intact; + several beats of  nystagmus when looking to the L; no ptosis                                 -CN VII: Face symmetric   -CN VIII: Hearing intact to conversational speech   -CN IX, X:Normal phonation   -CN XII: Tongue protrudes midline    Motor: Muscle tone normal without spasticity or flaccidity. No atrophy.  Mild LUE pronation   Strength is 5/5 throughout     Sensory:   Light touch intact.  Temperature intact.      Reflexes:        Babinksis are negative     Coordination: Mild dysmetria on FTN on the L, intact on the R. HTS intact. No tremors    Gait:  Deferred 2/2 pt safety concerns     Labs:     Results     Procedure Component Value Units Date/Time    Glucose Whole Blood - POCT [[id number]]  (Abnormal) Collected:  10/13/17 0735     Updated:  10/13/17 0750     POCT - Glucose Whole blood 205 (H) mg/dL     CBC [[id number]]  (Abnormal) Collected:  10/13/17 0354    Specimen:  Blood from Blood Updated:  10/13/17 0500     WBC 11.64 (H) x10 3/uL      Hgb 14.4 g/dL      Hematocrit 32.4 %      Platelets 278 x10 3/uL  RBC 5.48 x10 6/uL      MCV 88.3 fL      MCH 26.3 pg      MCHC 29.8 (L) g/dL      RDW 13 %      MPV 9.7 fL      Nucleated RBC 0.0 /100 WBC      Absolute NRBC 0.00 x10 3/uL     Hemoglobin A1C [[id number]] Collected:  10/13/17 0354    Specimen:  Blood Updated:  10/13/17 0354    Narrative:       Fasting    Basic Metabolic Panel [[id number]] Collected:  10/13/17 0354    Specimen:  Blood Updated:  10/13/17 0354    Narrative:       Fasting    Lipid panel [[id number]] Collected:  10/13/17 0354    Specimen:  Blood Updated:  10/13/17 0354    Narrative:       Fasting specimen    Glucose Whole Blood - POCT [[id number]]  (Abnormal) Collected:  10/12/17 2044     Updated:  10/12/17 2112     POCT - Glucose Whole blood 269 (H) mg/dL     Glucose Whole Blood - POCT [[id number]]  (Abnormal) Collected:  10/12/17 1134     Updated:  10/12/17 1141     POCT - Glucose Whole blood 266 (H) mg/dL           Rads:   Ct Head Without Contrast    Result Date: 10/12/2017   Extensive hypodensity through the left cerebellar hemisphere most likely age-indeterminate ischemic change. Note: Note that CT scanning at this site  utilizes multiple dose reduction techniques including automatic exposure control, adjustment of the MAA and/or KVP according to patient's size and use of iterative reconstruction technique Laurena Slimmer, MD 10/12/2017 8:21 AM    Mra Head (intracranial) Without Contrast    Result Date: 10/12/2017  1. MRA Neck Vessels: No significant carotid stenosis. Absent antegrade flow in the left  vertebral artery which appears occluded proximally. 2. 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 The anterior circulation is normal. 3. The findings were discussed with the attending neurologist at the time of interpretation. Heron Nay, MD 10/12/2017 9:44 PM    Mra Neck With / Without Contrast    Result Date: 10/12/2017  1. MRA Neck Vessels: No significant carotid stenosis. Absent antegrade flow in the left vertebral artery which appears occluded proximally. 2. 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 The anterior circulation is normal. 3. The findings were discussed with the attending neurologist at the time of interpretation. Heron Nay, MD 10/12/2017 9:44 PM    Mri Brain With / Without Contrast    Result Date: 10/12/2017   Evolving left cerebellar hemisphere infarction with mild mass effect and superficial petechial type hemorrhage. The findings were discussed with the attending neurologist following interpretation. Heron Nay, MD 10/12/2017 9:44 PM    Chest Ap Portable    Result Date: 10/12/2017   No definite acute evaluate accounting for degree of an sprain Laurena Slimmer, MD 10/12/2017 8:12 AM        Signed by:  Ladoris Gene, FNP-BC  Nurse Practitioner  [facility]  Spectra: [facility] [phone]  [facility] [phone]    *Final recommendations per attending neurologist who will also see patient today and record notes.

## 2017-10-13 NOTE — Plan of Care (Addendum)
Problem: Day of Admission - Stroke  Goal: Core/Quality measure requirements - Admission  Outcome: Progressing   10/13/17 1547   Goal/Interventions addressed this shift   Core/Quality measure requirements - Admission Document NIH Stroke Scale on admission;Document nursing swallow/dysphagia screen on admission. If patient fails, keep patient NPO (follow your hospital protocol on swallowing screening).;VTE Prevention: Ensure anticoagulant(s) administered and/or anti-embolism stockings/devices documented as ordered;If diagnosis or history of Atrial Fib/Atrial Flutter, ensure oral anticoagulation is initiated or contraindication documented by LIP;Begin stroke education on admission (must include Modifiable Risk Factors, Warning Signs and Symptoms of Stroke, Activation of Emergency Medical System and Follow-up Appointments) Ensure handout has been given and documented.;Ensure PT/OT and/or SLP ordered   Problem: Every Day - Stroke  Goal: Core/Quality measure requirements - Daily  Outcome: Progressing    Pt received from [facility] for evolving stroke.  Report received, assessment completed (NIH score of 2) and orders reviewed.  Alert and oriented x4, Pt denies HA pain, but + for emesis, and dizziness (reports room spinning)  PT/OT/Speech seen patient, MD notified of pt's arrival and updated.  VARD aware of PICC line order. To start 3%NS once PICC line placed.  Continue to follow.

## 2017-10-13 NOTE — SLP Eval Note (Signed)
[facility]  Speech and Language Therapy Bedside Swallow Evaluation     Patient:  [name] MRN#:  [id number]  Unit:  25 [facility] Room/Bed:  H0865/H8469-G    Time of Treatment:   Time Calculation  SLP Received On: 10/13/17  Start Time: 1225  Stop Time: 1235  Time Calculation (min): 10 min    Consult received for [name] for SLP Bedside Swallow Evaluation and Treatment.    Medical Diagnosis: Gait difficulty [R26.9]  Acute nonintractable headache, unspecified headache type [R51]  Cerebrovascular accident (CVA), unspecified mechanism [I63.9]    History of Present Illness: [name] is a 52 y.o. male admitted on 10/12/2017 with a PMHx of hypertension and diabetes who presents with complaints of intractable headaches associated with dizziness and nausea since Sunday.  Described headache as pressure initially located on the Left side of his head now generalized.  Headache woke him up from sleep this morning and is progressively worsening associated with room spinning sensation.  Headache is not relieved with Aleve and BC powder.  States he went to a ED in [place], head CT was normal.  Denies numbness, tingling, weakness, fever, abdominal pain nausea vomiting diarrhea neck pain trauma and other complaints      In the emergency room, chest x-ray with no acute process, head CT showing age indeterminate ischemic change, and neurology consult, MRI pending    Patient Active Problem List   Diagnosis   . Acute nonintractable headache, unspecified headache type   . History of hypertension   . Uncontrolled type 2 diabetes mellitus with hyperglycemia, with long-term current use of insulin   . CVA (cerebral vascular accident)        Past Medical/Surgical History:  Past Medical History:   Diagnosis Date   . Diabetes mellitus    . Gout    . Hypertension       History reviewed. No pertinent surgical history.      History/Current Status:  Current Status  Respiratory Status:  within normal limits  Behavior/Mental Status: Able to follow directions, Cooperative, Lethargic  Nutrition: oral    Subjective: Patient is agreeable to participation in the therapy session. Nursing clears patient for therapy. Patient's medical condition is appropriate for Speech therapy intervention at this time.    Objective:  Observation of Patient/Vital Signs:  Patient is in bed with telemetry in place.    Oral Motor Skills:  Engineer, maintenance (IT) Skills: within functional limits    Deglutition Skills:  Deglutition Skills  Position: upright 90 degrees  Food(s) Tested: thin liquid, solid  Oral Stage: bolus formation/control reduced, slow but effective, chewing reduced, slow but effective, residuals       Oral Stage Residuals: Scattered  Pharyngeal Stage: adequate  Esophageal Stage: regurgitation    Assessment:   Pt finishing with OT evaluation when clinician entered the room.  OT reported pt threw up at the end of her evaluation.  RN at bedside and stated pt has not had any difficulty swallowing medications.  Pt awake, but kept his eyes closed during the evaluation.  Pt alert and Ox3.  Pt given approximately 4oz thin liquids and solids.  Pt demonstrated adequate labial seal with slow but efficient mastication with suspected slightly reduced A-P transit and timely swallow initiation.  Adequate hyolaryngeal elevation and excursion upon digital palpitation with no overt s/s of aspiration or penetration.      Goals:  Pt will safely consume least restrictive diet  using safe swallow strategies with no overt s/s of aspiration or penetration across all PO intake.       Plan/Recommendations:           Diet Solids Recommendation: Continue with current diet  Diet Liquids Recommendations: thin consistency  Precautions/Compensations: Awake/alert, Upright 90 degrees for all oral intake  Recommendation Discussed With: : Patient  SLP Frequency Recommended: monitor status  Administration of Medications: PO  Aspiration  Precautions posted at bedside: Reviewed with patient     10/13/2017  [name], M.S., CCC-SLP  [facility] [facility] [phone]  Department #: [phone]

## 2017-10-13 NOTE — Progress Notes (Signed)
[facility]  PROGRESS NOTE      Patient: [name]  Date: 10/13/2017   LOS: 1 Days  Admission Date: 10/12/2017   MRN: [id number]  Attending: Berenice Bouton  Please contact me on the following Pager [phone]       ASSESSMENT/PLAN     [name] is a 52 y.o. male admitted with Acute nonintractable headache, unspecified headache type, CT scan and MRI was done, Pt found to have Lt cerebellar CVA, Neurology and neurosurgery was consulted and the patient was transferred to ICU for 3% NS.      Interval Summary:     Patient Active Hospital Problem List:    L cerebellar hemisphere infarction  Acute nonintractable headache, unspecified headache type  - CT scan reviewed  - MRI/ MRA reviewed  - Echo reviewed  - Pt will need TEE   - ASA and statin  - Neurology onboard  - Neurosurgery consult   - Will transfer to ICU for 3% NS and close monitoring  - Still have N/V  - Zofran  - Neuro check   - Discussed with ICU, neurology and neurosurgery     Gait instability with dizziness  - 2/2 above  - Treatment as above    History of hypertension (10/12/2017)  - Stable  - Monitor BP    Uncontrolled type 2 diabetes mellitus with hyperglycemia, with long-term current use of insulin (10/12/2017)  - Uncontrolled  - Correctional insulin        Nutrition: Regular    DVT Prophylaxis:Lovenox        Code Status: Full    DISPO: TBD    Family Contact:        SUBJECTIVE     [name] states He feel beter than yesterday, but start having N/V this AM again    MEDICATIONS     Current Facility-Administered Medications   Medication Dose Route Frequency   . aspirin  81 mg Oral Daily   . atorvastatin  40 mg Oral QHS   . enoxaparin  40 mg Subcutaneous Daily   . insulin lispro  1-4 Units Subcutaneous QHS   . insulin lispro  1-8 Units Subcutaneous TID AC   . lisinopril  40 mg Oral Daily       PHYSICAL EXAM     Vitals:    10/13/17 1200   BP: 140/80   Pulse: 77   Resp: 17   Temp: 97.7 F (36.5 C)   SpO2: 97%       Temperature: Temp  Min:  96.4 F (35.8 C)  Max: 97.9 F (36.6 C)  Pulse: Pulse  Min: 70  Max: 85  Respiratory: Resp  Min: 16  Max: 18  Non-Invasive BP: BP  Min: 114/57  Max: 162/72  Pulse Oximetry SpO2  Min: 97 %  Max: 99 %    Intake and Output Summary (Last 24 hours) at Date Time    Intake/Output Summary (Last 24 hours) at 10/13/17 1427  Last data filed at 10/13/17 1000   Gross per 24 hour   Intake             2500 ml   Output                0 ml   Net             2500 ml         GEN APPEARANCE: Normal;  A&OX3  HEENT: PERLA; EOMI; Conjunctiva Clear  NECK: Supple; No bruits  CVS: RRR, S1, S2; No M/G/R  LUNGS: CTAB; No Wheezes; No Rhonchi: No rales  ABD: Soft; No TTP; + Normoactive BS  EXT: No edema; Pulses 2+ and intact  Skin exam:  pink  NEURO: CN 2-12 intact; No Focal neurological deficits  CAP REFILL:  Normal  MENTAL STATUS:  Normal    Exam done by Berenice Bouton, MD on 10/13/17 at 2:27 PM      LABS       Recent Labs  Lab 10/13/17  0354 10/12/17  0728   WBC 11.64* 6.71   RBC 5.48 5.57   Hgb 14.4 14.8   Hematocrit 48.4 49.7*   MCV 88.3 89.2   Platelets 278 271         Recent Labs  Lab 10/12/17  0728   Sodium 138   Potassium 4.8   Chloride 101   CO2 24   BUN 28.0   Creatinine 1.2   Glucose 361*   Calcium 10.0         Recent Labs  Lab 10/12/17  0728   ALT 17   AST (SGOT) 15   Bilirubin, Total 0.5   Albumin 3.9   Alkaline Phosphatase 78         Recent Labs  Lab 10/12/17  0728   Troponin I <0.01             Microbiology Results     Procedure Component Value Units Date/Time    MRSA culture - Nares [[id number]] Collected:  10/13/17 1405    Specimen:  Body Fluid from Nares Updated:  10/13/17 1405    MRSA culture - Throat [[id number]] Collected:  10/13/17 1405    Specimen:  Body Fluid from Throat Updated:  10/13/17 1405           RADIOLOGY     Upon my review:    Signed,  Sophina Mitten K Neeko Pharo  2:27 PM 10/13/2017

## 2017-10-14 ENCOUNTER — Inpatient Hospital Stay: Payer: No Typology Code available for payment source

## 2017-10-14 LAB — CBC
Absolute NRBC: 0 10*3/uL (ref 0.00–0.00)
Hematocrit: 46.3 % (ref 37.6–49.6)
Hgb: 14 g/dL (ref 12.5–17.1)
MCH: 27 pg (ref 25.1–33.5)
MCHC: 30.2 g/dL — ABNORMAL LOW (ref 31.5–35.8)
MCV: 89.2 fL (ref 78.0–96.0)
MPV: 9.6 fL (ref 8.9–12.5)
Nucleated RBC: 0 /100 WBC (ref 0.0–0.0)
Platelets: 258 10*3/uL (ref 142–346)
RBC: 5.19 10*6/uL (ref 4.20–5.90)
RDW: 13 % (ref 11–15)
WBC: 7.22 10*3/uL (ref 3.10–9.50)

## 2017-10-14 LAB — BASIC METABOLIC PANEL
Anion Gap: 8 (ref 5.0–15.0)
Anion Gap: 8 (ref 5.0–15.0)
BUN: 17 mg/dL (ref 9.0–28.0)
BUN: 15 mg/dL (ref 9.0–28.0)
CO2: 26 mEq/L (ref 22–29)
CO2: 25 mEq/L (ref 22–29)
Calcium: 9.3 mg/dL (ref 8.5–10.5)
Calcium: 9 mg/dL (ref 8.5–10.5)
Chloride: 107 mEq/L (ref 100–111)
Chloride: 109 mEq/L (ref 100–111)
Creatinine: 0.8 mg/dL (ref 0.7–1.3)
Creatinine: 0.9 mg/dL (ref 0.7–1.3)
Glucose: 201 mg/dL — ABNORMAL HIGH (ref 70–100)
Glucose: 217 mg/dL — ABNORMAL HIGH (ref 70–100)
Potassium: 4.5 mEq/L (ref 3.5–5.1)
Potassium: 4.5 mEq/L (ref 3.5–5.1)
Sodium: 141 mEq/L (ref 136–145)
Sodium: 142 mEq/L (ref 136–145)

## 2017-10-14 LAB — ANA PANEL
ANA Qualitative: NEGATIVE
ANA RNP: 22 (ref 0–99)
Anti-DNA (DS) Antibody Quantitative: 39 (ref 0–99)
Centromere Interpretation: NEGATIVE
Centromere: 8 (ref 0–99)
Histone Interpretation: NEGATIVE
Histone: 5 (ref 0–99)
Jo-1 Interpretation: NEGATIVE
Jo-1: 20 (ref 0–99)
Ribonucleoprotein (RNP) Antibody Interpretation: NEGATIVE
SM Interpretation: NEGATIVE
SM: 18 (ref 0–99)
SSA Interpretation: NEGATIVE
Scleroderma (Scl-70) Antibody Interpretation: NEGATIVE
Scleroderma SCL-70: 17 (ref 0–99)
Sjogrens SSA (Ro) Antibody: 10 (ref 0–99)
Sjogrens SSB (La) Antibody Interpretation: NEGATIVE
Sjogrens SSB (La) Antibody: 8 (ref 0–99)
dsDNA Interpretation: NEGATIVE

## 2017-10-14 LAB — HEMOLYSIS INDEX
Hemolysis Index: 13 (ref 0–18)
Hemolysis Index: 18 (ref 0–18)

## 2017-10-14 LAB — MRSA CULTURE
Culture MRSA Surveillance: NEGATIVE
Culture MRSA Surveillance: NEGATIVE

## 2017-10-14 LAB — FACTOR 7 ACTIVITY: Factor VII Activity: 130 % (ref 58–133)

## 2017-10-14 LAB — SODIUM
Sodium: 144 mEq/L (ref 136–145)
Sodium: 144 mEq/L (ref 136–145)
Sodium: 145 mEq/L (ref 136–145)
Sodium: 145 mEq/L (ref 136–145)

## 2017-10-14 LAB — GFR
EGFR: >60
EGFR: >60

## 2017-10-14 LAB — GLUCOSE WHOLE BLOOD - POCT
Whole Blood Glucose POCT: 218 mg/dL — ABNORMAL HIGH (ref 70–100)
Whole Blood Glucose POCT: 234 mg/dL — ABNORMAL HIGH (ref 70–100)
Whole Blood Glucose POCT: 215 mg/dL — ABNORMAL HIGH (ref 70–100)
Whole Blood Glucose POCT: 275 mg/dL — ABNORMAL HIGH (ref 70–100)

## 2017-10-14 LAB — FACTOR 8 ACTIVITY: Factor VIII Activity: 446 % — ABNORMAL HIGH (ref 50–137)

## 2017-10-14 MED ORDER — HYDRALAZINE HCL 20 MG/ML IJ SOLN
10.00 mg | INTRAMUSCULAR | Status: DC | PRN
Start: 2017-10-14 — End: 2017-10-18
  Administered 2017-10-14 – 2017-10-15 (×5): 10 mg via INTRAVENOUS
  Filled 2017-10-14 (×5): qty 1

## 2017-10-14 MED ORDER — ONDANSETRON HCL 4 MG/2ML IJ SOLN
4.00 mg | INTRAMUSCULAR | Status: DC | PRN
Start: 2017-10-14 — End: 2017-10-18
  Administered 2017-10-14 – 2017-10-15 (×5): 4 mg via INTRAVENOUS
  Filled 2017-10-14 (×5): qty 2

## 2017-10-14 MED ORDER — ONDANSETRON 4 MG PO TBDP
4.00 mg | ORAL_TABLET | Freq: Four times a day (QID) | ORAL | Status: DC | PRN
Start: 2017-10-14 — End: 2017-10-18
  Administered 2017-10-17: 05:00:00 4 mg via ORAL
  Filled 2017-10-14: qty 1

## 2017-10-14 NOTE — Plan of Care (Signed)
Problem: Safety  Goal: Patient will be free from infection during hospitalization  Outcome: Progressing   10/13/17 2100   Goal/Interventions addressed this shift   Free from Infection during hospitalization Assess and monitor for signs and symptoms of infection;Monitor lab/diagnostic results

## 2017-10-14 NOTE — Progress Notes (Signed)
CM spoke with pt re: discharge plan. Pt is admitted due to L cerebellar CVA.  Pt lives with his [name]([phone]). Pt is independent pta.  Pt has good family support.  No past DME, HH or SNF.  PICC. 3%NS. Neuro and neurosurgery f/w.pt is receptive to [facility].awaiting for PT/OT recs.  DCP is [facility] and fiance to transport.  CM will continue to f/u with needs.     10/14/17 0954   Patient Type   Within 30 Days of Previous Admission? No   Healthcare Decisions   Interviewed: Patient   Orientation/Decision Making Abilities of Patient Alert and Oriented x3, able to make decisions   Advance Directive Patient does not have advance directive   Healthcare Agent Appointed Other (Comment)  [name], mother, [phone], [name], [phone])   Healthcare Agent's Name n/a   Healthcare Agent's Phone Number n/a   Additional Emergency Contacts? see facesheet   Prior to admission   Prior level of function Independent with ADLs;Ambulates independently   Type of Residence Private residence   Home Layout Multi-level   Have running water, electricity, heat, etc? Yes   How do you get to your MD appointments? drives   How do you get your groceries? drives   Who fixes your meals? pt   Who does your laundry? pt   Who picks up your prescriptions? pt   Dressing Independent   Grooming Independent   Feeding Independent   Bathing Independent   Toileting Independent   DME Currently at Home (n/a)   Home Care/Community Services None   Prior SNF admission? (Detail) n/a   Prior Rehab admission? (Detail) n/a   Adult Protective Services (APS) involved? No   Discharge Planning   Support Systems Spouse/significant other;Family members   Patient expects to be discharged to: home   Anticipated St.  plan discussed with: Same as interviewed   Potential barriers to discharge: (n/a)   Mode of transportation: Private car (family member)   Does the patient have perscription coverage? Yes   Consults/Providers   PT Evaluation Needed 1    OT Evalulation Needed 1   Correct PCP listed in Epic? No (comment)  (pt has PCP in [place], [place] but unsure of the Dr's name.)   PCP   PCP on file was verified as the current PCP? Provider not on file   PCP - middle name (pt has PCP in [place], [place] but unsure of the Dr's name.)   Important Message from [facility] Notice   Patient received 1st IMM Letter? n/a   [name]  Case Management  [phone]

## 2017-10-14 NOTE — Progress Notes (Addendum)
[facility] Consultation Progress Note    Date Time: 10/14/17 10:39 AM  Patient Name: [name],[name]  Outpatient Neurologist :    CC: CVA      Assessment:   52 y/o male with a PMH significant for HTN and DM 2 who presented 7/17 with a 4 day hx of HA, blurry vision, and imbalance. Out of window for tPA or IR intervention. HCT showed L cerebellar hypodensity consistent with stroke     1. L cerebellar hemisphere infarction/L vert occlusion. 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 Mechanism of stroke possibly thrombotic given diffuse intracranial stenosis, though cannot exclude a cardio embolic event vs hypercoagulable state      -MRI brain L cerebellar hemisphere infarction with associated mass effect and petechial hemorrhage   -MRA neck L [place] artery occlusion; stenosis/thrombosis of proximal basilar artery;  Both PCAs are primarily supplied by the anterior circulation.        Patient Active Problem List   Diagnosis   . Acute nonintractable headache, unspecified headache type   . History of hypertension   . Uncontrolled type 2 diabetes mellitus with hyperglycemia, with long-term current use of insulin   . CVA (cerebral vascular accident)       Plan:   -Continue ASA 81 mg and statin for 2/2 stroke prevention. Will eventually need dual antiplatelets with ASA + Plavix given basilar artery stenosis, though would defer for now given infarction size and petechial hemorrhage  -Would check a repeat head CT to evaluate for improvement in posterior circulation mass effect.   -Hypertonic saline per neurosurgery however would not recommend prolonged treatment given basilar stenosis/thrombosis and risk of dehydration associated hyperviscosity  -Would pursue a TEE to rule out a CE source  -Hypercoagulable panel in process  -PT/OT/SLP  -Will continue to follow and make recommendations as appropriate.    Attending Physician Attestation:   The patient was seen and examined by me. Interval history reviewed. All  pertinent parts of the neurological exam were performed and confirmed by me, with any additional findings on exam noted in bold. I agree with the assessment and plan as outlined by the advanced practice provider as above, with any additional considerations or recommendations as noted.    Evaluated by Neurosurgery and transferred to ICU for hypertonic saline.  Had headache overnight, relieved with meds.  Continues to feel nauseated.  No new symptoms.  Exam with continued nystagmus, truncal ataxia.      Will check follow up head CT to assess degree of mass effect.  If mass effect is stable to improving, my preference would be to avoid prolonged hypertonic therapy given the basilar lesion noted on MRA and risk of clot extension.     Also discussed antiplatelet use with patient.  As there is petechial hemorrhage in stroke bed, there is risk of hemorrhagic conversion and this would be exacerbated with antiplatelet use.  However, I am concerned for extension of his basilar thrombus and feel antiplatelet therapy is warranted to prevent thrombosis.  Risks/benefits discussed with patient and he agreed with continuation of aspirin.  Should head CT show acute hemorrhage, will revisit this plan.     De Hollingshead, MD  [facility]  Board Certified in Adult Neurology by the American Board of Psychiatry and Neurology (ABPN)  Board Certified in Headache Medicine by the [employer] ([employer]NS)  10/14/2017      Interval History/Subjective:   No acute neurological events documented or reported overnight  Continues to endorse nausea/emesis. Using Zofran and Phenergan with some temporary relief  Still reports mild dizziness and visual obscurity yet improving  Headache last PM resolved with medication  Denies diplopia    TTE without CSE. Bubble study not done        Medications:     Current Facility-Administered Medications   Medication Dose Route Frequency   . aspirin  81 mg Oral Daily   . atorvastatin  40 mg  Oral QHS   . enoxaparin  40 mg Subcutaneous Daily   . insulin lispro  1-4 Units Subcutaneous QHS   . insulin lispro  1-8 Units Subcutaneous TID AC   . lisinopril  40 mg Oral Daily   . sodium chloride (PF)  10 mL Intracatheter Daily       Review of Systems:   Negative except for what is mentioned in the interval/subjective section       Physical Exam:   Temp:  [97.7 F (36.5 C)-98.3 F (36.8 C)] 97.7 F (36.5 C)  Heart Rate:  [68-94] 79  Resp Rate:  [17-20] 18  BP: (114-176)/(57-99) 176/87    Vital Signs:  Reviewed    General: The patient is well developed and well nourished.  No acute distress. Cooperative with the exam  Extremities: no pedal edema, extremities normal in color    Mental Status: The patient is awake, alert and oriented to person, place, and time.  Affect is blunted   Attention span and concentration appear normal.  Speech clear, fluent. NO dysarthria. Follows commands     Cranial nerves:   -CN II: Visual fields appear grossly full to bedside confrontation   -CN III, IV, VI: Pupils equal, round, and reactive to light; extraocular movements intact; + nystagmus with left and up gaze; no ptosis                                 -CN VII: Face symmetric   -CN VIII: Hearing intact to conversational speech   -CN IX, X:Normal phonation   -CN XII: Tongue protrudes midline    Motor: Muscle tone normal without spasticity or flaccidity. No atrophy.  Strength is 5/5 throughout     Sensory: Light touch intact.    Reflexes: Deferred    Coordination: Minimal dysmetria on the left with FTN, intact on the R. No tremors    Gait: Deferred 2/2 pt safety concerns     Labs:     Results     Procedure Component Value Units Date/Time    GFR [[id number]] Collected:  10/14/17 0730     Updated:  10/14/17 0814     EGFR >60.0    Basic Metabolic Panel [[id number]]  (Abnormal) Collected:  10/14/17 0730    Specimen:  Blood Updated:  10/14/17 0814     Glucose 217 (H) mg/dL      BUN 78.2 mg/dL      Creatinine 0.9 mg/dL       Calcium 9.0 mg/dL      Sodium 956 mEq/L      Potassium 4.5 mEq/L      Chloride 109 mEq/L      CO2 25 mEq/L      Anion Gap 8.0    Hemolysis index [[id number]] Collected:  10/14/17 0730     Updated:  10/14/17 0814     Hemolysis Index 18    Glucose Whole Blood - POCT [[id number]]  (Abnormal) Collected:  10/14/17  0732     Updated:  10/14/17 0740     POCT - Glucose Whole blood 218 (H) mg/dL     GFR [[id number]] Collected:  10/14/17 0202     Updated:  10/14/17 0234     EGFR >60.0    Basic Metabolic Panel [[id number]]  (Abnormal) Collected:  10/14/17 0202    Specimen:  Blood Updated:  10/14/17 0234     Glucose 201 (H) mg/dL      BUN 07.3 mg/dL      Creatinine 0.8 mg/dL      Calcium 9.3 mg/dL      Sodium 710 mEq/L      Potassium 4.5 mEq/L      Chloride 107 mEq/L      CO2 26 mEq/L      Anion Gap 8.0    Hemolysis index [[id number]] Collected:  10/14/17 0202     Updated:  10/14/17 0234     Hemolysis Index 13    CBC [[id number]]  (Abnormal) Collected:  10/14/17 0202    Specimen:  Blood from Blood Updated:  10/14/17 0217     WBC 7.22 x10 3/uL      Hgb 14.0 g/dL      Hematocrit 81.8 %      Platelets 258 x10 3/uL      RBC 5.19 x10 6/uL      MCV 89.2 fL      MCH 27.0 pg      MCHC 30.2 (L) g/dL      RDW 13 %      MPV 9.6 fL      Nucleated RBC 0.0 /100 WBC      Absolute NRBC 0.00 x10 3/uL     MRSA culture - Nares [[id number]] Collected:  10/13/17 1405    Specimen:  Body Fluid from Nares Updated:  10/13/17 2205    MRSA culture - Throat [[id number]] Collected:  10/13/17 1405    Specimen:  Body Fluid from Throat Updated:  10/13/17 2205    Glucose Whole Blood - POCT [[id number]]  (Abnormal) Collected:  10/13/17 2154     Updated:  10/13/17 2156     POCT - Glucose Whole blood 205 (H) mg/dL     Basic Metabolic Panel [[id number]]  (Abnormal) Collected:  10/13/17 2058    Specimen:  Blood Updated:  10/13/17 2143     Glucose 253 (H) mg/dL      BUN 14.4 mg/dL      Creatinine 0.9 mg/dL      Calcium 9.4 mg/dL      Sodium 315 mEq/L      Potassium 4.6 mEq/L       Chloride 104 mEq/L      CO2 24 mEq/L      Anion Gap 10.0    Hemolysis index [[id number]] Collected:  10/13/17 2058     Updated:  10/13/17 2143     Hemolysis Index 16    GFR [[id number]] Collected:  10/13/17 2058     Updated:  10/13/17 2143     EGFR >60.0    Basic Metabolic Panel [[id number]]  (Abnormal) Collected:  10/13/17 1640    Specimen:  Blood Updated:  10/13/17 1726     Glucose 240 (H) mg/dL      BUN 82.5 mg/dL      Creatinine 1.0 mg/dL      Calcium 9.7 mg/dL      Sodium 053 mEq/L      Potassium 4.4 mEq/L      Chloride 101  mEq/L      CO2 26 mEq/L      Anion Gap 10.0    Hemolysis index [[id number]]  (Abnormal) Collected:  10/13/17 1640     Updated:  10/13/17 1726     Hemolysis Index 21 (H)    GFR [[id number]] Collected:  10/13/17 1640     Updated:  10/13/17 1726     EGFR >60.0    Glucose Whole Blood - POCT [[id number]]  (Abnormal) Collected:  10/13/17 1655     Updated:  10/13/17 1719     POCT - Glucose Whole blood 229 (H) mg/dL     Factor VII activity [[id number]] Collected:  10/13/17 1144    Specimen:  Blood Updated:  10/13/17 1630    Factor VIII activity [[id number]] Collected:  10/13/17 1144    Specimen:  Blood Updated:  10/13/17 1630    Homocysteine, serum [[id number]] Collected:  10/13/17 1144    Specimen:  Blood Updated:  10/13/17 1516     HOMOCYSTEINE 7.26 umol/L     ANA PANEL [[id number]] Collected:  10/13/17 1144     Updated:  10/13/17 1457    Glucose Whole Blood - POCT [[id number]]  (Abnormal) Collected:  10/13/17 1303     Updated:  10/13/17 1305     POCT - Glucose Whole blood 260 (H) mg/dL     Fibrinogen [[id number]]  (Abnormal) Collected:  10/13/17 1144    Specimen:  Blood Updated:  10/13/17 1250     Fibrinogen 479 (H) mg/dL     Plasminogen activity [[id number]] Collected:  10/13/17 1144    Specimen:  Blood Updated:  10/13/17 1236    Narrative:       Call Lab first    Protein C Activity [[id number]] Collected:  10/13/17 1144    Specimen:  Blood Updated:  10/13/17 1236    Narrative:       platelet-poor plasma     Protein S Activity, Rflx Prot Ag Tot/Fre [[id number]] Collected:  10/13/17 1144     Updated:  10/13/17 1236    Factor 5 leiden [[id number]] Collected:  10/13/17 1144    Specimen:  Blood Updated:  10/13/17 1222    Methyltetrahydrofolate Reductase MTHFR [[id number]] Collected:  10/13/17 1144     Updated:  10/13/17 1222    Prothrombin (Factor II) G20210A Mutation Analysis [[id number]] Collected:  10/13/17 1144    Specimen:  Blood Updated:  10/13/17 1222    Anti-Cardiolipin Antibody Panel 1 [[id number]] Collected:  10/13/17 1144     Updated:  10/13/17 1222          Rads:   Ct Head Without Contrast    Result Date: 10/12/2017   Extensive hypodensity through the left cerebellar hemisphere most likely age-indeterminate ischemic change. Note: Note that CT scanning at this site  utilizes multiple dose reduction techniques including automatic exposure control, adjustment of the MAA and/or KVP according to patient's size and use of iterative reconstruction technique Laurena Slimmer, MD 10/12/2017 8:21 AM    Mra Head (intracranial) Without Contrast    Result Date: 10/12/2017  1. MRA Neck Vessels: No significant carotid stenosis. Absent antegrade flow in the left vertebral artery which appears occluded proximally. 2. 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 The anterior circulation is normal. 3. The findings were discussed with the attending neurologist at the time of interpretation. Heron Nay, MD 10/12/2017 9:44 PM    Mra Neck With / Without Contrast    Result Date: 10/12/2017  1. MRA Neck Vessels: No significant carotid stenosis. Absent antegrade flow in the left vertebral artery which appears occluded proximally. 2. 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 The anterior circulation is normal. 3. The findings were discussed with the attending neurologist at the time of interpretation. Heron Nay, MD 10/12/2017 9:44  PM    Mri Brain With / Without Contrast    Result Date: 10/12/2017   Evolving left cerebellar hemisphere infarction with mild mass effect and superficial petechial type hemorrhage. The findings were discussed with the attending neurologist following interpretation. Heron Nay, MD 10/12/2017 9:44 PM    Chest Ap Portable    Result Date: 10/12/2017   No definite acute evaluate accounting for degree of an sprain Laurena Slimmer, MD 10/12/2017 8:12 AM        Signed by:  Katheren Shams, FNP-C  Nurse Practitioner  [facility]  Daytime Spectralink: [phone]  Pager: [phone]  After 5:00 pm: [phone]

## 2017-10-14 NOTE — Plan of Care (Signed)
Problem: Neurological Deficit  Goal: Neurological status is stable or improving  Outcome: Progressing   10/13/17 2100   Goal/Interventions addressed this shift   Neurological status is stable or improving Monitor/assess/document neurological assessment (Stroke: every 4 hours);Monitor/assess NIH Stroke Scale;Observe for seizure activity and initiate seizure precautions if indicated;Re-assess NIH Stroke Scale for any change in status;Perform CAM Assessment       Problem: Every Day - Stroke  Goal: Neurological status is stable or improving  Outcome: Progressing   10/13/17 2100   Goal/Interventions addressed this shift   Neurological status is stable or improving Monitor/assess/document neurological assessment (Stroke: every 4 hours);Monitor/assess NIH Stroke Scale;Observe for seizure activity and initiate seizure precautions if indicated;Re-assess NIH Stroke Scale for any change in status;Perform CAM Assessment       Comments: Pt AOx4.  Pulses palpable.  GCS 15.  Performed Neuro assessment at beginning of shift with outgoing RN.   Performed NIH stroke scale assessment, total score 0.  PERRLA 4. Bilateral grips strong.  Dorsi flexion and plantar flexion present and strong.  Na level at 138.  Will continue to monitor q4h.  Next draw at 1am.

## 2017-10-14 NOTE — Plan of Care (Signed)
Problem: Physical Therapy  Goal: By discharge, patient will perform mobility at the patient's highest functional potential. See PT evaluation/note for goals.  Outcome: Progressing  Discharge Recommendation: Other(Comment) (to be determined pending further eval; ARF vs HH vs OP PT)  DME Recommended for Discharge: Other (Comment) (gt device to be determined pending further eval)    Is an Occupational Therapy Evaluation Indicated at this time? No, the patient does not require a OT evaluation.(eval and d/c 7/18)    Treatment/Interventions: Exercise, Gait training, Stair training, Neuromuscular re-education, Patient/family training, Equipment eval/education  PT Frequency: 3-4x/wk     Next Visit Recommendation: PT - Next Visit Recommendation: 10/15/17     PMP Activity: Step 4 - Dangle at Bedside  Distance Walked (ft) (Step 6,7): 0 Feet  (Please See Therapy Evaluation for device and assistance level needed)    Goals:   Goals  Goal Formulation: With patient  Time for Goal Acheivement: By time of discharge  Goals: Select goal  Pt Will Ambulate: 151-200 feet, with stand by assist (with least restrictive device)  Pt Will Go Up / Down Stairs: 3-5 stairs, With rail, with contact guard assist  Pt Will Perform Home Exer Program: with stand by assist

## 2017-10-14 NOTE — Plan of Care (Addendum)
Problem: Neurological Deficit  Goal: Neurological status is stable or improving  Outcome: Progressing   10/14/17 1656   Goal/Interventions addressed this shift   Neurological status is stable or improving Monitor/assess/document neurological assessment (Stroke: every 4 hours);Observe for seizure activity and initiate seizure precautions if indicated       Problem: Every Day - Stroke  Goal: Core/Quality measure requirements - Daily  Outcome: Progressing    Goal: Neurological status is stable or improving  Outcome: Progressing   10/14/17 1656   Goal/Interventions addressed this shift   Neurological status is stable or improving Monitor/assess/document neurological assessment (Stroke: every 4 hours);Observe for seizure activity and initiate seizure precautions if indicated       Comments: Pt is alert and oriented, moves all extremities. Neuro status remains unchanged, positive nystagmus when pt looks to left upper direction. On neuro check q 1hr. Continuously feeling nauseous and vomited several times. Prn zofran and phenergan given with some effects. Very mild headache, manageable per pt. Ambulatory to the BR with 1 assist. Sodium level check q 4hr as goal is between 145 to 155. Sodium was 144 at 11 am and 3pm, MD notified, ordered to continue current rate.  SR with HR 80S-90S, tachy with HR in 110s-120s when pt gets up or moves. On prn hydralazine q 4hr to maintain goal of SBP<140. Some ST depression noted on the strip, EKG done didn't show much change from previous one.  Fair appetite, ate some food. No BM today.  Urinated 3 times amber clear urine, adequate amount.    Plan: cont with 3% saline, sodium check q 4hr, antiemetic management.

## 2017-10-14 NOTE — Progress Notes (Signed)
[facility]  PROGRESS NOTE      Patient: [name]  Date: 10/14/2017   LOS: 2 Days  Admission Date: 10/12/2017   MRN: [id number]  Attending: Maple Mirza  Please contact me on the following Pager [phone]       ASSESSMENT/PLAN     [name] is a 52 y.o. male admitted with Acute nonintractable headache, unspecified headache type, CT scan and MRI was done, Pt found to have Lt cerebellar CVA, Neurology and neurosurgery was consulted and the patient was transferred to ICU for 3% NS.      Interval Summary:     Patient Active Hospital Problem List:    L cerebellar hemisphere infarction  Acute nonintractable headache, unspecified headache type  Midline shift  - CT scan reviewed  - MRI/ MRA reviewed  - Echo reviewed  - Pt will need TEE when stable  - ASA and statin  - Neurology onboard- will eventually need ASA + Plavix, but defer for now  - Neurosurgery consult   - In ICU for 3% NS and close monitoring  - Still has N/V  - Zofran  - Neuro check     Gait instability with dizziness  - 2/2 above  - Treatment as above    History of hypertension (10/12/2017)  - Stable  - Monitor BP    Uncontrolled type 2 diabetes mellitus with hyperglycemia, with long-term current use of insulin (10/12/2017)  - Uncontrolled  - Correctional insulin        Nutrition: Regular    DVT Prophylaxis:Lovenox        Code Status: Full    DISPO: TBD    Family Contact:        SUBJECTIVE     [name] with severe nausea, vomiting. Headache. No fever chills sob or chest pain.    MEDICATIONS     Current Facility-Administered Medications   Medication Dose Route Frequency   . aspirin  81 mg Oral Daily   . atorvastatin  40 mg Oral QHS   . enoxaparin  40 mg Subcutaneous Daily   . insulin lispro  1-4 Units Subcutaneous QHS   . insulin lispro  1-8 Units Subcutaneous TID AC   . lisinopril  40 mg Oral Daily   . sodium chloride (PF)  10 mL Intracatheter Daily       PHYSICAL EXAM     Vitals:    10/14/17 1700   BP: 148/67   Pulse: 92   Resp:     Temp:    SpO2: 97%       Temperature: Temp  Min: 97.7 F (36.5 C)  Max: 98.3 F (36.8 C)  Pulse: Pulse  Min: 68  Max: 132  Respiratory: Resp  Min: 18  Max: 18  Non-Invasive BP: BP  Min: 114/68  Max: 176/87  Pulse Oximetry SpO2  Min: 96 %  Max: 99 %    Intake and Output Summary (Last 24 hours) at Date Time    Intake/Output Summary (Last 24 hours) at 10/14/17 1742  Last data filed at 10/14/17 1700   Gross per 24 hour   Intake          1328.33 ml   Output              600 ml   Net           728.33 ml         GEN APPEARANCE: eyes closed, uncomfortable NAD  HEENT:  PERLA; EOMI; Conjunctiva Clear  NECK: Supple; No bruits  CVS: RRR, S1, S2; No M/G/R  LUNGS: CTAB; No Wheezes; No Rhonchi: No rales  ABD: Soft; No TTP; + Normoactive BS  EXT: No edema; Pulses 2+ and intact  Skin exam:  pink  NEURO: nystagmus. No ataxia on finger nose finger testing. Strength intact  CAP REFILL:  Normal  MENTAL STATUS:  Normal    Exam done by Scarlette Ar, MD on 10/14/17 at 2:27 PM      LABS       Recent Labs  Lab 10/14/17  0202 10/13/17  0354 10/12/17  0728   WBC 7.22 11.64* 6.71   RBC 5.19 5.48 5.57   Hgb 14.0 14.4 14.8   Hematocrit 46.3 48.4 49.7*   MCV 89.2 88.3 89.2   Platelets 258 278 271         Recent Labs  Lab 10/14/17  1505 10/14/17  1114 10/14/17  0730 10/14/17  0202 10/13/17  2058 10/13/17  1640 10/12/17  0728   Sodium 144 144 142 141 138 137 138   Potassium  --   --  4.5 4.5 4.6 4.4 4.8   Chloride  --   --  109 107 104 101 101   CO2  --   --  25 26 24 26 24    BUN  --   --  15.0 17.0 19.0 21.0 28.0   Creatinine  --   --  0.9 0.8 0.9 1.0 1.2   Glucose  --   --  217* 201* 253* 240* 361*   Calcium  --   --  9.0 9.3 9.4 9.7 10.0         Recent Labs  Lab 10/12/17  0728   ALT 17   AST (SGOT) 15   Bilirubin, Total 0.5   Albumin 3.9   Alkaline Phosphatase 78         Recent Labs  Lab 10/12/17  0728   Troponin I <0.01             Microbiology Results     Procedure Component Value Units Date/Time    MRSA culture - Nares [[id number]]  Collected:  10/13/17 1405    Specimen:  Body Fluid from Nares Updated:  10/13/17 1405    MRSA culture - Throat [[id number]] Collected:  10/13/17 1405    Specimen:  Body Fluid from Throat Updated:  10/13/17 1405           RADIOLOGY     Upon my review:    Janalee Dane [place] Natarajan  5:42 PM 10/14/2017

## 2017-10-14 NOTE — Progress Notes (Signed)
Spoke with lab.  Blood sample hemolyzed.  Will repeat sample.

## 2017-10-14 NOTE — Progress Notes (Signed)
Bedside assessment performed with Melissa B. RN

## 2017-10-14 NOTE — Progress Notes (Signed)
ICU Daily Progress Note        Date Time: 10/14/17 9:11 AM  Patient Name: [name]  Attending Physician: Maple Mirza, MD  Room: [phone]   Admit Date: 10/12/2017  LOS: 2 days      ICU problem list: Cerebellar stroke, increased intracranial pressure    Assessment:     .  Cerebellar infarction  .  Midline shift with increased intraocular pressure  .  Mild hemorrhagic transformation  .  Hypertension  .  Diabetes mellitus    Plan:     .  Cerebellar infarction, continue to monitor in ICU, started PT and OT  .  Hypertonic saline, monitor sodium level  .  Stable neurological status further recommendations as per neurology  .  Continue lisinopril  .  On sliding scale follow blood sugar    Discussed with patient  Discussed with bedside nurse    Code Status: full code    Subjective:     Awake, comfortable    Review of Systems:     A comprehensive review of systems was: General ROS: negative for - chills,  fever or night sweats  Ophthalmic ROS: negative for - double vision, excessive tearing, itchy eyes or photophobia  ENT ROS: negative for - headaches, nasal congestion, sore throat or vertigo  Respiratory ROS: negative for - cough, hemoptysis, orthopnea, pleuritic pain, shortness of breath, tachypnea or wheezing  Cardiovascular ROS: negative for - chest pain, edema, orthopnea, rapid heart rate  Gastrointestinal ROS: negative for - abdominal pain, blood in stools, constipation, diarrhea, heartburn or nausea/vomiting  Musculoskeletal ROS: negative for - positive for some ataxia   Neurological ROS: negative for - confusion, dizziness, speech problems, visual changes or weakness, positive for headache  Dermatological ROS: negative for dry skin, pruritus and rash    Medications:      Scheduled Meds: PRN Meds:      aspirin 81 mg Oral Daily   atorvastatin 40 mg Oral QHS   enoxaparin 40 mg Subcutaneous Daily   insulin lispro 1-4 Units Subcutaneous QHS   insulin lispro 1-8 Units Subcutaneous TID AC    lisinopril 40 mg Oral Daily   sodium chloride (PF) 10 mL Intracatheter Daily       Continuous Infusions:  . sodium chloride 50 mL/hr at 10/14/17 0800      acetaminophen 650 mg Q4H PRN   butalbital-acetaminophen-caffeine 1 tablet Q6H PRN   naloxone 0.2 mg PRN   ondansetron 4 mg Q6H PRN   Or     ondansetron 4 mg Q6H PRN   promethazine 12.5 mg Q6H PRN   senna-docusate 2 tablet Daily PRN         Physical Exam:     General Appearance:  alert, and in no distress  Neuro:  alert, oriented, normal speech, no focal findings or movement disorder noted  Neck:supple, no significant adenopathy  Lungs: clear to auscultation, no wheezes, rales or rhonchi, symmetric air entry  Cardiac: normal rate, regular rhythm, normal S1, S2, no murmurs, rubs, clicks or gallops  Abdomen:  soft, nontender, nondistended, no masses or organomegaly  Extremities: peripheral pulses normal, no pedal edema, no clubbing or cyanosis   Skin:   normal coloration and turgor, no rashes, no suspicious skin lesions noted  GU:   Void     Data:     Invasive ICU Hemodynamics:    IBW/kg (Calculated) : 66.1    Patient Lines/Drains/Airways Status    Active PICC Line / CVC Line /  PIV Line / Drain / Airway / Intraosseous Line / Epidural Line / ART Line / Line / Wound / Pressure Ulcer / NG/OG Tube     Name:   Placement date:   Placement time:   Site:   Days:    PICC Double Lumen 10/13/17 Right Basilic  10/13/17    1700    Basilic    less than 1    Peripheral IV 10/12/17 Right Antecubital  10/12/17    1601    Antecubital    1                VITAL SIGNS   Temp:  [97.7 F (36.5 C)-98.3 F (36.8 C)] 97.7 F (36.5 C)  Heart Rate:  [68-94] 81  Resp Rate:  [17-20] 18  BP: (114-169)/(57-90) 146/85  Blood Glucose:  Pulse ox:  Telemetry:       Intake/Output Summary (Last 24 hours) at 10/14/17 0911  Last data filed at 10/14/17 0910   Gross per 24 hour   Intake          1436.33 ml   Output              200 ml   Net          1236.33 ml        Labs:     CBC w/Diff CMP     Recent  Labs  Lab 10/14/17  0202 10/13/17  0354 10/12/17  0728   WBC 7.22 11.64* 6.71   Hgb 14.0 14.4 14.8   Hematocrit 46.3 48.4 49.7*   Platelets 258 278 271   MCV 89.2 88.3 89.2   Neutrophils  --   --  52.5          PT/INR           Recent Labs  Lab 10/14/17  0730 10/14/17  0202 10/13/17  2058  10/12/17  0728   Sodium 142 141 138 More results in Results Review 138   Potassium 4.5 4.5 4.6 More results in Results Review 4.8   Chloride 109 107 104 More results in Results Review 101   CO2 25 26 24  More results in Results Review 24   BUN 15.0 17.0 19.0 More results in Results Review 28.0   Creatinine 0.9 0.8 0.9 More results in Results Review 1.2   Glucose 217* 201* 253* More results in Results Review 361*   Calcium 9.0 9.3 9.4 More results in Results Review 10.0   Protein, Total  --   --   --   --  7.8   Albumin  --   --   --   --  3.9   AST (SGOT)  --   --   --   --  15   ALT  --   --   --   --  17   Alkaline Phosphatase  --   --   --   --  78   Bilirubin, Total  --   --   --   --  0.5   More results in Results Review = values in this interval not displayed.    Recent Labs  Lab 10/12/17  0728   Troponin I <0.01       @lababg @   Glucose POCT     Recent Labs  Lab 10/14/17  0730 10/14/17  0202 10/13/17  2058 10/13/17  1640 10/12/17  0728   Glucose 217* 201* 253* 240* 361*  Rads:   Radiological Imaging personally reviewed.    I have personally reviewed the patient's history and 24 hour interval events, along with vitals, labs, radiology images.     So far today I have spent 35 minutes providing care for this patient excluding teaching and billable procedures, and not overlapping with any other providers.    Lattie Haw, MD  Pulmonary and critical care  10/14/17

## 2017-10-14 NOTE — Progress Notes (Signed)
NEUROSURGERY ICU PROGRESS NOTE    Patient Name: [name],[name]  Attending Physician:Dr. Jonathon Jordan  Covered By: Mackie Pai [facility] PA [phone]/ pager [phone])    Assessment:     52 y.o. male history of hypertension and type II DM who presented 10/12/17 with a 4-day history of headache, nausea/vomiting, blurry vision and ataxia  Head CT on arrival demonstrated extensive hypodensity throughout the left cerebellar hemisphere suggesting CVA  Subsequent MRI brain W WO contrast demonstrates Left cerebellar hemisphere infarct with mass effect upon the 4th ventricle without significant ventriculomegaly    Plan:     Continue medical management per primary team  Q1H neuro checks  Continue to monitor serum Na and SBP   Head CT STAT for neuro decline  Plan of care as discussed with Dr. Irving Burton    Interim History:     NAEO    Na 142 (141)  SBP 160s-170s this am    CC: reports persistent but improving blurry vision and vomiting  Denies any new or worsening symptoms    Medications:     Current Facility-Administered Medications   Medication Dose Route Frequency   . aspirin  81 mg Oral Daily   . atorvastatin  40 mg Oral QHS   . enoxaparin  40 mg Subcutaneous Daily   . insulin lispro  1-4 Units Subcutaneous QHS   . insulin lispro  1-8 Units Subcutaneous TID AC   . lisinopril  40 mg Oral Daily   . sodium chloride (PF)  10 mL Intracatheter Daily       Physical Exam:     Vitals:    10/14/17 1002   BP: 176/87   Pulse:    Resp:    Temp:    SpO2:        Neuro exam:  Awake, alert, orientedx3  Speech clear and fluent  Attention span normal  PERRL, EOMI nystagmus on left lateral gaze   Facial sensation intact  Face symmetric  Hearing intact  Tongue midline  Shoulder shrug strong bilaterally  Motor:              Arms:     Deltoid  Bicep Tricep Grip IO   Right 5 5 5 5 5    Left  5 5 5 5 5                  Legs:     HF KE KF DF PF EHL   Right 5 5 5 5 5 5    Left  5 5 5 5 5 5      No pronator drift  Subtle L  dysmetria  Light touch and pinprick intact in all 4 extremities    Labs:     Lab Results   Component Value Date    WBC 7.22 10/14/2017    HGB 14.0 10/14/2017    HCT 46.3 10/14/2017    MCV 89.2 10/14/2017    PLT 258 10/14/2017     Lab Results   Component Value Date    NA 142 10/14/2017    K 4.5 10/14/2017    CL 109 10/14/2017    CO2 25 10/14/2017     No results found for: INR, PT  Lab Results   Component Value Date    BUN 15.0 10/14/2017     Lab Results   Component Value Date    CREAT 0.9 10/14/2017       Rads:     Radiology Results (24 Hour)     **  No results found for the last 24 hours. **            Signed by: Alford Highland PA-C  Date/Time: 10/14/17 10:41 AM          NEUROSURGERY ATTENDING ADDENDUM    I met, interviewed and examined the patient today with my Pa-C, Adriana Simas. I also reviewed the patient's medications, vitals, lab results and imaging studies myself. I agree with the above note.    Thank you for the opportunity of allowing me to participate in the care of [name], MD  Minimally Invasive and Complex Spine Surgery & General Neurosurgery  Department of Neurosciences  [facility]  Assistant Professor of Neurosurgery  [facility] Address: [address], [address]  [place], [place] [address]  Office Phone [phone]  Appointments [phone]  Fax [phone]

## 2017-10-14 NOTE — PT Eval Note (Signed)
[name]  Physical Therapy Evaluation    Patient: [name]  MRN#: [id number]  Unit: [facility]  Bed: A2714/A2714-01    Time of Evaluation:  Time Calculation  PT Received On: 10/14/17  Start Time: 1519  Stop Time: 1536  Time Calculation (min): 17 min    Chart Review and Collaboration with Care Team: 10 minutes, not included in above time    PT Visit Number: 1    Consult received for [name] for PT Evaluation and Treatment.  Patient's medical condition is appropriate for Physical therapy intervention at this time.    Activity Orders:  PT eval and treat and progressive mobility protocol    Precautions and Contraindications:  Precautions  Weight Bearing Status: no restrictions  Other Precautions: falls; limb RUE    Medical Diagnosis:  Gait difficulty [R26.9]  Acute nonintractable headache, unspecified headache type [R51]  Cerebrovascular accident (CVA), unspecified mechanism [I63.9]    History of Present Illness:  [name] is a 52 y.o. male admitted on 10/12/2017  with a PMH significant for HTN and DM 2 who presented 7/17 with a 4 day hx of HA, blurry vision, and imbalance. Out of window for tPA or IR intervention. HCT showed L cerebellar hypodensity consistent with stroke      Patient Active Problem List   Diagnosis   . Acute nonintractable headache, unspecified headache type   . History of hypertension   . Uncontrolled type 2 diabetes mellitus with hyperglycemia, with long-term current use of insulin   . CVA (cerebral vascular accident)       Past Medical/Surgical History:  Past Medical History:   Diagnosis Date   . Diabetes mellitus    . Gout    . Hypertension      History reviewed. No pertinent surgical history.    X-Rays/Tests/Labs:  Lab Results   Component Value Date/Time    HGB 14.0 10/14/2017 02:02 AM    HCT 46.3 10/14/2017 02:02 AM    K 4.5 10/14/2017 07:30 AM    NA 144 10/14/2017 03:05 PM    TROPI <0.01 10/12/2017 07:28 AM        All imaging reviewed, please see chart for details.    Social History:  Prior Level of Function  Prior level of function: Independent with ADLs, Ambulates independently  Baseline Activity Level: Community ambulation  Driving: independent  Cooking: Yes  Employment: FT  DME Currently at Home:  (n/a)    Home Living Arrangements  Living Arrangements: Spouse/significant other  Type of Home: House  Home Layout: Multi-level, Able to live on main level with bedroom/bathroom (2 STE)  Bathroom Shower/Tub: Walk-in shower (w/ grab bars)  Bathroom Toilet: Raised  Bathroom Equipment: Grab bars in Medical laboratory scientific officer Accessibility: Accessible via walker  DME Currently at Home:  (n/a)  Home Living - Notes / Comments: currently in area for work Ap-Sept, hotel reisdent; plans return [place] post d/c    Subjective:  Patient is agreeable to participation in the therapy session. Nursing clears patient for therapy.  Patient Goal: manage nausea  Pain Assessment  Pain Assessment: No/denies pain          Objective:  Observation of Patient/Vital Signs:  Supine 138/76, pulse 98, PSO2 99%  Sitting c/o dizziness, increased nausea. BP 168/102, pulse 102.  Returned to supine BP 158/83, pulse 83. Notified RN    Patient received in bed with telemetry , intravenous (IV) line and SCDs in place.  Inspection/Posture: slight backward rot L side in sitting; thorax slightly R of pelvis in supine    Cognitive Status and Neuro Exam:  Cognition/Neuro Status  Arousal/Alertness: Appropriate responses to stimuli  Attention Span: Appears intact  Orientation Level: Oriented X4  Memory: Appears intact  Following Commands: Follows all commands and directions without difficulty  Safety Awareness: minimal verbal instruction  Insights: Decreased awareness of deficits  Comments: participation limited by N/V; maintained eye closure unless vision req'd  Behavior: calm;cooperative (limited by nausea)  Motor Planning: intact  Coordination: intact    Musculoskeletal  Examination  Gross ROM  Right Upper Extremity ROM: within functional limits  Left Upper Extremity ROM: within functional limits  Right Lower Extremity ROM: within functional limits  Left Lower Extremity ROM: within functional limits  Gross Strength  Right Upper Extremity Strength: within functional limits  Left Upper Extremity Strength: within functional limits  Right Lower Extremity Strength: within functional limits  Left Lower Extremity Strength: within functional limits       Functional Mobility:  Functional Mobility  Rolling: Independent;to Right  Supine to Sit: Independent;to Right  Scooting to EOB: Independent  Sit to Supine: Independent;to Right  Sit to Stand: Unable to assess (Comment) (deferred 2/2 increased BP, HR; dizziness/nausea in sitting)  Functional Mobility Deferred (Comment): standing/amb deferred 2/2 increased BP, HR; dizziness/nausea in sitting     Locomotion  Ambulation: Unable to assess (Comment) (deferred 2/2 increased BP, HR; dizziness/nausea in sitting)    Balance  Balance  Balance: Other (Comment) (indep static sitting; needs further assessment)    Participation and Activity Tolerance  Participation and Endurance  Participation Effort: other (comment) (limited 2/2 increased BP, HR; dizziness/nausea in sitting)  Endurance: Tolerates < 10 min exercise with changes in vital signs  [place] Dyspnea Scale: 0 Dyspnea    Educated the patient to role of physical therapy, plan of care, goals of therapy.    Patient left in bed with bed alarm in place and call bell and all personal items/needs within reach. RN notified of session outcome.      Assessment:  [name] is a 52 y.o. male admitted 10/12/2017. Pt would benefit from Physical Therapy to address deficits and increase functional independence. There are few comorbidities or other factors that affect plan of care and require modification of task including:BP fluctuations, diabetes, residual neurological symptoms,  vertigo/dizziness.  Pt's functional mobility is impacted by: activity tolerance/endurance, balance, coordination/motor control, dizziness and gait.  Standardized tests and exams incorporated into evaluation include AMPAC mobility.   Pt demonstrates an evolving clinical presentation due to abnormal vital signs, change in presentation and ICU/NICU.     Evaluation limited to supine/sitting motor exam due to elevated BP in sitting, increased c/o dizziness/nausea.  Presents with subtle postural deviations, subtle control changes L trunk and shoulder. ? Visual deficit as maintained eye closure throughout eval except when absolutely necessary; also noted 1 instance R eye closure(though denies diplopia or blurred vision.)  Needs further PT evaluation for standing balance, gt to best determine d/c plan, equipment needs.     Complexity Level Hx and Co-  morbidites Examination Clinical Decision Making Clinical Presentation   Moderate   1-2 factors 3 or more   Several options Evolving, plan may alter       Plan:  Treatment/Interventions: Exercise, Gait training, Stair training, Neuromuscular re-education, Patient/family training, Equipment eval/education  PT Frequency: 3-4x/wk  Risks/Benefits/POC Discussed with Pt/Family: With patient    PMP Activity: Step 4 - Dangle  at Bedside  Distance Walked (ft) (Step 6,7): 0 Feet      AM-PAC:yes        PT Basic Mobility Raw Score: 18  CMS 0-100% Score: 46.58%               Goals:  Goals  Goal Formulation: With patient  Time for Goal Acheivement: By time of discharge  Goals: Select goal  Pt Will Ambulate: 151-200 feet, with stand by assist (with least restrictive device)  Pt Will Go Up / Down Stairs: 3-5 stairs, With rail, with contact guard assist  Pt Will Perform Home Exer Program: with stand by assist         DME Recommended for Discharge: Other (Comment) (gt device to be determined pending further eval)  Discharge Recommendation: Other(Comment) (to be determined pending further eval;  ARF vs HH vs OP PT)    Lendon Ka PT  MWF [phone]  [phone]

## 2017-10-15 LAB — CBC
Absolute NRBC: 0 10*3/uL (ref 0.00–0.00)
Hematocrit: 46.3 % (ref 37.6–49.6)
Hgb: 13.9 g/dL (ref 12.5–17.1)
MCH: 27.1 pg (ref 25.1–33.5)
MCHC: 30 g/dL — ABNORMAL LOW (ref 31.5–35.8)
MCV: 90.4 fL (ref 78.0–96.0)
MPV: 9.8 fL (ref 8.9–12.5)
Nucleated RBC: 0 /100 WBC (ref 0.0–0.0)
Platelets: 258 10*3/uL (ref 142–346)
RBC: 5.12 10*6/uL (ref 4.20–5.90)
RDW: 13 % (ref 11–15)
WBC: 8.05 10*3/uL (ref 3.10–9.50)

## 2017-10-15 LAB — BASIC METABOLIC PANEL
Anion Gap: 10 (ref 5.0–15.0)
BUN: 12 mg/dL (ref 9.0–28.0)
CO2: 24 mEq/L (ref 22–29)
Calcium: 9.4 mg/dL (ref 8.5–10.5)
Chloride: 111 mEq/L (ref 100–111)
Creatinine: 0.8 mg/dL (ref 0.7–1.3)
Glucose: 201 mg/dL — ABNORMAL HIGH (ref 70–100)
Potassium: 4.3 mEq/L (ref 3.5–5.1)
Sodium: 145 mEq/L (ref 136–145)

## 2017-10-15 LAB — PLASMINOGEN ACTIVITY: Plasminogen Activity: 115 % (ref 65–176)

## 2017-10-15 LAB — SODIUM
Sodium: 147 mEq/L — ABNORMAL HIGH (ref 136–145)
Sodium: 145 mEq/L (ref 136–145)

## 2017-10-15 LAB — HEMOLYSIS INDEX: Hemolysis Index: 10 (ref 0–18)

## 2017-10-15 LAB — GFR: EGFR: >60

## 2017-10-15 LAB — GLUCOSE WHOLE BLOOD - POCT
Whole Blood Glucose POCT: 191 mg/dL — ABNORMAL HIGH (ref 70–100)
Whole Blood Glucose POCT: 269 mg/dL — ABNORMAL HIGH (ref 70–100)
Whole Blood Glucose POCT: 233 mg/dL — ABNORMAL HIGH (ref 70–100)
Whole Blood Glucose POCT: 249 mg/dL — ABNORMAL HIGH (ref 70–100)

## 2017-10-15 LAB — PROTEIN C ACTIVITY: Protein C, Activity: 193 % — ABNORMAL HIGH (ref 70–180)

## 2017-10-15 LAB — PROTEIN S ACTIVITY, RFLX PROT AG TOT/FRE (SOFT): Protein S Activity, Rflx Prot Ag Tot/Fre: 73 % (ref 70–150)

## 2017-10-15 MED ORDER — AMLODIPINE BESYLATE 5 MG PO TABS
5.00 mg | ORAL_TABLET | Freq: Every day | ORAL | Status: DC
Start: 2017-10-15 — End: 2017-10-18
  Administered 2017-10-15 – 2017-10-18 (×4): 5 mg via ORAL
  Filled 2017-10-15 (×4): qty 1

## 2017-10-15 NOTE — Plan of Care (Signed)
Problem: Safety  Goal: Patient will be free from infection during hospitalization  Outcome: Progressing   10/15/17 2126   Goal/Interventions addressed this shift   Free from Infection during hospitalization Assess and monitor for signs and symptoms of infection;Monitor lab/diagnostic results       Problem: Every Day - Stroke  Goal: Stable vital signs and fluid balance  Outcome: Progressing   10/15/17 2126   Goal/Interventions addressed this shift   Stable vital signs and fluid balance  Position patient for maximum circulation/cardiac output;Monitor and assess vitals every 4 hours or as ordered and hemodynamic parameters;Monitor intake and output. Notify LIP if urine output is < 30 mL/hour.;Encourage oral fluid intake;Apply telemetry monitor as ordered       Comments: Pt AOX4, No neuro deficits noted.  Continue to complain of HA pain when coughing. Currently patient is afebrile.  Noticed patient bp in the 170s, went to room and patient was eating with legs crossed.  Retook his Bp and new reading at 167/95.  Will continue to monitor in 30 mins.

## 2017-10-15 NOTE — Progress Notes (Signed)
ICU Daily Progress Note        Date Time: 10/15/17 10:58 AM  Patient Name: [name]  Attending Physician: Lattie Haw, MD  Room: [phone]   Admit Date: 10/12/2017  LOS: 3 days        Assessment:     .  Left cerebellar infarction  .  Petechial hemorrhage around the infarction  .  Hypertension  .  Diabetes mellitus  .  Nausea     Plan:     .  Left cerebellar infarction, clinically improving, continue aspirin and statin.  Discontinue 3% saline  .  Reviewed repeat CT than yesterday no significant change  .  Maintaining systolic blood pressure between 120 and 140, start low-dose Norvasc, also on lisinopril  .  On sliding scale follow blood sugar  .  Nausea has improved    Continue PT OT  Transfer to telemetry    Code Status: full code    Subjective:     Awake alert and comfortable    Review of Systems:     A comprehensive review of systems was: General ROS: negative for - chills,  fever or night sweats  Ophthalmic ROS: negative for - double vision, excessive tearing, itchy eyes or photophobia  ENT ROS: negative for - nasal congestion, sore throat, improved headache and vertigo   Respiratory ROS: negative for - cough, hemoptysis, orthopnea, pleuritic pain, shortness of breath, tachypnea or wheezing  Cardiovascular ROS: negative for - chest pain, edema, orthopnea, rapid heart rate  Gastrointestinal ROS: negative for - abdominal pain, blood in stools, constipation, diarrhea, heartburn, improving nausea vomiting   Musculoskeletal ROS: negative for -  muscle pain  Neurological ROS: negative for - confusion, dizziness, headaches, speech problems, visual changes or weakness  Dermatological ROS: negative for dry skin, pruritus and rash    Medications:      Scheduled Meds: PRN Meds:        amLODIPine 5 mg Oral Daily   aspirin 81 mg Oral Daily   atorvastatin 40 mg Oral QHS   enoxaparin 40 mg Subcutaneous Daily   insulin lispro 1-4 Units Subcutaneous QHS   insulin lispro 1-8 Units Subcutaneous TID AC    lisinopril 40 mg Oral Daily   sodium chloride (PF) 10 mL Intracatheter Daily       Continuous Infusions:     acetaminophen 650 mg Q4H PRN   butalbital-acetaminophen-caffeine 1 tablet Q6H PRN   hydrALAZINE 10 mg Q4H PRN   naloxone 0.2 mg PRN   ondansetron 4 mg Q4H PRN   Or     ondansetron 4 mg Q6H PRN   promethazine 12.5 mg Q6H PRN   senna-docusate 2 tablet Daily PRN         Physical Exam:     General Appearance:  alert, and in no distress  Neuro:  alert, oriented, normal speech, no focal findings or movement disorder noted  Neck:supple, no significant adenopathy  Lungs: clear to auscultation, no wheezes, rales or rhonchi, symmetric air entry  Cardiac: normal rate, regular rhythm, normal S1, S2, no murmurs, rubs, clicks or gallops  Abdomen:  soft, nontender, nondistended, no masses or organomegaly  Extremities: peripheral pulses normal, no pedal edema, no clubbing or cyanosis   Skin:   normal coloration and turgor, no rashes, no suspicious skin lesions noted      Data:   Invasive ICU Hemodynamics:    IBW/kg (Calculated) : 66.1    Patient Lines/Drains/Airways Status    Active PICC Line / CVC  Line / PIV Line / Drain / Airway / Intraosseous Line / Epidural Line / ART Line / Line / Wound / Pressure Ulcer / NG/OG Tube     Name:   Placement date:   Placement time:   Site:   Days:    PICC Double Lumen 10/13/17 Right Basilic  10/13/17    1700    Basilic    1    Peripheral IV 10/12/17 Right Antecubital  10/12/17    1601    Antecubital    2                VITAL SIGNS   Temp:  [97.6 F (36.4 C)-98.1 F (36.7 C)] 98.1 F (36.7 C)  Heart Rate:  [77-132] 77  Resp Rate:  [18] 18  BP: (129-167)/(66-96) 154/84  Blood Glucose:  Pulse ox:  Telemetry:       Intake/Output Summary (Last 24 hours) at 10/15/17 1058  Last data filed at 10/15/17 1000   Gross per 24 hour   Intake          2199.17 ml   Output             1000 ml   Net          1199.17 ml        Labs:     CBC w/Diff CMP     Recent Labs  Lab 10/15/17  0149 10/14/17  0202  10/13/17  0354 10/12/17  0728   WBC 8.05 7.22 11.64* 6.71   Hgb 13.9 14.0 14.4 14.8   Hematocrit 46.3 46.3 48.4 49.7*   Platelets 258 258 278 271   MCV 90.4 89.2 88.3 89.2   Neutrophils  --   --   --  52.5          PT/INR           Recent Labs  Lab 10/15/17  0919 10/15/17  0548 10/15/17  0149  10/14/17  0730 10/14/17  0202  10/12/17  0728   Sodium 145 147* 145 More results in Results Review 142 141 More results in Results Review 138   Potassium  --   --  4.3  --  4.5 4.5 More results in Results Review 4.8   Chloride  --   --  111  --  109 107 More results in Results Review 101   CO2  --   --  24  --  25 26 More results in Results Review 24   BUN  --   --  12.0  --  15.0 17.0 More results in Results Review 28.0   Creatinine  --   --  0.8  --  0.9 0.8 More results in Results Review 1.2   Glucose  --   --  201*  --  217* 201* More results in Results Review 361*   Calcium  --   --  9.4  --  9.0 9.3 More results in Results Review 10.0   Protein, Total  --   --   --   --   --   --   --  7.8   Albumin  --   --   --   --   --   --   --  3.9   AST (SGOT)  --   --   --   --   --   --   --  15   ALT  --   --   --   --   --   --   --  17   Alkaline Phosphatase  --   --   --   --   --   --   --  78   Bilirubin, Total  --   --   --   --   --   --   --  0.5   More results in Results Review = values in this interval not displayed.    Recent Labs  Lab 10/12/17  0728   Troponin I <0.01       @lababg @   Glucose POCT     Recent Labs  Lab 10/15/17  0149 10/14/17  0730 10/14/17  0202 10/13/17  2058 10/13/17  1640 10/12/17  0728   Glucose 201* 217* 201* 253* 240* 361*        Rads:   Radiological Imaging personally reviewed.    I have personally reviewed the patient's history and 24 hour interval events, along with vitals, labs, radiology images.     Lattie Haw, MD  Pulmonary and critical care  10/15/17

## 2017-10-15 NOTE — Progress Notes (Signed)
Performed bedside neuro assessment with RN Madelaine Etienne.

## 2017-10-15 NOTE — Plan of Care (Signed)
Problem: Day of Admission - Stroke  Goal: Core/Quality measure requirements - Admission  Outcome: Progressing   10/15/17 0534   Goal/Interventions addressed this shift   Core/Quality measure requirements - Admission Document NIH Stroke Scale on admission;Document nursing swallow/dysphagia screen on admission. If patient fails, keep patient NPO (follow your hospital protocol on swallowing screening).;VTE Prevention: Ensure anticoagulant(s) administered and/or anti-embolism stockings/devices documented as ordered;Ensure antithrombotic administered or contraindication documented by LIP;If diagnosis or history of Atrial Fib/Atrial Flutter, ensure oral anticoagulation is initiated or contraindication documented by LIP;Ensure lipid panel ordered;Begin stroke education on admission (must include Modifiable Risk Factors, Warning Signs and Symptoms of Stroke, Activation of Emergency Medical System and Follow-up Appointments) Ensure handout has been given and documented.;Ensure PT/OT and/or SLP ordered       Problem: Every Day - Stroke  Goal: Stable vital signs and fluid balance  Outcome: Progressing   10/15/17 0534   Goal/Interventions addressed this shift   Stable vital signs and fluid balance  Position patient for maximum circulation/cardiac output;Monitor and assess vitals every 4 hours or as ordered and hemodynamic parameters;Monitor intake and output. Notify LIP if urine output is < 30 mL/hour.;Encourage oral fluid intake;Apply telemetry monitor as ordered      10/15/17 0534   Goal/Interventions addressed this shift   Stable vital signs and fluid balance  Position patient for maximum circulation/cardiac output;Monitor and assess vitals every 4 hours or as ordered and hemodynamic parameters;Monitor intake and output. Notify LIP if urine output is < 30 mL/hour.;Encourage oral fluid intake;Apply telemetry monitor as ordered

## 2017-10-15 NOTE — Progress Notes (Signed)
[facility]  PROGRESS NOTE      Patient: [name]  Date: 10/15/2017   LOS: 3 Days  Admission Date: 10/12/2017   MRN: [id number]  Attending: Maple Mirza  Please contact me on the following Pager [phone]       ASSESSMENT/PLAN     [name] is a 52 y.o. male admitted with Acute nonintractable headache, unspecified headache type, CT scan and MRI was done, Pt found to have Lt cerebellar CVA, Neurology and neurosurgery was consulted and the patient was transferred to ICU for 3% NS.      Interval Summary:     Patient Active Hospital Problem List:    L cerebellar hemisphere infarction  Acute nonintractable headache, unspecified headache type  Midline shift  - CT scan reviewed  - MRI/ MRA reviewed  - Echo reviewed  - Pt will need TEE when stable-- will discuss with cardiology 7/21 for possible Monday TEE  -  hypercoag workup pending  - ASA and statin  - Neurology onboard- will eventually need ASA + Plavix, but defer for now  - Neurosurgery consult   - Nausea improved overnight, now transfer out to medical floor  - walking with PT, nurse    Gait instability with dizziness  - 2/2 above  - Treatment as above    History of hypertension (10/12/2017)  - Stable  - Monitor BP    Uncontrolled type 2 diabetes mellitus with hyperglycemia, with long-term current use of insulin (10/12/2017)  - Uncontrolled  - Correctional insulin  - Hb A1c 8.6, will start metformin/glipizide at time of discharge      Nutrition: Regular    DVT Prophylaxis:Lovenox        Code Status: Full    DISPO: TBD    Family Contact: fiance       SUBJECTIVE     [name] has improvement in nausea. No vomiting. Walked today and eating okay. No fever chills chest pain or sob.    MEDICATIONS     Current Facility-Administered Medications   Medication Dose Route Frequency   . amLODIPine  5 mg Oral Daily   . aspirin  81 mg Oral Daily   . atorvastatin  40 mg Oral QHS   . enoxaparin  40 mg Subcutaneous Daily   . insulin lispro  1-4 Units  Subcutaneous QHS   . insulin lispro  1-8 Units Subcutaneous TID AC   . lisinopril  40 mg Oral Daily       PHYSICAL EXAM     Vitals:    10/15/17 1200   BP: 148/70   Pulse: 85   Resp:    Temp:    SpO2: 97%       Temperature: Temp  Min: 97.6 F (36.4 C)  Max: 98.1 F (36.7 C)  Pulse: Pulse  Min: 77  Max: 108  Respiratory: Resp  Min: 18  Max: 19  Non-Invasive BP: BP  Min: 129/76  Max: 172/95  Pulse Oximetry SpO2  Min: 94 %  Max: 100 %    Intake and Output Summary (Last 24 hours) at Date Time    Intake/Output Summary (Last 24 hours) at 10/15/17 1525  Last data filed at 10/15/17 1000   Gross per 24 hour   Intake          1704.17 ml   Output              850 ml   Net  854.17 ml         GEN APPEARANCE: eyes closed, more comfortable NAD  HEENT: PERLA; EOMI; Conjunctiva Clear  NECK: Supple; No bruits  CVS: RRR, S1, S2; No M/G/R  LUNGS: CTAB; No Wheezes; No Rhonchi: No rales  ABD: Soft; No TTP; + Normoactive BS  EXT: No edema; Pulses 2+ and intact  Skin exam:  pink  NEURO: nystagmus. No ataxia on finger nose finger testing. Strength intact  CAP REFILL:  Normal  MENTAL STATUS:  Normal    Exam done by [name] Ar, MD on 10/15/17 at 2:27 PM      LABS       Recent Labs  Lab 10/15/17  0149 10/14/17  0202 10/13/17  0354   WBC 8.05 7.22 11.64*   RBC 5.12 5.19 5.48   Hgb 13.9 14.0 14.4   Hematocrit 46.3 46.3 48.4   MCV 90.4 89.2 88.3   Platelets 258 258 278         Recent Labs  Lab 10/15/17  0919 10/15/17  0548 10/15/17  0149 10/14/17  2229 10/14/17  1850  10/14/17  0730 10/14/17  0202 10/13/17  2058 10/13/17  1640   Sodium 145 147* 145 145 145 More results in Results Review 142 141 138 137   Potassium  --   --  4.3  --   --   --  4.5 4.5 4.6 4.4   Chloride  --   --  111  --   --   --  109 107 104 101   CO2  --   --  24  --   --   --  25 26 24 26    BUN  --   --  12.0  --   --   --  15.0 17.0 19.0 21.0   Creatinine  --   --  0.8  --   --   --  0.9 0.8 0.9 1.0   Glucose  --   --  201*  --   --   --  217* 201* 253* 240*    Calcium  --   --  9.4  --   --   --  9.0 9.3 9.4 9.7   More results in Results Review = values in this interval not displayed.      Recent Labs  Lab 10/12/17  0728   ALT 17   AST (SGOT) 15   Bilirubin, Total 0.5   Albumin 3.9   Alkaline Phosphatase 78         Recent Labs  Lab 10/12/17  0728   Troponin I <0.01             Microbiology Results     Procedure Component Value Units Date/Time    MRSA culture - Nares [[id number]] Collected:  10/13/17 1405    Specimen:  Body Fluid from Nares Updated:  10/13/17 1405    MRSA culture - Throat [[id number]] Collected:  10/13/17 1405    Specimen:  Body Fluid from Throat Updated:  10/13/17 1405           RADIOLOGY     Upon my review:    [name]  3:25 PM 10/15/2017

## 2017-10-15 NOTE — Plan of Care (Signed)
Problem: Neurological Deficit  Goal: Neurological status is stable or improving  Outcome: Progressing   10/15/17 1517   Goal/Interventions addressed this shift   Neurological status is stable or improving Monitor/assess/document neurological assessment (Stroke: every 4 hours);Observe for seizure activity and initiate seizure precautions if indicated       Problem: Every Day - Stroke  Goal: Core/Quality measure requirements - Daily  Outcome: Progressing   10/15/17 1517   Goal/Interventions addressed this shift   Core/Quality measure requirements - Daily  VTE Prevention: Ensure anticoagulant(s) administered and/or anti-embolism stockings/devices documented by end of day 2;Once lipid panel has resulted, check LDL. Contact provider for statin order if LDL > 70 (or ensure contraindication documented by LIP).;Ensure antithrombotic administered or contraindication documented by LIP by end of day 2;Continue stroke education (must include Modifiable Risk Factors, Warning Signs and Symptoms of Stroke, Activation of Emergency Medical System and Follow-up Appointments). Ensure handout has been given and documented.     Goal: Stable vital signs and fluid balance  Outcome: Progressing   10/15/17 1517   Goal/Interventions addressed this shift   Stable vital signs and fluid balance  Position patient for maximum circulation/cardiac output;Monitor and assess vitals every 4 hours or as ordered and hemodynamic parameters;Encourage oral fluid intake     Goal: Neurological status is stable or improving  Outcome: Progressing   10/15/17 1517   Goal/Interventions addressed this shift   Neurological status is stable or improving Monitor/assess/document neurological assessment (Stroke: every 4 hours);Observe for seizure activity and initiate seizure precautions if indicated       Comments: Pt's neuro status remains unchanged, positive nystagmus, neuro check q 4hr. Pt stated that he felt better today, dizziness and nausea better controlled. No  vomiting today. On prn zofran and phenergan. Able to keep some food down. Walked in the hallway with RN, mildly unsteady gait.   3% saline stopped @ 0841am, post sodium this morning was 145, MD notified.  SR, HR 80-90S, TACHY with movement, HR in 110s-120s. SBP remains at 140s-160s on this shift, MD aware, on prn and scheduled BP meds.   Adequate urine output.    PLAN: tranfer to Tele when bed available.

## 2017-10-15 NOTE — Progress Notes (Signed)
[facility] Consultation Progress Note    Date Time: 10/15/17 10:54 AM  Patient Name: [name],[name]  Outpatient Neurologist : N/a    CC: CVA    Assessment:   52 y/o male with a PMH significant for HTN and DM 2 who presented 7/17 with a 4 day hx of HA, blurry vision, and imbalance. Out of window for tPA or IR intervention. HCT showed L cerebellar hypodensity consistent with stroke     1. L cerebellar hemisphere infarction/L vert occlusion. 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 Mechanism of stroke possibly thrombotic given diffuse intracranial stenosis, though cannot exclude a cardio embolic event vs hypercoagulable state      -MRI brain L cerebellar hemisphere infarction with associated mass effect and petechial hemorrhage   -MRA neck L [place] artery occlusion; stenosis/thrombosis of proximal basilar artery;  Both PCAs are primarily supplied by the anterior circulation.    Patient Active Problem List   Diagnosis   . Acute nonintractable headache, unspecified headache type   . History of hypertension   . Uncontrolled type 2 diabetes mellitus with hyperglycemia, with long-term current use of insulin   . CVA (cerebral vascular accident)     Plan:   -Continue ASA 81 mg and statin for 2/2 stroke prevention. Will eventually need dual antiplatelets with ASA + Plavix given basilar artery stenosis, though would defer for now given infarction size and petechial hemorrhage.  -Would pursue a TEE to rule out a cardioembolic source given age<50  -Hypercoagulable panel in process  -PT/OT/SLP  -Will continue to follow and make recommendations as appropriate    Interval History/Subjective:   No acute neurological events documented or reported overnight.  Was able to ambulate with PT yesterday.   Had follow up head CT.   Hyperosmolar therapy discontinued by ICU.   Continues to endorse nausea/emesis; using Zofran and Phenergan with some temporary relief.  Headaches overall milder, with pain controlled with current  medication regimen.   No new complaints.     Medications:     Current Facility-Administered Medications   Medication Dose Route Frequency   . aspirin  81 mg Oral Daily   . atorvastatin  40 mg Oral QHS   . enoxaparin  40 mg Subcutaneous Daily   . insulin lispro  1-4 Units Subcutaneous QHS   . insulin lispro  1-8 Units Subcutaneous TID AC   . lisinopril  40 mg Oral Daily   . sodium chloride (PF)  10 mL Intracatheter Daily     Review of Systems:   Negative except for what is mentioned in the interval/subjective section       Physical Exam:   Temp:  [97.6 F (36.4 C)-98.1 F (36.7 C)] 98.1 F (36.7 C)  Heart Rate:  [77-132] 77  Resp Rate:  [18] 18  BP: (129-167)/(66-96) 154/84    Vital Signs:  Reviewed    General: The patient is well developed and well nourished.  No acute distress. Cooperative with the exam  Extremities: no pedal edema, extremities normal in color    Mental Status: The patient is awake, alert and oriented to person, place, and time.  Affect is blunted   Attention span and concentration appear normal.  Speech clear, fluent. NO dysarthria. Follows commands     Cranial nerves:   -CN II: Visual fields appear grossly full to bedside confrontation   -CN III, IV, VI: Pupils equal, round, and reactive to light; extraocular movements intact; + nystagmus with left and up  gaze; no ptosis.                            -CN VII: Face symmetric   -CN VIII: Hearing intact to conversational speech   -CN IX, X:Normal phonation   -CN XII: Tongue protrudes midline    Motor: Muscle tone normal without spasticity or flaccidity. No atrophy.  Strength is 5/5 throughout.    Sensory: Light touch intact.    Reflexes: Deferred.    Coordination: Minimal dysmetria on the left with FTN, intact on the R. No tremors.    Gait: Deferred 2/2 pt safety concerns.     Labs:     Results     Procedure Component Value Units Date/Time    Sodium [[id number]] Collected:  10/15/17 0919    Specimen:  Blood Updated:  10/15/17 0938     Sodium  145 mEq/L     Glucose Whole Blood - POCT [[id number]]  (Abnormal) Collected:  10/15/17 0812     Updated:  10/15/17 0815     POCT - Glucose Whole blood 191 (H) mg/dL     Sodium [[id number]]  (Abnormal) Collected:  10/15/17 0548    Specimen:  Blood Updated:  10/15/17 0616     Sodium 147 (H) mEq/L     Narrative:       Q3 hours    Basic Metabolic Panel [[id number]]  (Abnormal) Collected:  10/15/17 0149    Specimen:  Blood Updated:  10/15/17 0238     Sodium 145 mEq/L      Glucose 201 (H) mg/dL      BUN 62.9 mg/dL      Creatinine 0.8 mg/dL      Calcium 9.4 mg/dL      Potassium 4.3 mEq/L      Chloride 111 mEq/L      CO2 24 mEq/L      Anion Gap 10.0    Hemolysis index [[id number]] Collected:  10/15/17 0149     Updated:  10/15/17 0238     Hemolysis Index 10    GFR [[id number]] Collected:  10/15/17 0149     Updated:  10/15/17 0238     EGFR >60.0    Protein C Activity [[id number]]  (Abnormal) Collected:  10/13/17 1144    Specimen:  Blood Updated:  10/15/17 0227     Protein C, Activity 193 (H) %     CBC [[id number]]  (Abnormal) Collected:  10/15/17 0149    Specimen:  Blood from Blood Updated:  10/15/17 0221     WBC 8.05 x10 3/uL      Hgb 13.9 g/dL      Hematocrit 43.3 %      Platelets 258 x10 3/uL      RBC 5.12 x10 6/uL      MCV 90.4 fL      MCH 27.1 pg      MCHC 30.0 (L) g/dL      RDW 13 %      MPV 9.8 fL      Nucleated RBC 0.0 /100 WBC      Absolute NRBC 0.00 x10 3/uL     Protein S Activity, Rflx Prot Ag Tot/Fre [[id number]] Collected:  10/13/17 1144     Updated:  10/15/17 0203     Protein S Activity, Rflx Prot Ag Tot/Fre 73 %     Plasminogen activity [[id number]] Collected:  10/13/17 1144    Specimen:  Blood Updated:  10/15/17 0932  Plasminogen Activity 115 %     Sodium [[id number]] Collected:  10/14/17 2229    Specimen:  Blood Updated:  10/14/17 2249     Sodium 145 mEq/L     Glucose Whole Blood - POCT [[id number]]  (Abnormal) Collected:  10/14/17 2141     Updated:  10/14/17 2145     POCT - Glucose Whole blood 275 (H) mg/dL      Sodium [[id number]] Collected:  10/14/17 1850    Specimen:  Blood Updated:  10/14/17 1906     Sodium 145 mEq/L     ANA PANEL [[id number]] Collected:  10/13/17 1144     Updated:  10/14/17 1831     ANA Qualitative Negative     SSA 10     SSA Interp Negative     SSB 8     SSB Interp Negative     Sm 18     Sm Interp Negative     ANA RNP 22     RNP Interp Negative     Scleroderma SCL-70 17     Scl-70 Interp Negative     Jo-1 20     Jo-1 Interp Negative     Anti-DNA (DS) Ab Qn 39     dsDNA Interp Negative     Centromere 8     Centromere Interp Negative     Histone 5     Histone Interp Negative    Glucose Whole Blood - POCT [[id number]]  (Abnormal) Collected:  10/14/17 1615     Updated:  10/14/17 1618     POCT - Glucose Whole blood 215 (H) mg/dL     MRSA culture - Nares [[id number]] Collected:  10/13/17 1405    Specimen:  Body Fluid from Nares Updated:  10/14/17 1537     Culture MRSA Surveillance Negative for Methicillin Resistant Staph aureus    MRSA culture - Throat [[id number]] Collected:  10/13/17 1405    Specimen:  Body Fluid from Throat Updated:  10/14/17 1537     Culture MRSA Surveillance Negative for Methicillin Resistant Staph aureus    Sodium [[id number]] Collected:  10/14/17 1505    Specimen:  Blood Updated:  10/14/17 1523     Sodium 144 mEq/L     Glucose Whole Blood - POCT [[id number]]  (Abnormal) Collected:  10/14/17 1132     Updated:  10/14/17 1143     POCT - Glucose Whole blood 234 (H) mg/dL     Sodium [[id number]] Collected:  10/14/17 1114    Specimen:  Blood Updated:  10/14/17 1137     Sodium 144 mEq/L     Factor VIII activity [[id number]]  (Abnormal) Collected:  10/13/17 1144    Specimen:  Blood Updated:  10/14/17 1126     Factor VIII Activity 446 (H) %     Factor VII activity [[id number]] Collected:  10/13/17 1144    Specimen:  Blood Updated:  10/14/17 1126     Factor VII Activity 130 %           Rads:   Ct Head Wo Contrast    Result Date: 10/14/2017   Evolving left cerebellar stroke with petechial hemorrhage,  similar to the prior MRI of the brain. Joselyn Glassman, MD 10/14/2017 3:08 PM    Ct Head Without Contrast    Result Date: 10/12/2017   Extensive hypodensity through the left cerebellar hemisphere most likely age-indeterminate ischemic change. Note: Note that CT scanning at this site  utilizes multiple dose reduction techniques including automatic  exposure control, adjustment of the MAA and/or KVP according to patient's size and use of iterative reconstruction technique Laurena Slimmer, MD 10/12/2017 8:21 AM    Mra Head (intracranial) Without Contrast    Result Date: 10/12/2017  1. MRA Neck Vessels: No significant carotid stenosis. Absent antegrade flow in the left vertebral artery which appears occluded proximally. 2. 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 The anterior circulation is normal. 3. The findings were discussed with the attending neurologist at the time of interpretation. Heron Nay, MD 10/12/2017 9:44 PM    Mra Neck With / Without Contrast    Result Date: 10/12/2017  1. MRA Neck Vessels: No significant carotid stenosis. Absent antegrade flow in the left vertebral artery which appears occluded proximally. 2. 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 The anterior circulation is normal. 3. The findings were discussed with the attending neurologist at the time of interpretation. Heron Nay, MD 10/12/2017 9:44 PM    Mri Brain With / Without Contrast    Result Date: 10/12/2017   Evolving left cerebellar hemisphere infarction with mild mass effect and superficial petechial type hemorrhage. The findings were discussed with the attending neurologist following interpretation. Heron Nay, MD 10/12/2017 9:44 PM    Chest Ap Portable    Result Date: 10/12/2017   No definite acute evaluate accounting for degree of an sprain Laurena Slimmer, MD 10/12/2017 8:12 AM    Imaging above was personally reviewed by me  and I agree with radiologist's interpretations as listed.     De Hollingshead, MD  [facility]  Board Certified in Adult Neurology by the American Board of Psychiatry and Neurology (ABPN)  Board Certified in Headache Medicine by the [employer] ([employer])  10/15/2017

## 2017-10-16 LAB — BASIC METABOLIC PANEL
Anion Gap: 11 (ref 5.0–15.0)
BUN: 11 mg/dL (ref 9.0–28.0)
CO2: 23 mEq/L (ref 22–29)
Calcium: 9.2 mg/dL (ref 8.5–10.5)
Chloride: 107 mEq/L (ref 100–111)
Creatinine: 0.8 mg/dL (ref 0.7–1.3)
Glucose: 206 mg/dL — ABNORMAL HIGH (ref 70–100)
Potassium: 3.8 mEq/L (ref 3.5–5.1)
Sodium: 141 mEq/L (ref 136–145)

## 2017-10-16 LAB — GLUCOSE WHOLE BLOOD - POCT
Whole Blood Glucose POCT: 246 mg/dL — ABNORMAL HIGH (ref 70–100)
Whole Blood Glucose POCT: 212 mg/dL — ABNORMAL HIGH (ref 70–100)
Whole Blood Glucose POCT: 338 mg/dL — ABNORMAL HIGH (ref 70–100)
Whole Blood Glucose POCT: 195 mg/dL — ABNORMAL HIGH (ref 70–100)

## 2017-10-16 LAB — ANTI-CARDIOLIPIN ANTIBODY PANEL 1 (SOFT)
Cardiolipin IgG Antibody: <14 (ref <=14)
Cardiolipin IgM Antibody: <12 (ref <=12)

## 2017-10-16 LAB — CBC
Absolute NRBC: 0 10*3/uL (ref 0.00–0.00)
Hematocrit: 47.9 % (ref 37.6–49.6)
Hgb: 14.1 g/dL (ref 12.5–17.1)
MCH: 26.4 pg (ref 25.1–33.5)
MCHC: 29.4 g/dL — ABNORMAL LOW (ref 31.5–35.8)
MCV: 89.5 fL (ref 78.0–96.0)
MPV: 9.1 fL (ref 8.9–12.5)
Nucleated RBC: 0 /100 WBC (ref 0.0–0.0)
Platelets: 250 10*3/uL (ref 142–346)
RBC: 5.35 10*6/uL (ref 4.20–5.90)
RDW: 13 % (ref 11–15)
WBC: 7.91 10*3/uL (ref 3.10–9.50)

## 2017-10-16 LAB — METHYLENETETRAHYDROFOLATE REDUCTASE

## 2017-10-16 LAB — PROTHROMBIN GENE MUTATION

## 2017-10-16 LAB — FACTOR V LEIDEN

## 2017-10-16 LAB — HEMOLYSIS INDEX: Hemolysis Index: 9 (ref 0–18)

## 2017-10-16 LAB — GFR: EGFR: >60

## 2017-10-16 MED ORDER — COLCHICINE 0.6 MG PO TABS
0.60 mg | ORAL_TABLET | Freq: Every day | ORAL | Status: DC
Start: 2017-10-17 — End: 2017-10-17
  Administered 2017-10-17: 09:00:00 0.6 mg via ORAL
  Filled 2017-10-16: qty 1

## 2017-10-16 MED ORDER — COLCHICINE 0.6 MG PO TABS
1.20 mg | ORAL_TABLET | Freq: Once | ORAL | Status: AC
Start: 2017-10-16 — End: 2017-10-16
  Administered 2017-10-16: 17:00:00 1.2 mg via ORAL
  Filled 2017-10-16: qty 2

## 2017-10-16 MED ORDER — MORPHINE SULFATE 2 MG/ML IJ/IV SOLN (WRAP)
2.0000 mg | Freq: Once | Status: AC
Start: 2017-10-16 — End: 2017-10-16
  Administered 2017-10-16: 21:00:00 2 mg via INTRAVENOUS
  Filled 2017-10-16: qty 1

## 2017-10-16 NOTE — Discharge Instr - AVS First Page (Addendum)
Reason for your Hospital Admission:  Cerebellar stroke      Instructions for after your discharge:  You had a cerebellar stroke. You were started on aspirin and atorvastatin. Our neurologist suggests that you should also start plavix in a few weeks because you have intracranial stenoses (blockages in your brain making you more susceptible to stroke). We did not start it here because there was a tiny amount of bleeding after the stroke (petechial hemorrhage).   We have done a hypercoagulable workup and results are pending. You need to follow up on these.   You will need TEE outpatient  Please see a neurologist within 1-2 weeks.   Control your diabetes and do not smoke   Return to the hospital for worsening headache, dizziness, bleeding or any other worrisome signs and symptoms.

## 2017-10-16 NOTE — Progress Notes (Signed)
10/15/17 2100   Vitals   Level of Consciousness Responds to voice;Responds to pain   Heart Rate (!) 101   Pulse (SpO2) 100   Heart Rate Source Monitor   BP (!) 171/92   BP Location Left arm   BP Method Automatic   MAP (mmHg) (!) 124   Patient Position Lying   Cardiac Rhythm Normal Sinus Rhythm   Ectopy PVC   Ectopy Frequency Occasional   Oxygen Therapy   SpO2 96 %   O2 Device None (Room air)   Pt was seen eating with legs crossed.  Will monitor patience BP

## 2017-10-16 NOTE — Progress Notes (Signed)
[facility] Consultation Progress Note    Date Time: 10/16/17 10:23 AM  Patient Name: [name],[name]  Outpatient Neurologist : N/a    CC: CVA    Assessment:   52 y/o male with a PMH significant for HTN and DM 2 who presented 7/17 with a 4 day hx of HA, blurry vision, and imbalance. Out of window for tPA or IR intervention. HCT showed L cerebellar hypodensity consistent with stroke     1. L cerebellar hemisphere infarction/L vert occlusion. 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 Mechanism of stroke possibly thrombotic given diffuse intracranial stenosis, though cannot exclude a cardio embolic event vs hypercoagulable state      -MRI brain L cerebellar hemisphere infarction with associated mass effect and petechial hemorrhage   -MRA neck L [place] artery occlusion; stenosis/thrombosis of proximal basilar artery;  Both PCAs are primarily supplied by the anterior circulation.    Patient Active Problem List   Diagnosis   . Acute nonintractable headache, unspecified headache type   . History of hypertension   . Uncontrolled type 2 diabetes mellitus with hyperglycemia, with long-term current use of insulin   . CVA (cerebral vascular accident)     Plan:   -Continue ASA 81 mg and statin for 2/2 stroke prevention. Will eventually need dual antiplatelets with ASA + Plavix given basilar artery stenosis, though would defer for now given infarction size and petechial hemorrhage.  -BP control  -Would pursue a TEE to rule out a cardioembolic source given age<50-->this can be done after he returns to [facility] as no significant abnormalities were noted on TTE  -Hypercoagulable panel pending  -Discussed need to establish with a neurologist upon return to [place] with patient    Interval History/Subjective:   No acute neurological events documented or reported overnight.  Only noting headache with cough or sneeze.   Nausea improved; able to tolerate meals.   Noting some gout pain in his big toe.  No new complaints.      Medications:     Current Facility-Administered Medications   Medication Dose Route Frequency   . amLODIPine  5 mg Oral Daily   . aspirin  81 mg Oral Daily   . atorvastatin  40 mg Oral QHS   . enoxaparin  40 mg Subcutaneous Daily   . insulin lispro  1-4 Units Subcutaneous QHS   . insulin lispro  1-8 Units Subcutaneous TID AC   . lisinopril  40 mg Oral Daily     Review of Systems:   Negative except for what is mentioned in the interval/subjective section       Physical Exam:   Temp:  [97.6 F (36.4 C)-98.4 F (36.9 C)] 98.4 F (36.9 C)  Heart Rate:  [73-101] 73  Resp Rate:  [16-19] 16  BP: (107-172)/(61-99) 165/99    Vital Signs:  Reviewed    General: The patient is well developed and well nourished.  No acute distress. Cooperative with the exam  Extremities: no pedal edema, extremities normal in color    Mental Status: The patient is awake, alert and oriented to person, place, and time.  Affect is blunted   Attention span and concentration appear normal.  Speech clear, fluent. NO dysarthria. Follows commands     Cranial nerves:   -CN II: Visual fields appear grossly full to bedside confrontation   -CN III, IV, VI: Pupils equal, round, and reactive to light; extraocular movements intact; + nystagmus with left and up gaze; no ptosis.                            -  CN VII: Face symmetric   -CN VIII: Hearing intact to conversational speech   -CN IX, X:Normal phonation   -CN XII: Tongue protrudes midline    Motor: Muscle tone normal without spasticity or flaccidity. No atrophy.  Strength is 5/5 throughout.    Sensory: Light touch intact.    Reflexes: Deferred.    Coordination: Minimal dysmetria on the left with FTN, intact on the R. No tremors.    Gait: Deferred 2/2 pt safety concerns.     Labs:     Results     Procedure Component Value Units Date/Time    Glucose Whole Blood - POCT [[id number]]  (Abnormal) Collected:  10/16/17 0800     Updated:  10/16/17 0805     POCT - Glucose Whole blood 246 (H) mg/dL      Anti-Cardiolipin Antibody Panel 1 [[id number]] Collected:  10/13/17 1144     Updated:  10/16/17 0556     Cardiolipin AB, IgG <14     Cardiolipin AB, IgM <12    GFR [[id number]] Collected:  10/16/17 0426     Updated:  10/16/17 0532     EGFR >60.0    Basic Metabolic Panel [[id number]]  (Abnormal) Collected:  10/16/17 0426    Specimen:  Blood Updated:  10/16/17 0532     Glucose 206 (H) mg/dL      BUN 84.1 mg/dL      Creatinine 0.8 mg/dL      Calcium 9.2 mg/dL      Sodium 324 mEq/L      Potassium 3.8 mEq/L      Chloride 107 mEq/L      CO2 23 mEq/L      Anion Gap 11.0    Hemolysis index [[id number]] Collected:  10/16/17 0426     Updated:  10/16/17 0532     Hemolysis Index 9    CBC [[id number]]  (Abnormal) Collected:  10/16/17 0426    Specimen:  Blood from Blood Updated:  10/16/17 0455     WBC 7.91 x10 3/uL      Hgb 14.1 g/dL      Hematocrit 25.9 %      Platelets 250 x10 3/uL      RBC 5.35 x10 6/uL      MCV 89.5 fL      MCH 26.4 pg      MCHC 29.4 (L) g/dL      RDW 13 %      MPV 9.1 fL      Nucleated RBC 0.0 /100 WBC      Absolute NRBC 0.00 x10 3/uL     Glucose Whole Blood - POCT [[id number]]  (Abnormal) Collected:  10/15/17 2311     Updated:  10/15/17 2338     POCT - Glucose Whole blood 249 (H) mg/dL     Glucose Whole Blood - POCT [[id number]]  (Abnormal) Collected:  10/15/17 1540     Updated:  10/15/17 1659     POCT - Glucose Whole blood 233 (H) mg/dL     Glucose Whole Blood - POCT [[id number]]  (Abnormal) Collected:  10/15/17 1144     Updated:  10/15/17 1147     POCT - Glucose Whole blood 269 (H) mg/dL           Rads:   Ct Head Wo Contrast    Result Date: 10/14/2017   Evolving left cerebellar stroke with petechial hemorrhage, similar to the prior MRI of the brain. Joselyn Glassman, MD 10/14/2017 3:08 PM  Ct Head Without Contrast    Result Date: 10/12/2017   Extensive hypodensity through the left cerebellar hemisphere most likely age-indeterminate ischemic change. Note: Note that CT scanning at this site  utilizes multiple dose  reduction techniques including automatic exposure control, adjustment of the MAA and/or KVP according to patient's size and use of iterative reconstruction technique Laurena Slimmer, MD 10/12/2017 8:21 AM    Mra Head (intracranial) Without Contrast    Result Date: 10/12/2017  1. MRA Neck Vessels: No significant carotid stenosis. Absent antegrade flow in the left vertebral artery which appears occluded proximally. 2. 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 The anterior circulation is normal. 3. The findings were discussed with the attending neurologist at the time of interpretation. Heron Nay, MD 10/12/2017 9:44 PM    Mra Neck With / Without Contrast    Result Date: 10/12/2017  1. MRA Neck Vessels: No significant carotid stenosis. Absent antegrade flow in the left vertebral artery which appears occluded proximally. 2. 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 The anterior circulation is normal. 3. The findings were discussed with the attending neurologist at the time of interpretation. Heron Nay, MD 10/12/2017 9:44 PM    Mri Brain With / Without Contrast    Result Date: 10/12/2017   Evolving left cerebellar hemisphere infarction with mild mass effect and superficial petechial type hemorrhage. The findings were discussed with the attending neurologist following interpretation. Heron Nay, MD 10/12/2017 9:44 PM    Chest Ap Portable    Result Date: 10/12/2017   No definite acute evaluate accounting for degree of an sprain Laurena Slimmer, MD 10/12/2017 8:12 AM    Imaging above was personally reviewed by me and I agree with radiologist's interpretations as listed.     De Hollingshead, MD  [facility]  Board Certified in Adult Neurology by the American Board of Psychiatry and Neurology (ABPN)  Board Certified in Headache Medicine by the [employer]  (UCNS)  10/16/2017

## 2017-10-16 NOTE — Plan of Care (Signed)
Problem: Safety  Goal: Patient will be free from infection during hospitalization  Outcome: Progressing   10/16/17 2201   Goal/Interventions addressed this shift   Free from Infection during hospitalization Assess and monitor for signs and symptoms of infection;Monitor lab/diagnostic results;Monitor all insertion sites (i.e. indwelling lines, tubes, urinary catheters, and drains);Encourage patient and family to use good hand hygiene technique       Problem: Neurological Deficit  Goal: Neurological status is stable or improving  Outcome: Progressing   10/16/17 2201   Goal/Interventions addressed this shift   Neurological status is stable or improving Monitor/assess/document neurological assessment (Stroke: every 4 hours);Monitor/assess NIH Stroke Scale;Re-assess NIH Stroke Scale for any change in status;Observe for seizure activity and initiate seizure precautions if indicated;Perform CAM Assessment       Problem: Every Day - Stroke  Goal: Core/Quality measure requirements - Daily  Outcome: Progressing   10/16/17 2201   Goal/Interventions addressed this shift   Core/Quality measure requirements - Daily  VTE Prevention: Ensure anticoagulant(s) administered and/or anti-embolism stockings/devices documented by end of day 2;Once lipid panel has resulted, check LDL. Contact provider for statin order if LDL > 70 (or ensure contraindication documented by LIP).;Ensure antithrombotic administered or contraindication documented by LIP by end of day 2;Continue stroke education (must include Modifiable Risk Factors, Warning Signs and Symptoms of Stroke, Activation of Emergency Medical System and Follow-up Appointments). Ensure handout has been given and documented.     Goal: Stable vital signs and fluid balance  Outcome: Progressing   10/16/17 2201   Goal/Interventions addressed this shift   Stable vital signs and fluid balance  Position patient for maximum circulation/cardiac output;Monitor and assess vitals every 4 hours or as  ordered and hemodynamic parameters;Monitor intake and output. Notify LIP if urine output is < 30 mL/hour.;Encourage oral fluid intake;Apply telemetry monitor as ordered     Goal: Neurological status is stable or improving  Outcome: Progressing   10/16/17 2201   Goal/Interventions addressed this shift   Neurological status is stable or improving Monitor/assess/document neurological assessment (Stroke: every 4 hours);Monitor/assess NIH Stroke Scale;Re-assess NIH Stroke Scale for any change in status;Observe for seizure activity and initiate seizure precautions if indicated;Perform CAM Assessment

## 2017-10-16 NOTE — Plan of Care (Signed)
Problem: Every Day - Stroke  Goal: Core/Quality measure requirements - Daily  Outcome: Progressing   10/16/17 1858   Goal/Interventions addressed this shift   Core/Quality measure requirements - Daily  VTE Prevention: Ensure anticoagulant(s) administered and/or anti-embolism stockings/devices documented by end of day 2;Ensure antithrombotic administered or contraindication documented by LIP by end of day 2;Once lipid panel has resulted, check LDL. Contact provider for statin order if LDL > 70 (or ensure contraindication documented by LIP).;Continue stroke education (must include Modifiable Risk Factors, Warning Signs and Symptoms of Stroke, Activation of Emergency Medical System and Follow-up Appointments). Ensure handout has been given and documented.     PT: d/c recommendation Acute Rehab. Pt is from [place] and would like to return once discharged. Case management assistance will be needed for placement options.     TEE planned for tomorrow. Once completed pt will most like d/c. PICC line remains in place until vegetation can be ruled out.     Pt c/o R Big toe pain today, started on medication for gout, per pt hx feels the same.    Pt showered and ambulated with assist x1, gait remains slightly unsteady, only neuro deficits are nyastagmus when looking left, c/o mild HA "3/10"

## 2017-10-16 NOTE — Progress Notes (Signed)
PULM. CCM PROGRESS NOTE    Date Time: 10/16/17 7:43 AM  Patient Name: [name]      Assessment:     .  Left cerebellar infarction  .  Hypertension  .  Petechial hemorrhage around infarction  .  Diabetes mellitus    Plan:     .  Left cerebellar infarction, clinically improved, on aspirin and statin, possible TEE as an outpatient  .  Hypertension, controlled on lisinopril and amlodipine  .  Diabetes mellitus, on sliding scale  .  Stable neurological status  .  Nausea has improved    Possible discharge to home today    Subjective:   Patient is a 52 y.o. male who is awake, alert, and comfortable.  Denies dizziness and nausea at this time.    Medications:     Current Facility-Administered Medications   Medication Dose Route Frequency   . amLODIPine  5 mg Oral Daily   . aspirin  81 mg Oral Daily   . atorvastatin  40 mg Oral QHS   . enoxaparin  40 mg Subcutaneous Daily   . insulin lispro  1-4 Units Subcutaneous QHS   . insulin lispro  1-8 Units Subcutaneous TID AC   . lisinopril  40 mg Oral Daily       Review of Systems:     General ROS: negative for - chills, fever or night sweats  Ophthalmic ROS: negative for - double vision, excessive tearing, itchy eyes or photophobia  ENT ROS: negative for - headaches, nasal congestion, sore throat or vertigo  Respiratory ROS: negative for - cough, hemoptysis, orthopnea, pleuritic pain, shortness of breath, tachypnea or wheezing  Cardiovascular ROS: negative for - chest pain, edema, orthopnea, rapid heart rate  Gastrointestinal ROS: negative for - abdominal pain, blood in stools, constipation, diarrhea, heartburn or nausea/vomiting  Musculoskeletal ROS: negative for - muscle pain  Neurological ROS: negative for - confusion, dizziness, headaches, speech problems, visual changes or weakness  Dermatological ROS: negative for dry skin, pruritus and rash    Physical Exam:   BP 147/74   Pulse 73   Temp 98.4 F (36.9 C) (Oral)   Resp 16   Ht 1.702 m (5\' 7" )   Wt 88 kg (194 lb  0.1 oz)   SpO2 96%   BMI 30.39 kg/m     Intake and Output Summary (Last 24 hours) at Date Time    Intake/Output Summary (Last 24 hours) at 10/16/17 0743  Last data filed at 10/16/17 0100   Gross per 24 hour   Intake          1584.17 ml   Output              300 ml   Net          1284.17 ml       General appearance - alert,  and in no distress  Mental status - alert, oriented to person, place, and time  Eyes - pupils equal and reactive, extraocular eye movements intact, focal disorder appreciated  Ears - no external ear lesions seen  Nose - no nasal discharge   Mouth - clear oral mucosa, moist   Neck - supple, no significant adenopathy  Chest - clear to auscultation, no wheezes, rales or rhonchi, symmetric air entry  Heart - normal rate, regular rhythm, normal S1, S2, no murmurs, rubs, clicks or gallops  Abdomen - soft, nontender, nondistended, no masses or organomegaly  Neurological - alert, oriented, normal speech, no  focal findings or movement disorder noted  Musculoskeletal - no joint swelling or tenderness  Extremities -  no pedal edema, no clubbing or cyanosis  Skin - normal coloration, no rashes    Labs:     Recent Labs  Lab 10/16/17  0426 10/15/17  0149 10/14/17  0202  10/12/17  0728   WBC 7.91 8.05 7.22 More results in Results Review 6.71   Hgb 14.1 13.9 14.0 More results in Results Review 14.8   Hematocrit 47.9 46.3 46.3 More results in Results Review 49.7*   Platelets 250 258 258 More results in Results Review 271   MCV 89.5 90.4 89.2 More results in Results Review 89.2   Neutrophils  --   --   --   --  52.5   More results in Results Review = values in this interval not displayed.    Recent Labs  Lab 10/16/17  0426 10/15/17  0919 10/15/17  0548 10/15/17  0149  10/14/17  0730  10/12/17  0728   Sodium 141 145 147* 145 More results in Results Review 142 More results in Results Review 138   Potassium 3.8  --   --  4.3  --  4.5 More results in Results Review 4.8   Chloride 107  --   --  111  --  109 More  results in Results Review 101   CO2 23  --   --  24  --  25 More results in Results Review 24   BUN 11.0  --   --  12.0  --  15.0 More results in Results Review 28.0   Creatinine 0.8  --   --  0.8  --  0.9 More results in Results Review 1.2   Glucose 206*  --   --  201*  --  217* More results in Results Review 361*   Calcium 9.2  --   --  9.4  --  9.0 More results in Results Review 10.0   Protein, Total  --   --   --   --   --   --   --  7.8   Albumin  --   --   --   --   --   --   --  3.9   AST (SGOT)  --   --   --   --   --   --   --  15   ALT  --   --   --   --   --   --   --  17   Alkaline Phosphatase  --   --   --   --   --   --   --  78   Bilirubin, Total  --   --   --   --   --   --   --  0.5   More results in Results Review = values in this interval not displayed.  Glucose:    Recent Labs  Lab 10/16/17  0426 10/15/17  0149 10/14/17  0730 10/14/17  0202 10/13/17  2058 10/13/17  1640 10/12/17  0728   Glucose 206* 201* 217* 201* 253* 240* 361*           Rads:   Radiological Procedure reviewed.  Radiology Results (24 Hour)     ** No results found for the last 24 hours. **            Lattie Haw, MD  Pulmonary  and critical care  10/16/17

## 2017-10-16 NOTE — Plan of Care (Signed)
Problem: Physical Therapy  Goal: By discharge, patient will perform mobility at the patient's highest functional potential. See PT evaluation/note for goals.  Outcome: Progressing  Discharge Recommendation: Acute Rehab (if not AR will need HHPT with 24/hr supervision )  DME Recommended for Discharge: Front wheel walker    Is an Occupational Therapy Evaluation Indicated at this time? This patient is already on OT caseload.     Treatment/Interventions: Exercise, Gait training, Stair training, Neuromuscular re-education, Patient/family training, Equipment eval/education  PT Frequency: 4-5x/wk     Next Visit Recommendation: PT - Next Visit Recommendation: 10/17/17     PMP Activity: Step 7 - Walks out of Room  Distance Walked (ft) (Step 6,7): 80 Feet  (Please See Therapy Evaluation for device and assistance level needed)    Goals:   Goals  Goal Formulation: With patient  Time for Goal Acheivement: By time of discharge  Goals: Select goal  Pt Will Go Supine To Sit: modified independent, to maximize functional mobility and independence, Not met  Pt Will Perform Sit To Supine: modified independent, to maximize functional mobility and independence, Not met  Pt Will Perform Sit to Stand: modified independent, to maximize functional mobility and independence, Not met  Pt Will Transfer Bed/Chair: with rolling walker, modified independent, to maximize functional mobility and independence, Not met  Pt Will Ambulate: 151-200 feet, with stand by assist, Not met (with least restrictive device)  Pt Will Go Up / Down Stairs: 3-5 stairs, With rail, with contact guard assist, Not met  Pt Will Perform Home Exer Program: with stand by assist, Not met

## 2017-10-16 NOTE — Plan of Care (Incomplete)
Problem: Every Day - Stroke  Goal: Core/Quality measure requirements - Daily  Outcome: Progressing   10/16/17 1554   Goal/Interventions addressed this shift   Core/Quality measure requirements - Daily  VTE Prevention: Ensure anticoagulant(s) administered and/or anti-embolism stockings/devices documented by end of day 2;Ensure antithrombotic administered or contraindication documented by LIP by end of day 2;Once lipid panel has resulted, check LDL. Contact provider for statin order if LDL > 70 (or ensure contraindication documented by LIP).;Continue stroke education (must include Modifiable Risk Factors, Warning Signs and Symptoms of Stroke, Activation of Emergency Medical System and Follow-up Appointments). Ensure handout has been given and documented.       PT/OT: D/C recommendations

## 2017-10-16 NOTE — Progress Notes (Signed)
[facility]  PROGRESS NOTE      Patient: [name]  Date: 10/16/2017   LOS: 4 Days  Admission Date: 10/12/2017   MRN: [id number]  Attending: Maple Mirza  Please contact me on the following Pager [phone]       ASSESSMENT/PLAN     [name] is a 52 y.o. 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  Patient was started on aspirin and statin. he needs to start plavix in a few weeks for intracranial stenoses (not started here due to petechial hemorrhages). Patient has now improved significantly, and will get TEE before discharge. He wants to return to [facility] and plans to get neurologist in [place].     Interval Summary:     Patient Active Hospital Problem List:    L cerebellar hemisphere infarction  Acute nonintractable headache, unspecified headache type  Midline shift  - CT scan reviewed  - MRI/ MRA reviewed  - Echo reviewed- no vegetation/thrombus seen  - Needs TEE- requested with [facility] cardiology 7/21 for possible Monday TEE  -  hypercoag workup pending  - ASA and statin  - Neurology onboard- will eventually need ASA + Plavix for diffuse intracranial plaques  - Neurosurgery consulted  - walking with PT, nurse    Gait instability with dizziness  - 2/2 above  - Treatment as above    History of hypertension (10/12/2017)  - Stable  - Monitor BP    Uncontrolled type 2 diabetes mellitus with hyperglycemia, with long-term current use of insulin (10/12/2017)  - Uncontrolled  - Correctional insulin  - Hb A1c 8.6, will resume home meds at time of discharge      Nutrition: Regular    DVT Prophylaxis:Lovenox        Code Status: Full    DISPO: TBD    Family Contact: fiance       SUBJECTIVE     [name] feeling better. Less headache and nausea. Ambulates but lists to one side. No fever chills sob cough or chest  pain.    MEDICATIONS     Current Facility-Administered Medications   Medication Dose Route Frequency   . amLODIPine  5 mg Oral Daily   . aspirin  81 mg Oral Daily   . atorvastatin  40 mg Oral QHS   . enoxaparin  40 mg Subcutaneous Daily   . insulin lispro  1-4 Units Subcutaneous QHS   . insulin lispro  1-8 Units Subcutaneous TID AC   . lisinopril  40 mg Oral Daily       PHYSICAL EXAM     Vitals:    10/16/17 1400   BP:    Pulse: 96   Resp:    Temp:    SpO2:        Temperature: Temp  Min: 98.1 F (36.7 C)  Max: 98.4 F (36.9 C)  Pulse: Pulse  Min: 73  Max: 101  Respiratory: Resp  Min: 16  Max: 17  Non-Invasive BP: BP  Min: 107/62  Max: 171/92  Pulse Oximetry SpO2  Min: 94 %  Max: 99 %    Intake and Output Summary (Last 24 hours) at Date Time    Intake/Output Summary (Last 24 hours) at 10/16/17 1442  Last data filed at 10/16/17 1400   Gross per 24 hour  Intake             1510 ml   Output              300 ml   Net             1210 ml         GEN APPEARANCE: sitting up to get to bathroom more comfortable NAD  HEENT: PERLA; EOMI; Conjunctiva Clear  NECK: Supple; No bruits  CVS: RRR, S1, S2; No M/G/R  LUNGS: CTAB; No Wheezes; No Rhonchi: No rales  ABD: Soft; No TTP; + Normoactive BS  EXT: No edema; Pulses 2+ and intact  Skin exam:  pink  NEURO: nystagmus. No ataxia on finger nose finger testing. Strength intact  CAP REFILL:  Normal  MENTAL STATUS:  Normal    Exam done by Scarlette Ar, MD on 10/16/17 at 2:27 PM      LABS       Recent Labs  Lab 10/16/17  0426 10/15/17  0149 10/14/17  0202   WBC 7.91 8.05 7.22   RBC 5.35 5.12 5.19   Hgb 14.1 13.9 14.0   Hematocrit 47.9 46.3 46.3   MCV 89.5 90.4 89.2   Platelets 250 258 258         Recent Labs  Lab 10/16/17  0426 10/15/17  0919 10/15/17  0548 10/15/17  0149 10/14/17  2229  10/14/17  0730 10/14/17  0202 10/13/17  2058   Sodium 141 145 147* 145 145 More results in Results Review 142 141 138   Potassium 3.8  --   --  4.3  --   --  4.5 4.5 4.6   Chloride 107  --   --  111   --   --  109 107 104   CO2 23  --   --  24  --   --  25 26 24    BUN 11.0  --   --  12.0  --   --  15.0 17.0 19.0   Creatinine 0.8  --   --  0.8  --   --  0.9 0.8 0.9   Glucose 206*  --   --  201*  --   --  217* 201* 253*   Calcium 9.2  --   --  9.4  --   --  9.0 9.3 9.4   More results in Results Review = values in this interval not displayed.      Recent Labs  Lab 10/12/17  0728   ALT 17   AST (SGOT) 15   Bilirubin, Total 0.5   Albumin 3.9   Alkaline Phosphatase 78         Recent Labs  Lab 10/12/17  0728   Troponin I <0.01             Microbiology Results     Procedure Component Value Units Date/Time    MRSA culture - Nares [[id number]] Collected:  10/13/17 1405    Specimen:  Body Fluid from Nares Updated:  10/13/17 1405    MRSA culture - Throat [[id number]] Collected:  10/13/17 1405    Specimen:  Body Fluid from Throat Updated:  10/13/17 1405           RADIOLOGY     Upon my review:    [name]  2:42 PM 10/16/2017

## 2017-10-16 NOTE — PT Progress Note (Signed)
[facility]  Physical Therapy Treatment    Patient:  [name]  MRN#:  [id number]  Unit:  [facility]  Room/Bed:  A2714/A2714-01    Time of treatment:  Time Calculation  PT Received On: 10/16/17  Start Time: 1548  Stop Time: 1626  Time Calculation (min): 38 min            Chart Review and Collaboration with Care Team: 6 minutes, not included in above time.    PT Visit Number: 2    Precautions:   Precautions  Weight Bearing Status: no restrictions  Other Precautions: falls; limb RUE    Updated X-Rays/Tests/Labs:  Lab Results   Component Value Date/Time    HGB 14.1 10/16/2017 04:26 AM    HCT 47.9 10/16/2017 04:26 AM    K 3.8 10/16/2017 04:26 AM    NA 141 10/16/2017 04:26 AM    TROPI <0.01 10/12/2017 07:28 AM       All imaging reviewed, please see chart for details.      Subjective: Pt reports he got up earlier and was able to shower with nursing assist.     Patient Goal: manage nausea    Pain Assessment  Pain Assessment: Numeric Scale (0-10)  Pain Score: 6-moderate pain  POSS Score: Awake and Alert  Pain Location: Toe (Comment which one) (LLE great toe gout pain )  Pain Orientation: Left  Pain Descriptors: Burning;Discomfort  Pain Frequency: Increases with movement  Effect of Pain on Daily Activities: mild  Pain Intervention(s): Repositioned  Multiple Pain Sites: No           Patient's medical condition is appropriate for Physical Therapy intervention at this time.  Patient is agreeable to participation in the therapy session. Nursing clears patient for therapy.      Objective:    Observation of Patient/Vital Signs:    Vitals Check#1 (supine) BP:151/78 HR:78  O2:98     Vitals Check#2 (standing) BP:154/81 HR:82  O2:98    Vitals Check#3 (In bedside chair after ambulating) BP:163/104  HR:101  O2:99 **RN was informed of patients vitals.     Patient received in bed with telemetry  in place.    Cognition/Neuro Status  Arousal/Alertness: Appropriate responses to  stimuli  Attention Span: Appears intact  Orientation Level: Oriented X4  Memory: Appears intact  Following Commands: Follows all commands and directions without difficulty  Safety Awareness: minimal verbal instruction  Insights: Decreased awareness of deficits;Educated in safety awareness  Problem Solving: minimal assistance  Behavior: calm;cooperative;attentive  Motor Planning: intact  Coordination: intact    Musculoskeletal Examination  Gross ROM  Right Upper Extremity ROM: within functional limits  Left Upper Extremity ROM: within functional limits  Right Lower Extremity ROM: within functional limits  Left Lower Extremity ROM: within functional limits                  Transfers  Bed to Chair: Contact Guard Assist  Device Used for Functional Transfer: front-wheeled walker  Locomotion  Ambulation: Contact Guard Assist;Minimal Assist;with front-wheeled walker  Pattern: Narrow BOS;Step through;decreased step length;decreased cadence (with left deviation )  Distance Walked (ft) (Step 6,7): 80 Feet    Therapeutic Exercise  Ankle Pumps: 2 sets of 10 reps.   Mini Squats, hip abduction, marches using RW for support: 2 sets of 10 reps for each.      Neuro Re-Ed  Standing Balance: standing weight shifting all planes;dynamic gait training;with instruction;with support;minimal assist;patient education  Educated the patient to role of physical therapy, plan of care, goals of therapy and safety with mobility and ADLs, energy conservation techniques, home safety.    Patient left in bedside chair with all needs in place and call bed within reach.       Assessment: Pt engaged in bed mobility and transfers with some instruction provided for safety/technique.  Performed ambulation as noted above with no major LOB noted however pt has limited activity tolerance 2/2 feeling lightheaded. Vitals were reassessed and reported to RN, see vitals above.  While ambulating he required increased facilitation  for posture, gait  mechanics and RW handling as he favored ambulating with RW outside his BOS.  Patient also has a tendency to deviate to the left and demonstrated some difficulty navigating around obstacles within the hallway using the RW requiring increased assist and steadying.  Secondary to his decreased balance and safety awareness recommending AR, if not AR patient will need HHPT w/ 24 hour supervision to maximize his safety.   Patient will continue to benefit from PT to address decreased strength, balance, endurance and functional mobility.              PMP Activity: Step 7 - Walks out of Room  Distance Walked (ft) (Step 6,7): 80 Feet      Plan:  Treatment/Interventions: Exercise, Gait training, Stair training, Neuromuscular re-education, Patient/family training, Equipment eval/education      PT Frequency: 4-5x/wk   Continue plan of care.    Goals:  Goals  Goal Formulation: With patient  Time for Goal Acheivement: By time of discharge  Goals: Select goal  Pt Will Go Supine To Sit: modified independent, to maximize functional mobility and independence, Not met  Pt Will Perform Sit To Supine: modified independent, to maximize functional mobility and independence, Not met  Pt Will Perform Sit to Stand: modified independent, to maximize functional mobility and independence, Not met  Pt Will Transfer Bed/Chair: with rolling walker, modified independent, to maximize functional mobility and independence, Not met  Pt Will Ambulate: 151-200 feet, with stand by assist, Not met (with least restrictive device)  Pt Will Go Up / Down Stairs: 3-5 stairs, With rail, with contact guard assist, Not met  Pt Will Perform Home Exer Program: with stand by assist, Not met        DME Recommended for Discharge: Front wheel walker  Discharge Recommendation: Acute Rehab (if not AR will need HHPT with 24/hr supervision )    Carney Living, PT, DPT [phone]  10/16/2017  4:32 PM

## 2017-10-17 LAB — BASIC METABOLIC PANEL
Anion Gap: 10 (ref 5.0–15.0)
BUN: 10 mg/dL (ref 9.0–28.0)
CO2: 25 mEq/L (ref 22–29)
Calcium: 8.9 mg/dL (ref 8.5–10.5)
Chloride: 104 mEq/L (ref 100–111)
Creatinine: 0.8 mg/dL (ref 0.7–1.3)
Glucose: 198 mg/dL — ABNORMAL HIGH (ref 70–100)
Potassium: 3.9 mEq/L (ref 3.5–5.1)
Sodium: 139 mEq/L (ref 136–145)

## 2017-10-17 LAB — ECG 12-LEAD
Atrial Rate: 91 {beats}/min
P Axis: 63 degrees
P-R Interval: 146 ms
Q-T Interval: 350 ms
QRS Duration: 110 ms
QTC Calculation (Bezet): 430 ms
R Axis: 61 degrees
T Axis: -86 degrees
Ventricular Rate: 91 {beats}/min

## 2017-10-17 LAB — CBC
Absolute NRBC: 0 10*3/uL (ref 0.00–0.00)
Hematocrit: 47.6 % (ref 37.6–49.6)
Hgb: 14 g/dL (ref 12.5–17.1)
MCH: 26.3 pg (ref 25.1–33.5)
MCHC: 29.4 g/dL — ABNORMAL LOW (ref 31.5–35.8)
MCV: 89.3 fL (ref 78.0–96.0)
MPV: 9.8 fL (ref 8.9–12.5)
Nucleated RBC: 0 /100 WBC (ref 0.0–0.0)
Platelets: 214 10*3/uL (ref 142–346)
RBC: 5.33 10*6/uL (ref 4.20–5.90)
RDW: 13 % (ref 11–15)
WBC: 7.35 10*3/uL (ref 3.10–9.50)

## 2017-10-17 LAB — HEMOLYSIS INDEX: Hemolysis Index: 13 (ref 0–18)

## 2017-10-17 LAB — GFR: EGFR: >60

## 2017-10-17 LAB — GLUCOSE WHOLE BLOOD - POCT
Whole Blood Glucose POCT: 213 mg/dL — ABNORMAL HIGH (ref 70–100)
Whole Blood Glucose POCT: 295 mg/dL — ABNORMAL HIGH (ref 70–100)
Whole Blood Glucose POCT: 356 mg/dL — ABNORMAL HIGH (ref 70–100)
Whole Blood Glucose POCT: 308 mg/dL — ABNORMAL HIGH (ref 70–100)

## 2017-10-17 MED ORDER — MORPHINE SULFATE 4 MG/ML IJ/IV SOLN (WRAP)
3.0000 mg | Status: DC | PRN
Start: 2017-10-17 — End: 2017-10-18
  Administered 2017-10-17: 3 mg via INTRAVENOUS
  Filled 2017-10-17: qty 1

## 2017-10-17 MED ORDER — METHYLPREDNISOLONE SODIUM SUCC 40 MG IJ SOLR
40.00 mg | Freq: Once | INTRAMUSCULAR | Status: AC
Start: 2017-10-17 — End: 2017-10-17
  Administered 2017-10-17: 10:00:00 40 mg via INTRAVENOUS
  Filled 2017-10-17: qty 1

## 2017-10-17 MED ORDER — COLCHICINE 0.6 MG PO TABS
0.60 mg | ORAL_TABLET | Freq: Two times a day (BID) | ORAL | Status: DC
Start: 2017-10-17 — End: 2017-10-17

## 2017-10-17 MED ORDER — MORPHINE SULFATE 4 MG/ML IJ/IV SOLN (WRAP)
3.0000 mg | Freq: Once | Status: AC
Start: 2017-10-17 — End: 2017-10-17
  Administered 2017-10-17: 10:00:00 3 mg via INTRAVENOUS
  Filled 2017-10-17: qty 1

## 2017-10-17 MED ORDER — ACETAMINOPHEN-CODEINE #3 300-30 MG PO TABS
1.0000 | ORAL_TABLET | ORAL | Status: DC | PRN
  Administered 2017-10-17 – 2017-10-18 (×5): 1 via ORAL
  Filled 2017-10-17 (×5): qty 1

## 2017-10-17 MED ORDER — MORPHINE SULFATE 15 MG PO TABS
7.50 mg | ORAL_TABLET | Freq: Once | ORAL | Status: AC
Start: 2017-10-17 — End: 2017-10-17
  Administered 2017-10-17: 05:00:00 7.5 mg via ORAL
  Filled 2017-10-17: qty 1

## 2017-10-17 MED ORDER — PREDNISONE 10 MG PO TABS
40.00 mg | ORAL_TABLET | Freq: Every morning | ORAL | Status: DC
Start: 2017-10-18 — End: 2017-10-18
  Administered 2017-10-18: 06:00:00 40 mg via ORAL
  Filled 2017-10-17: qty 4

## 2017-10-17 MED ORDER — COLCHICINE 0.6 MG PO TABS
0.60 mg | ORAL_TABLET | Freq: Every day | ORAL | Status: DC
Start: 2017-10-17 — End: 2017-10-18
  Administered 2017-10-18: 10:00:00 0.6 mg via ORAL
  Filled 2017-10-17 (×2): qty 1

## 2017-10-17 NOTE — Progress Notes (Signed)
Face to face encounter, H&P, consult notes, face sheet and attending MD notes faxed to PCP office. The office will setup home health services (SN\PT\OT) as soon as patient return to [facility]. DME company will deliver rolling walker to patient home address.

## 2017-10-17 NOTE — Progress Notes (Signed)
[facility]  [facility]        Patient Name: [name], [name]     MRN: [id number]     CSN: [id number]       Account Information    Hosp Acct #   [id number] Patient Class   Inpatient Service  Medicine Accommodation Code  Intensive Care     Admission Information    Admitting Physician:  Attending Physician: Noralee Space, MD  Betsey Holiday, MD Unit  [facility]* L&D Status     Admitting Diagnosis: Gait difficulty; Acute nonintractable headache, unspecified headache type;* Room / Bed  A2714/A2714-01 L&D - Last Menstrual Cycle     Chief Complaint: Headache; Dizziness     Admit Type:  Admit Date/Time:  Discharge Date/Time: Emergency  10/12/2017 / 6644   /  Length of Stay: 5 Days   L&D EDD   Estimated Date of Delivery: None noted.     Patient Information            Home Address: [address]  [place] [address] Employer:  Employer Address: [employer] Phone: [phone]  [phone] Employer Phone:    SSN: [id number]     DOB: [birth year] (52 yrs)     Sex: Male Primary Care Physician: [name] Rumpf, MD   Marital Status: Single Referring Physician:       No ref. provider found   Race: Black or African American     Ethnicity: Non Hispanic/Latino     Emergency Contacts  Name Home Phone Work Phone Mobile Phone Relationship [name], [name] [phone]  [phone] Mother    [name]   [phone] Significant*         Guarantor Information    Guarantor Name: [name], [name] Guarantor ID: [id number]   Guarantor Relationship to Pt: Self Guarantor Type: Personal/Family   Guarantor DOB:   December 02, 1965     Guarantor Address: [address]   [place], [place] [address]       Guarantor Home Phone: [phone] Guarantor Employer:        Guarantor Work Phone:  Pharmacologist Phone:               [employer]    Insurance Name: Serena [id number] NON OPTIONS Subscriber Name: [employer]   Insurance  Address:   [address]  [place], [place] [address] Subscriber DOB: April 14, 1965     Subscriber ID: [id number]   Insurance Phone:  Pt Relationship to Sub:   Self   Insurance ID:      Group Name:  Preauthorization #: [id number]   Group #: [id number] Preauthorization Days:                                                                                                10/17/2017 3:42 PM

## 2017-10-17 NOTE — Progress Notes (Addendum)
LOS # 5    Summary of Discharge Plan: [facility]HH w/ DME and SO to transport.      Identified Possible Discharge Barriers: Acute left cerebellar infarction: Unsteady gait with blurred vision improving currently ambulating well with a frontwheel walker    PT recs AR w/ FWW(if not AR will need HHPT with 24/hr supervision )    CM spoke with pt and SO at the bedside re: discharge plan. Pt and SO preferred HHH. SO is pt's assist at home.    Pt's neurologist appt in [place] is on 11/07/2017. PCP is pending    CM Interventions and Outcome: see above. CM informed Alfred, [facility], re: HH PT/OT/nursing w/ FWW.    Pt's PCP is Dr. Aurora Mask, [phone].    Discussed above Discharge Plan with (patient, family, Care Team, others): yes      Case Management will continue to following on patient's discharge needs.    Janie Morning  Case Management  [phone]

## 2017-10-17 NOTE — Progress Notes (Signed)
Home Health face-to-face (FTF) Encounter (Order [id number])   Consult   Date: 10/17/2017 Department: [name] Ordering/Authorizing: Betsey Holiday, MD   Order Information     Order Date/Time Release Date/Time Start Date/Time End Date/Time   10/17/17 03:12 PM None 10/17/17 03:08 PM 10/17/17 03:08 PM   Order Details     Frequency Duration Priority Order Class   Once 1 occurrence Routine Hospital Performed   Standing Order Information     Remaining Occurrences Interval Last Released      0/1 Once 10/17/2017            Provider Information     Ordering User Ordering Provider Authorizing Provider   Hoover Brunette, RN Betsey Holiday, MD Betsey Holiday, MD   Attending Provider(s) Admitting Provider PCP   Maryella Shivers, MD; Noralee Space, MD; Berenice Bouton, MD; Maple Mirza, MD; Lattie Haw, MD; Betsey Holiday, MD Noralee Space, MD Bobby Rumpf, MD   Verbal Order Info     Action Created on Order Mode Entered by Responsible Provider Signed by Signed on   Ordering 10/17/17 1512 Telephone with Cornelious Bryant, RN Betsey Holiday, MD     Comments     Rolling walker needed >99 months,     NPI: [id number]    Gait difficulty [R26.9]  Acute nonintractable headache, unspecified headache type [R51]  Cerebrovascular accident (CVA), unspecified mechanism [I63.9]    Ht: 1.702 m (5\' 7" )  Wt: 88 kg (194 lb 0.1 oz)               Order Questions     Question Answer Comment   Date of face-to-face (FTF) encounter: 10/17/2017    Medical conditions that necessitate Home Health care: B. Functional impairment due to recent hospitalization/procedure/treatment     C. Risk for complication/infection/pain requiring follow up and monitoring     D. Chronic illness & risk for re-hospitalization due to unstable disease status     E. Exacerbation of disease requiring follow up monitoring     H. Multiple new medications requiring management and monitoring      F. New diagnosis & treatment requiring follow up monitoring and management    Clinical findings that support the need for Skilled Nursing. SN will: C. Monitor for signs and symptoms of exacerbation of disease and management     D. Review medication reconciliation, manage and educate on use and side effects     G. Educate on new diagnosis, treatment & management to prevent re-hospitalization     H. Assess cardiopulmonary status and monitor for signs &symptoms of exacerbation     I. Educate dietary and or fluid restrictions and weight management    Clinical findings that support the need for Physical Therapy. PT will A. Evaluate and treat functional impairment and improve mobility     C. Educate on weight bearing status, stair/gait training, balance & coordination     D. Provide services to help restore function, mobility, and releive pain     G. Implement activities to improve stance time, cadence & step length     H. Educate on the safe use of assistive device/ durable medical equipment     I. Instruct on restorative activities to restore ability to perform ADL    Clinical findings support the need for OT (needs SN/PT order).OT will A. Develop in home program to improve ability to perform ADLs  B. Develop restorative program to improve mobility and independence     C. Educate on recovery and maintenance skills     D. Instruct on strategies to compensate for loss of function     E. Educate on basic motor function and reasoning abilities    Clinical findings that support the need for SLP. ST will F. N/A    Per clinical findings, following services are medically necessary: Skilled Nursing     PT     OT     Speech Language Pathology    Evidence this patient is homebound because: B. Profound weakness, poor balance/unsteady gait d/t illness/treatment/procedure     C. Decreased endurance, strength, ROM, cadence, safety/judgment during mobility     F. Deconditioned due to advance disease process  requiring assistance to leave home    DME Rolling Walker    Other (please specify) PCP is Dr. Aurora Mask, [phone] comment section for additional order.          Process Instructions     Please select Home Care Services medically necessary.     Based on the above findings, I certify that this patient is confined to the home and needs intermittent skilled nursing care, physical therapry and / or speech therapy or continues to need occupational therapy. The patient is under my care, and I have initiated the establishment of the plan of care. This patient will be followed by a physician who will periodically review the plan of care.         Collection Information     Consult Order Info     ID Description Priority Start Date Start Time   [id number] Home Health face-to-face (FTF) Encounter Routine 10/17/2017 3:08 PM   Provider Specialty Referred to   ______________________________________ _____________________________________   Acknowledgement Info     For At Acknowledged By Acknowledged On   Placing Order 10/17/17 1512 Hoover Brunette, RN 10/17/17 1512   Verbal Order Info     Action Created on Order Mode Entered by Responsible Provider Signed by Signed on   Ordering 10/17/17 1512 Telephone with Cornelious Bryant, RN Betsey Holiday, MD     Patient Information     Patient Name  [name], [name] Sex  Male DOB  05/13/1965   Additional Information     Associated Reports External References   Priority and Order Details [employer]

## 2017-10-17 NOTE — Progress Notes (Signed)
[facility]  PROGRESS NOTE      Patient: [name]  Date: 10/17/2017   LOS: 5 Days  Admission Date: 10/12/2017   MRN: [id number]  Attending: Betsey Holiday  Please contact me on the following Pager [phone]       ASSESSMENT/PLAN     Eman Morimoto is a 52 y.o. 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  Patient was started on aspirin and statin. he needs to start plavix in a few weeks for intracranial stenoses (not started here due to petechial hemorrhages). Patient has now improved significantly. He wants to return to [facility] and plans to get neurologist in [place]. Currently with acute gout flare limiting ambulation.    Interval Summary:     Patient Active Hospital Problem List:    L cerebellar hemisphere infarction  Acute nonintractable headache, unspecified headache type  Midline shift  - CT scan reviewed  - MRI/ MRA reviewed  - Echo reviewed- no vegetation/thrombus seen  - TEE to be done outpatient  - hypercoag workup pending  - ASA and statin  - Neurology onboard- will eventually need ASA + Plavix for diffuse intracranial plaques  - Neurosurgery consulted  - PT recommended acute rehab but patient reports his ambulation is limited by acute gout flare     Gait instability with dizziness  - 2/2 above  - Treatment as above    History of hypertension (10/12/2017)  - Stable  - Monitor BP    Uncontrolled type 2 diabetes mellitus with hyperglycemia, with long-term current use of insulin (10/12/2017)  - Uncontrolled  - Correctional insulin  - Hb A1c 8.6, will resume home meds at time of discharge    Acute gout flare   Involving R 1st toe and left knee.   --prednisone  --colchicine   --pain meds prn       Nutrition: Regular    DVT Prophylaxis: Lovenox        Code Status: Full    DISPO: likely d/c tomorrow; plan to return home  to [place].     Family Contact: fiance       SUBJECTIVE     Aldyn Toon reports pain in left knee and right 1st toe and reports it is a gout flare.    MEDICATIONS     Current Facility-Administered Medications   Medication Dose Route Frequency   . amLODIPine  5 mg Oral Daily   . aspirin  81 mg Oral Daily   . atorvastatin  40 mg Oral QHS   . colchicine  0.6 mg Oral Daily   . enoxaparin  40 mg Subcutaneous Daily   . insulin lispro  1-4 Units Subcutaneous QHS   . insulin lispro  1-8 Units Subcutaneous TID AC   . lisinopril  40 mg Oral Daily   . [START ON 10/18/2017] predniSONE  40 mg Oral QAM W/BREAKFAST       PHYSICAL EXAM     Vitals:    10/17/17 1600   BP: 130/79   Pulse: (!) 104   Resp: 16   Temp: 98.6 F (37 C)   SpO2: 97%       Temperature: Temp  Min: 98 F (36.7 C)  Max: 98.6 F (37 C)  Pulse: Pulse  Min: 79  Max: 104  Respiratory: Resp  Min: 16  Max: 21  Non-Invasive BP: BP  Min: 130/79  Max: 171/100  Pulse Oximetry SpO2  Min: 95 %  Max: 99 %    Intake and Output Summary (Last 24 hours) at Date Time    Intake/Output Summary (Last 24 hours) at 10/17/17 1722  Last data filed at 10/17/17 1400   Gross per 24 hour   Intake             1080 ml   Output                0 ml   Net             1080 ml         GEN APPEARANCE: sitting up to get to bathroom more comfortable NAD  HEENT: PERLA; EOMI; Conjunctiva Clear  NECK: Supple; No bruits  CVS: RRR, S1, S2; No M/G/R  LUNGS: CTAB; No Wheezes; No Rhonchi: No rales  ABD: Soft; No TTP; + Normoactive BS  EXT: No edema; pain with movement of right 1st toe and left knee; mild warmth, no redness noted  Skin exam:  pink  NEURO: nystagmus. No ataxia on finger nose finger testing. Strength intact  CAP REFILL:  Normal  MENTAL STATUS:  Normal    Exam done by Betsey Holiday 10/17/2017 5:25 PM     LABS       Recent Labs  Lab 10/17/17  0405 10/16/17  0426 10/15/17  0149   WBC 7.35 7.91 8.05   RBC 5.33 5.35 5.12   Hgb 14.0 14.1 13.9   Hematocrit 47.6 47.9 46.3   MCV 89.3 89.5  90.4   Platelets 214 250 258         Recent Labs  Lab 10/17/17  0405 10/16/17  0426 10/15/17  0919 10/15/17  0548 10/15/17  0149  10/14/17  0730 10/14/17  0202   Sodium 139 141 145 147* 145 More results in Results Review 142 141   Potassium 3.9 3.8  --   --  4.3  --  4.5 4.5   Chloride 104 107  --   --  111  --  109 107   CO2 25 23  --   --  24  --  25 26   BUN 10.0 11.0  --   --  12.0  --  15.0 17.0   Creatinine 0.8 0.8  --   --  0.8  --  0.9 0.8   Glucose 198* 206*  --   --  201*  --  217* 201*   Calcium 8.9 9.2  --   --  9.4  --  9.0 9.3   More results in Results Review = values in this interval not displayed.      Recent Labs  Lab 10/12/17  0728   ALT 17   AST (SGOT) 15   Bilirubin, Total 0.5   Albumin 3.9   Alkaline Phosphatase 78         Recent Labs  Lab 10/12/17  0728   Troponin I <0.01             Microbiology Results     Procedure Component Value Units Date/Time    MRSA culture - Nares [[id number]] Collected:  10/13/17 1405    Specimen:  Body Fluid from Nares Updated:  10/13/17 1405    MRSA culture - Throat [[id number]] Collected:  10/13/17 1405    Specimen:  Body Fluid from Throat Updated:  10/13/17  1405           RADIOLOGY     Imaging reviewed    Signed,  Betsey Holiday  5:22 PM 10/17/2017

## 2017-10-17 NOTE — Plan of Care (Signed)
Problem: Pain  Goal: Pain at adequate level as identified by patient  Outcome: Progressing   10/17/17 1514   Goal/Interventions addressed this shift   Pain at adequate level as identified by patient Identify patient comfort function goal;Reassess pain within 30-60 minutes of any procedure/intervention, per Pain Assessment, Intervention, Reassessment (AIR) Cycle;Evaluate if patient comfort function goal is met;Offer non-pharmacological pain management interventions;Assess pain on admission, during daily assessment and/or before any "as needed" intervention(s)       Problem: Neurological Deficit  Goal: Neurological status is stable or improving  Outcome: Progressing   10/17/17 1514   Goal/Interventions addressed this shift   Neurological status is stable or improving Monitor/assess/document neurological assessment (Stroke: every 4 hours);Observe for seizure activity and initiate seizure precautions if indicated;Perform CAM Assessment       Problem: Every Day - Stroke  Goal: Stable vital signs and fluid balance  Outcome: Progressing   10/17/17 1514   Goal/Interventions addressed this shift   Stable vital signs and fluid balance  Position patient for maximum circulation/cardiac output;Monitor and assess vitals every 4 hours or as ordered and hemodynamic parameters;Apply telemetry monitor as ordered     Goal: Neurological status is stable or improving  Outcome: Progressing   10/17/17 1514   Goal/Interventions addressed this shift   Neurological status is stable or improving Monitor/assess/document neurological assessment (Stroke: every 4 hours);Observe for seizure activity and initiate seizure precautions if indicated;Perform CAM Assessment       Comments: Plan for pt to discharge this afternoon or tomorrow once gout pain more under control. TEE will be done outpatient. Safety precautions in place. Family at bedside.

## 2017-10-17 NOTE — Progress Notes (Signed)
ICU Daily Progress Note                                                                                                 [phone]    Date Time: 10/17/17 11:56 AM  Patient Name: [name],[name] 52 y.o. male admitted with Acute nonintractable headache, unspecified headache type  Attending Physician: Betsey Holiday, MD  Room: [phone]   Admit Date: 10/12/2017  LOS: 5 days    Patient status: Inpatient  Hospital Day: 5       ICU PROBLEM LIST =      Assessment:     Acute left cerebellar infarction: Unsteady gait with blurred vision improving currently ambulating well with a frontwheel walker.  Initial recommendation for acute rehab.  Discussed with neurology and cardiology, TEE could be done as an outpatient.    Accelerated hypertension: Much improved blood pressure within normal therapeutic range.    Acute gout flare: Trial of colchicine, prednisone if no improvement.    Hyperglycemia: Continue Accu-Chek monitoring sliding scale coverage.    Discussed with patient patient wife at bedside along with nursing staff possible discharge later today.      Plan:     As detailed above    Code Status: full code    Plan of care discussed with ICU nurse, pharmacy, nutritionist and respiratory therapy  Subjective:     Up in chair feeling better complaining of right toe pain.  Awaiting for possible TEE today.        Medications:      Scheduled Meds: PRN Meds:      amLODIPine 5 mg Oral Daily   aspirin 81 mg Oral Daily   atorvastatin 40 mg Oral QHS   colchicine 0.6 mg Oral Daily   enoxaparin 40 mg Subcutaneous Daily   insulin lispro 1-4 Units Subcutaneous QHS   insulin lispro 1-8 Units Subcutaneous TID AC   lisinopril 40 mg Oral Daily   [START ON 10/18/2017] predniSONE 40 mg Oral QAM W/BREAKFAST       Continuous Infusions:     acetaminophen 650 mg Q4H PRN   acetaminophen-codeine 1 tablet Q4H PRN   butalbital-acetaminophen-caffeine 1 tablet Q6H PRN   hydrALAZINE 10 mg Q4H PRN   naloxone 0.2 mg PRN   ondansetron 4 mg Q4H  PRN   Or     ondansetron 4 mg Q6H PRN   promethazine 12.5 mg Q6H PRN   senna-docusate 2 tablet Daily PRN         Review of Systems:      General ROS:  Afebrile no weight loss weight gain   ENT ROS:  No vision changes, No headache, No sore throat, no nasal discharge   Endocrine ROS:  No fatigue   Respiratory ROS:  No shortness of breath, wheezing, cough, chest congestion    Cardiovascular ROS:  No chest pain or palpitation, no edema   Gastrointestinal ROS:  No nausea vomiting diarrhea. No anorexia No melanotic stool   Genito-Urinary ROS:  No burning in the urine or hematuria   Musculoskeletal ROS: Right foot/toe pain.   Neurological ROS:  mild unsteady gait ,no particular weakness.   Dermatological ROS:  No skin rash      Physical Exam:       General appearance - no visible respiratory distress patient does not appear toxic  Mental status -   Alert and oriented x3  Eyes - EOMI No Icterus PERRL  Nose - no nasal discharge  Mouth - mucous membrane is moist.   Neck - no JVD, neck is supple, no  lymphadenopathy or thyromegaly  Chest - clear to auscultation  Heart - S1-S2 RRR no S3-S4 no murmur, sinus  Abdomen - soft nontender bowel sounds are normal with no hepatosplenomegaly  Neurological - no motor or sensory deficit, unsteady gait with ambulation using a walker  Extremities - no edema, pulses 2/4, no  clubbing or cyanosis,  Skin - no skin rash  Capillary refill time - <3 sec         Data:     Invasive ICU Hemodynamics:                     IBW/kg (Calculated) : 66.1    ABGs:         Invalid input(s): STATS, O2DEL, O2FLO, PRESS, VNTMN, PRSUP    Vent Settings:  IBW:      Patient Lines/Drains/Airways Status    Active PICC Line / CVC Line / PIV Line / Drain / Airway / Intraosseous Line / Epidural Line / ART Line / Line / Wound / Pressure Ulcer / NG/OG Tube     Name:   Placement date:   Placement time:   Site:   Days:    PICC Double Lumen 10/13/17 Right Basilic  10/13/17    1700    Basilic    3    Peripheral IV  10/12/17 Right Antecubital  10/12/17    1601    Antecubital    4              VITAL SIGNS   Temp:  [98 F (36.7 C)-98.6 F (37 C)] 98.4 F (36.9 C)  Heart Rate:  [78-111] 97  Resp Rate:  [18] 18  BP: (145-171)/(74-104) 146/85  Pulse ox:    Intake/Output Summary (Last 24 hours) at 10/17/17 1156  Last data filed at 10/16/17 1400   Gross per 24 hour   Intake              240 ml   Output                0 ml   Net              240 ml        Labs:     CBC w/Diff CMP     Recent Labs  Lab 10/17/17  0405 10/16/17  0426 10/15/17  0149  10/12/17  0728   WBC 7.35 7.91 8.05 More results in Results Review 6.71   Hgb 14.0 14.1 13.9 More results in Results Review 14.8   Hematocrit 47.6 47.9 46.3 More results in Results Review 49.7*   Platelets 214 250 258 More results in Results Review 271   MCV 89.3 89.5 90.4 More results in Results Review 89.2   Neutrophils  --   --   --   --  52.5   More results in Results Review = values in this interval not displayed.    PT/INR           Recent Labs  Lab 10/17/17  0405 10/16/17  0426 10/15/17  0919  10/15/17  0149  10/12/17  0728   Sodium 139 141 145 More results in Results Review 145 More results in Results Review 138   Potassium 3.9 3.8  --   --  4.3 More results in Results Review 4.8   Chloride 104 107  --   --  111 More results in Results Review 101   CO2 25 23  --   --  24 More results in Results Review 24   BUN 10.0 11.0  --   --  12.0 More results in Results Review 28.0   Creatinine 0.8 0.8  --   --  0.8 More results in Results Review 1.2   Glucose 198* 206*  --   --  201* More results in Results Review 361*   Calcium 8.9 9.2  --   --  9.4 More results in Results Review 10.0   Protein, Total  --   --   --   --   --   --  7.8   Albumin  --   --   --   --   --   --  3.9   AST (SGOT)  --   --   --   --   --   --  15   ALT  --   --   --   --   --   --  17   Alkaline Phosphatase  --   --   --   --   --   --  78   Bilirubin, Total  --   --   --   --   --   --  0.5   More results in Results  Review = values in this interval not displayed.   Glucose POCT     Recent Labs  Lab 10/17/17  0405 10/16/17  0426 10/15/17  0149 10/14/17  0730 10/14/17  0202 10/13/17  2058 10/13/17  1640   Glucose 198* 206* 201* 217* 201* 253* 240*          Recent Labs  Lab 10/12/17  0728   Troponin I <0.01       Urinalysis    Recent Labs  Lab 10/12/17  0728   Urine Type Urine, Clean Ca   Color, UA Straw   Clarity, UA Clear   Specific Gravity UA 1.026   Urine pH 6.0   Nitrite, UA Negative   Ketones UA Negative   Urobilinogen, UA Negative   Bilirubin, UA Negative   Blood, UA Negative   RBC, UA 0 - 2   WBC, UA 0 - 5       Rads:   No results found.        I have personally reviewed the patient's history and 24 hour interval events, along with vitals, labs, radiology images.    So far today I have spent 30 minutes providing care for this patient excluding teaching and billable procedures, and not overlapping with any other providers.:     Margarite Gouge, MD   10/17/17 11:56 AM

## 2017-10-17 NOTE — Plan of Care (Signed)
Problem: Physical Therapy  Goal: By discharge, patient will perform mobility at the patient's highest functional potential. See PT evaluation/note for goals.  Outcome: Progressing  Discharge Recommendation: Acute Rehab (if not AR will need HHPT with 24/hr supervision )  DME Recommended for Discharge: Front wheel walker    Is an Occupational Therapy Evaluation Indicated at this time? This patient is already on OT caseload.     Treatment/Interventions: Exercise, Gait training, Stair training, Neuromuscular re-education, Patient/family training, Equipment eval/education  PT Frequency: 4-5x/wk     Next Visit Recommendation: PT - Next Visit Recommendation: 10/18/17     PMP Activity: Step 7 - Walks out of Room  Distance Walked (ft) (Step 6,7): 80 Feet  (Please See Therapy Evaluation for device and assistance level needed)    Goals:   Goals  Goal Formulation: With patient  Time for Goal Acheivement: By time of discharge  Goals: Select goal  Pt Will Go Supine To Sit: modified independent, to maximize functional mobility and independence, Partly met  Pt Will Perform Sit To Supine: modified independent, to maximize functional mobility and independence, Partly met  Pt Will Perform Sit to Stand: modified independent, to maximize functional mobility and independence, Partly met  Pt Will Transfer Bed/Chair: with rolling walker, modified independent, to maximize functional mobility and independence, Partly met  Pt Will Ambulate: 151-200 feet, with stand by assist, Not met (with least restrictive device)  Pt Will Go Up / Down Stairs: 3-5 stairs, With rail, with contact guard assist, Not met  Pt Will Perform Home Exer Program: with stand by assist, Partly met

## 2017-10-17 NOTE — PT Progress Note (Signed)
Physical Therapy Note    [facility]  Physical Therapy Treatment    Patient:  [name]  MRN#:  [id number]  Unit:  [facility]  Room/Bed:  A2714/A2714-01    Time of treatment:  Time Calculation  PT Received On: 10/17/17  Start Time: 1806  Stop Time: 1830  Time Calculation (min): 24 min            Chart Review and Collaboration with Care Team: 5 minutes, not included in above time.    PT Visit Number: 1    Precautions:   Precautions  Weight Bearing Status: no restrictions  Other Precautions: falls; limb RUE    Updated X-Rays/Tests/Labs:  Lab Results   Component Value Date/Time    HGB 14.0 10/17/2017 04:05 AM    HCT 47.6 10/17/2017 04:05 AM    K 3.9 10/17/2017 04:05 AM    NA 139 10/17/2017 04:05 AM    TROPI <0.01 10/12/2017 07:28 AM       All imaging reviewed, please see chart for details.      Subjective:  "I have a lot pain."    Patient Goal: manage nausea    Pain Assessment  Pain Assessment:  (Pt reports 9/10 left knee and right great toe )  Pain Frequency: Intermittent;Increases with movement  Pain Intervention(s): Medication (See eMAR)           Patient's medical condition is appropriate for Physical Therapy intervention at this time.  Patient is agreeable to participation in the therapy session. Nursing clears patient for therapy.      Objective:  Observation of Patient/Vital Signs:  Vitals:    10/17/17 1600   BP: 130/79   Pulse: (!) 104   Resp: 16   Temp: 98.6 F (37 C)   SpO2: 97%         Patient received in bed with telemetry  and intravenous (IV) line in place.    Cognition/Neuro Status  Arousal/Alertness: Appropriate responses to stimuli  Attention Span: Appears intact  Orientation Level: Oriented X4  Memory: Appears intact  Following Commands: Follows all commands and directions without difficulty  Safety Awareness: minimal verbal instruction  Insights: Decreased awareness of deficits;Educated in safety awareness  Problem Solving: minimal  assistance  Behavior: calm;cooperative;attentive  Motor Planning: intact  Coordination: intact    Musculoskeletal Examination  Gross ROM  Right Upper Extremity ROM: within functional limits  Left Upper Extremity ROM: within functional limits  Right Lower Extremity ROM: within functional limits  Left Lower Extremity ROM: within functional limits               Functional Mobility  Rolling: Supervision  Supine to Sit: Supervision  Scooting to [facility]: Supervision  Scooting to EOB: Supervision  Sit to Supine: Supervision  Sit to Stand: Stand by Assist  Stand to Sit: Stand by Assist  Transfers  Bed to Chair: Stand by Assist  Device Used for Functional Transfer: front-wheeled walker  Locomotion  Ambulation:  (110 ft x 2 x with FWW with CGA )  Pattern:  (dec hip/knee flex left, dec step length right, dec cadence )  Distance Walked (ft) (Step 6,7): 110 Feet       Therapeutic exercises: B LE's   Heel Slides: AROM 3 x 10  Hip abduction/adduction: 2 x 10  Long Arc Quad: 3 x 10  Seated Marches: 3 x 10              Educated the  patient to role of physical therapy, plan of care, goals of therapy and safety with mobility and ADLs, discharge instructions.    Patient left in bed with bed alarm in place and call bell and all personal items/needs within reach. RN notified of session outcome.       Assessment: Pt has made good gains in functional mobility as demo by improved gait and transfers. Pt continues to be limited by pain in per pt 2/2 gout.  Pt also with noted improved gait.  Pt would continue to benefit from skilled PT services to address deficits. Pt may be good candidate for ARF.  Pt is active participant of skilled PT services.       PMP Activity: Step 7 - Walks out of Room  Distance Walked (ft) (Step 6,7): 110 Feet      Plan:  Treatment/Interventions: Exercise, Gait training, Stair training, Neuromuscular re-education, Patient/family training, Equipment eval/education      PT Frequency: 4-5x/wk   Continue plan of  care.    Goals:  Goals  Goal Formulation: With patient  Time for Goal Acheivement: By time of discharge  Goals: Select goal  Pt Will Go Supine To Sit: modified independent, to maximize functional mobility and independence, Partly met  Pt Will Perform Sit To Supine: modified independent, to maximize functional mobility and independence, Partly met  Pt Will Perform Sit to Stand: modified independent, to maximize functional mobility and independence, Partly met  Pt Will Transfer Bed/Chair: with rolling walker, modified independent, to maximize functional mobility and independence, Partly met  Pt Will Ambulate: 151-200 feet, with stand by assist, Not met (with least restrictive device)  Pt Will Go Up / Down Stairs: 3-5 stairs, With rail, with contact guard assist, Not met  Pt Will Perform Home Exer Program: with stand by assist, Partly met        DME Recommended for Discharge: Front wheel walker  Discharge Recommendation: Acute Rehab (if not AR will need HHPT with 24/hr supervision )    Dois Davenport, PT, MSPT   [phone]  10/17/2017 8:23 PM

## 2017-10-18 LAB — BASIC METABOLIC PANEL
Anion Gap: 11 (ref 5.0–15.0)
BUN: 15 mg/dL (ref 9.0–28.0)
CO2: 23 mEq/L (ref 22–29)
Calcium: 8.7 mg/dL (ref 8.5–10.5)
Chloride: 103 mEq/L (ref 100–111)
Creatinine: 0.8 mg/dL (ref 0.7–1.3)
Glucose: 236 mg/dL — ABNORMAL HIGH (ref 70–100)
Potassium: 4.4 mEq/L (ref 3.5–5.1)
Sodium: 137 mEq/L (ref 136–145)

## 2017-10-18 LAB — CBC
Absolute NRBC: 0 10*3/uL (ref 0.00–0.00)
Hematocrit: 45.6 % (ref 37.6–49.6)
Hgb: 14 g/dL (ref 12.5–17.1)
MCH: 26.7 pg (ref 25.1–33.5)
MCHC: 30.7 g/dL — ABNORMAL LOW (ref 31.5–35.8)
MCV: 86.9 fL (ref 78.0–96.0)
MPV: 9.9 fL (ref 8.9–12.5)
Nucleated RBC: 0 /100 WBC (ref 0.0–0.0)
Platelets: 222 10*3/uL (ref 142–346)
RBC: 5.25 10*6/uL (ref 4.20–5.90)
RDW: 12 % (ref 11–15)
WBC: 7.97 10*3/uL (ref 3.10–9.50)

## 2017-10-18 LAB — GFR: EGFR: >60

## 2017-10-18 LAB — GLUCOSE WHOLE BLOOD - POCT
Whole Blood Glucose POCT: 276 mg/dL — ABNORMAL HIGH (ref 70–100)
Whole Blood Glucose POCT: 392 mg/dL — ABNORMAL HIGH (ref 70–100)

## 2017-10-18 LAB — HEMOLYSIS INDEX: Hemolysis Index: 106 — ABNORMAL HIGH (ref 0–18)

## 2017-10-18 MED ORDER — ALUM & MAG HYDROXIDE-SIMETH 200-200-20 MG/5ML PO SUSP
30.00 mL | ORAL | Status: DC | PRN
Start: 2017-10-18 — End: 2017-10-18

## 2017-10-18 MED ORDER — PREDNISONE 20 MG PO TABS
ORAL_TABLET | ORAL | 0 refills | Status: DC
Start: 2017-10-18 — End: 2017-10-18

## 2017-10-18 MED ORDER — PREDNISONE 20 MG PO TABS
ORAL_TABLET | ORAL | 0 refills | Status: AC
Start: 2017-10-18 — End: ?

## 2017-10-18 MED ORDER — AMLODIPINE BESYLATE 5 MG PO TABS
5.00 mg | ORAL_TABLET | Freq: Every day | ORAL | 0 refills | Status: DC
Start: 2017-10-18 — End: 2017-10-18

## 2017-10-18 MED ORDER — ACETAMINOPHEN-CODEINE #3 300-30 MG PO TABS
1.00 | ORAL_TABLET | Freq: Four times a day (QID) | ORAL | 0 refills | Status: AC | PRN
Start: 2017-10-18 — End: 2017-10-25

## 2017-10-18 MED ORDER — COLCHICINE 0.6 MG PO TABS
0.60 mg | ORAL_TABLET | Freq: Every day | ORAL | 0 refills | Status: AC
Start: 2017-10-18 — End: 2017-10-25

## 2017-10-18 MED ORDER — COLCHICINE 0.6 MG PO TABS
0.60 mg | ORAL_TABLET | Freq: Every day | ORAL | 0 refills | Status: DC
Start: 2017-10-18 — End: 2017-10-18

## 2017-10-18 MED ORDER — ASPIRIN 81 MG PO CHEW
81.00 mg | CHEWABLE_TABLET | Freq: Every day | ORAL | 0 refills | Status: AC
Start: 2017-10-18 — End: ?

## 2017-10-18 MED ORDER — ASPIRIN 81 MG PO CHEW
81.00 mg | CHEWABLE_TABLET | Freq: Every day | ORAL | 0 refills | Status: DC
Start: 2017-10-18 — End: 2017-10-18

## 2017-10-18 MED ORDER — AMLODIPINE BESYLATE 5 MG PO TABS
5.00 mg | ORAL_TABLET | Freq: Every day | ORAL | 0 refills | Status: AC
Start: 2017-10-18 — End: 2017-11-17

## 2017-10-18 MED ORDER — ATORVASTATIN CALCIUM 40 MG PO TABS
40.00 mg | ORAL_TABLET | Freq: Every evening | ORAL | 0 refills | Status: DC
Start: 2017-10-18 — End: 2017-10-18

## 2017-10-18 MED ORDER — METHYLPREDNISOLONE SODIUM SUCC 40 MG IJ SOLR
40.00 mg | Freq: Once | INTRAMUSCULAR | Status: AC
Start: 2017-10-18 — End: 2017-10-18
  Administered 2017-10-18: 10:00:00 40 mg via INTRAVENOUS
  Filled 2017-10-18: qty 1

## 2017-10-18 MED ORDER — ATORVASTATIN CALCIUM 40 MG PO TABS
40.00 mg | ORAL_TABLET | Freq: Every evening | ORAL | 0 refills | Status: AC
Start: 2017-10-18 — End: ?

## 2017-10-18 NOTE — Discharge Summary (Signed)
[facility]      Patient: [name]  Admission Date: 10/12/2017   DOB: 09-Oct-1965  Discharge Date: 10/18/2017   MRN: [id number]  Discharge Attending:Baer Hinton Hezzie Bump     Referring Physician: Bobby Rumpf, MD  PCP: Bobby Rumpf, MD       DISCHARGE SUMMARY     Discharge Information   Admission Diagnosis:   Acute nonintractable headache, unspecified headache type    Discharge Diagnosis:   Acute CVA of L cerebellar hemisphere   Intractable headache, improved   Gait instability with dizziness, improved   Acute gout flare  HN  DM2     Admission Condition: Guarded  Discharge Condition: Stable  Consultants: neurology, NSG  Functional Status: ad lib   Discharged to: home with [facility]    Imaging:     Ct Head Wo Contrast    Result Date: 10/14/2017   Evolving left cerebellar stroke with petechial hemorrhage, similar to the prior MRI of the brain. Joselyn Glassman, MD 10/14/2017 3:08 PM    Ct Head Without Contrast    Result Date: 10/12/2017   Extensive hypodensity through the left cerebellar hemisphere most likely age-indeterminate ischemic change. Note: Note that CT scanning at this site  utilizes multiple dose reduction techniques including automatic exposure control, adjustment of the MAA and/or KVP according to patient's size and use of iterative reconstruction technique Laurena Slimmer, MD 10/12/2017 8:21 AM    Mra Head (intracranial) Without Contrast    Result Date: 10/12/2017  1. MRA Neck Vessels: No significant carotid stenosis. Absent antegrade flow in the left vertebral artery which appears occluded proximally. 2. 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 The anterior circulation is normal. 3. The findings were discussed with the attending neurologist at the time of interpretation. Heron Nay, MD 10/12/2017 9:44 PM    Mra Neck With / Without Contrast    Result Date: 10/12/2017  1. MRA Neck Vessels: No significant carotid stenosis. Absent antegrade flow  in the left vertebral artery which appears occluded proximally. 2. 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 The anterior circulation is normal. 3. The findings were discussed with the attending neurologist at the time of interpretation. Heron Nay, MD 10/12/2017 9:44 PM    Mri Brain With / Without Contrast    Result Date: 10/12/2017   Evolving left cerebellar hemisphere infarction with mild mass effect and superficial petechial type hemorrhage. The findings were discussed with the attending neurologist following interpretation. Heron Nay, MD 10/12/2017 9:44 PM    Chest Ap Portable    Result Date: 10/12/2017   No definite acute evaluate accounting for degree of an sprain Laurena Slimmer, MD 10/12/2017 8:12 AM     Echo Results     Procedure Component Value Units Date/Time    Echocardiogram Adult Complete W Color Doppler Waveform [[id number]] Collected:  10/12/17 1659     Updated:  10/13/17 1246    Narrative:       Name:     [name]  Age:     51 years  DOB:     18-Oct-1965  Gender:     Male  MRN:     [id number]  Wt:     215 lb  BSA:     2.18 m2  HR:     91 bpm  Systolic BP:     938 mmHg  Diastolic BP:     92  mmHg  Technical Quality:     Adequate  Exam Date/Time:     10/12/2017 4:59 PM  Exam Type:     ECHOCARDIOGRAM ADULT COMPLETE W CLR/ DOPP WAVEFORM    Staff  Sonographer:     Debbora Lacrosse,  RCS  Ordering Physician:     Octavia Bruckner    Study Info  Indications       - CVA work up  Procedure    Complete two-dimensional, color flow and spectral Doppler transthoracic  echocardiogram is performed.    67 in      History/Risk Factors  Hypertension:     Yes      Summary    * Normal left ventricular cavity size with moderate  concentric left  ventricular hypertrophy.  No segmental wall motion abnormalities,  Visually  estimated LVEF 60%. .    * Normal right ventricular size with depressed RV systolic function by TAPSE  criteria. .    * The tricuspid valve is  normal in structure with mild tricuspid  insufficiency. .    * The mitral valve is normal in structure with no significant stenosis.  There is mild mitral insufficiency. .    * The aortic valve is tricuspid with no significant regurgitation or  stenosis.    * The pulmonic valve is normal in structure with mild pulmonic  insufficiency. .    * Trace posterior pericardial effusion, not hemodynamically significant. .    * No thrombi, vegetations or other abnormal intracardiac masses.      Findings  Left Ventricle    Normal left ventricular cavity size with moderate  concentric left  ventricular hypertrophy.  No segmental wall motion abnormalities,  Visually  estimated LVEF 60%. .    Normal.  Right Ventricle    Normal right ventricular size with depressed RV systolic function by TAPSE  criteria. .      Left Atrium    Left atrium is normal in size. (normal LAVI).    Right Atrium    Right atrium is normal in size.    Atrial Septum    No evidence of interatrial shunt by color Doppler.      Aortic Valve    The aortic valve is tricuspid with no significant regurgitation or stenosis.    Pulmonary Valve    The pulmonic valve is normal in structure with mild pulmonic insufficiency.  .    Mitral Valve    The mitral valve is normal in structure with no significant stenosis.  There  is mild mitral insufficiency. .    Tricuspid Valve    The tricuspid valve is normal in structure with mild tricuspid  insufficiency. .      Pulmonary Arteries    Normal estimated right ventricular systolic pressure.    Pericardium / Pleural Effusion    Trace posterior pericardial effusion, not hemodynamically significant. .    Inferior Vena Cava    The IVC is normal in size with normal respiratory variation.    Aorta    Aortic root and ascending aorta are normal in size.      Measurements  2D Measurements  ----------------------------------------------------------------------  Name                                 Value         Normal  ----------------------------------------------------------------------    Parasternal 2D  ----------------------------------------------------------------------  IVS Diastolic Thickness (2D)  1.72 cm     0.60-1.00   LVID Diastole (2D)                 4.23 cm     4.20-5.80   LVIW Diastolic Thickness  (2D)                               1.64 cm     0.60-1.00   LVID Systole (2D)                  2.97 cm     2.50-4.00   LVOT Diameter                      2.22 cm                 LA Dimension (2D)                  4.67 cm     3.00-4.10   Ao Root Diameter (2D)              3.47 cm                   Apical 2D Dimensions  ----------------------------------------------------------------------  LA Volume Index (BP A-L)       16.76 ml/m2   16.00-34.00  M-mode Measurements  ----------------------------------------------------------------------  Name                                 Value        Normal  ----------------------------------------------------------------------    M-Mode  ----------------------------------------------------------------------  TAPSE                              1.16 cm        >=1.60  LVOT/Aortic Valve Doppler Measurements  ----------------------------------------------------------------------  Name                                 Value        Normal  ----------------------------------------------------------------------    LVOT Doppler  ----------------------------------------------------------------------  LVOT Peak Velocity                0.74 m/s                   AoV Doppler  ----------------------------------------------------------------------  AV Peak Velocity                  1.14 m/s                 AV Peak Gradient                 5.24 mmHg                 AV Area (Cont Eq Velocity)        2.51 cm2  RVOT/Pulmonic Valve Doppler Measurements  ----------------------------------------------------------------------  Name                                 Value         Normal  ----------------------------------------------------------------------    PV Doppler  ----------------------------------------------------------------------  PV Peak Velocity  1.17 m/s  Mitral Valve Measurements  ----------------------------------------------------------------------  Name                                 Value        Normal  ----------------------------------------------------------------------    MV Doppler  ----------------------------------------------------------------------  MV E Peak Velocity                0.51 m/s                 MV A Peak Velocity                0.59 m/s                 MV E/A                                0.86                   MV Annular TDI  ----------------------------------------------------------------------  MV Septal e' Velocity             0.06 m/s        >=0.08   MV E/e' (Septal)                      8.47        <=8.00   MV Lateral e' Velocity            0.05 m/s        >=0.10   MV E/e' (Lateral)                    10.17        <=8.00  Tricuspid Valve Measurements  ----------------------------------------------------------------------  Name                                 Value        Normal  ----------------------------------------------------------------------    TV Regurgitation Doppler  ----------------------------------------------------------------------  TR Peak Velocity                  2.15 m/s                 TR Peak Gradient                   19 mmHg                 RA Pressure                      3.00 mmHg        <=3.00   RV Systolic Pressure            21.52 mmHg        <36.00  Aorta / Venous Measurements  ----------------------------------------------------------------------  Name                                 Value        Normal  ----------------------------------------------------------------------    IVC/SVC  ----------------------------------------------------------------------  IVC Diameter (Exp 2D)              1.88  cm        <=2.10  Report Signatures  Finalized ZO:[id number]  [place] on 10/13/2017 12:46:06 PM  Promoted by:Naa  [place] on 10/12/2017 6:20:44 PM        Discharge Medications:     Medication List      START taking these medications    acetaminophen-codeine 300-30 MG per tablet  Commonly known as:  TYLENOL #3  Take 1 tablet by mouth every 6 (six) hours as needed for Pain     amLODIPine 5 MG tablet  Commonly known as:  NORVASC  Take 1 tablet (5 mg total) by mouth daily     aspirin 81 MG chewable tablet  Chew 1 tablet (81 mg total) by mouth daily     atorvastatin 40 MG tablet  Commonly known as:  LIPITOR  Take 1 tablet (40 mg total) by mouth nightly     predniSONE 20 MG tablet  Commonly known as:  DELTASONE  Take 2 tabs daily x 3 days then 1 tab daily x 3 days        CHANGE how you take these medications    colchicine 0.6 MG tablet  Take 1 tablet (0.6 mg total) by mouth daily for 7 days  What changed:   how much to take   how to take this   when to take this   additional instructions        CONTINUE taking these medications    lisinopril 40 MG tablet  Commonly known as:  PRINIVIL,ZESTRIL     meloxicam 7.5 MG tablet  Commonly known as:  MOBIC  Take 2 tablets (15 mg total) by mouth daily     SYNJARDY 12.07-998 MG Tabs  Generic drug:  Empagliflozin-metFORMIN HCl     TOUJEO MAX SOLOSTAR 300 UNIT/ML injection pen  Generic drug:  insulin glargine     VICTOZA 18 MG/3ML injection  Generic drug:  liraglutide        STOP taking these medications    HYDROcodone-acetaminophen 5-325 MG per tablet  Commonly known as:  NORCO           Where to Get Your Medications      These medications were sent to [id number] - [place], [place] - [address]  [address], [place] [address]    Phone:  [phone]    amLODIPine 5 MG tablet   aspirin 81 MG chewable tablet   atorvastatin 40 MG tablet   colchicine 0.6 MG tablet   predniSONE 20 MG tablet     You can get these medications from any pharmacy    Bring a paper  prescription for each of these medications   acetaminophen-codeine 300-30 MG per tablet             Hospital Course   Presentation History / Hospital Course (6 Days):      [name] is a 52 y.o. male admitted with acute intractable headache. CT scan and MRI was done, Pt found to have Left cerebellar CVA, Neurology and neurosurgery were consulted and the patient was transferred to ICU for 3% NS. Patient was started on aspirin and statin. Ideally he needs to be started on plavix given intracranial stenoses. But this will need to be started outpatient in a few weeks given petechial hemorrhages seen on imaging. TTE with no acute findings. He will need TEE but this can be done outpatient. Patient has improved significantly. Plan is for him to return home to [facility] and plans to get neurologist in [place]. He also had  acute gout flare involving right 1st toe and left knee that was limiting ambulation. He improved with steroids. On discharge he was sent with steroid taper and colchicine and pain meds. Walker supplied to patient. Patient discharged home with close f/u with PCP and neurology in [place].     RECOMMENDATIONS:    - Patient was informed of abnormal and incidental imaging findings during hospitalization, and advised to review this information with their  medical provider.             Best Practices   Was the patient admitted with either a CHF Exacerbation or Pneumonia? NO     Progress Note/Physical Exam at Discharge     Subjective: Patient reported feeling well, and is ready for discharge.    Vitals:    10/18/17 0735 10/18/17 0800 10/18/17 0944 10/18/17 1000   BP:  140/89 133/81 133/81   Pulse:  96 91 88   Resp:  22 20 22    Temp: 98.5 F (36.9 C)      TempSrc: Oral      SpO2:  97%     Weight:       Height:           GEN APPEARANCE: sitting up, looks more comfortable NAD  HEENT: PERLA; EOMI; Conjunctiva Clear  NECK: Supple; No bruits  CVS: RRR, S1, S2; No M/G/R  LUNGS: CTAB; No Wheezes;  No Rhonchi: No rales  ABD: Soft; No TTP; + Normoactive BS  EXT: No edema; swelling of right foot; swelling and pain in left knee improved  Skin exam:  pink  NEURO: nystagmus. No ataxia on finger nose finger testing. Strength intact  CAP REFILL:  Normal  MENTAL STATUS:  Normal       Diagnostics     Labs/Studies Pending at Discharge:    Last Labs     Recent Labs  Lab 10/18/17  0509 10/17/17  0405 10/16/17  0426   WBC 7.97 7.35 7.91   RBC 5.25 5.33 5.35   Hgb 14.0 14.0 14.1   Hematocrit 45.6 47.6 47.9   MCV 86.9 89.3 89.5   Platelets 222 214 250         Recent Labs  Lab 10/18/17  0509 10/17/17  0405 10/16/17  0426 10/15/17  0919 10/15/17  0548 10/15/17  0149  10/14/17  0730   Sodium 137 139 141 145 147* 145 More results in Results Review 142   Potassium 4.4 3.9 3.8  --   --  4.3  --  4.5   Chloride 103 104 107  --   --  111  --  109   CO2 23 25 23   --   --  24  --  25   BUN 15.0 10.0 11.0  --   --  12.0  --  15.0   Creatinine 0.8 0.8 0.8  --   --  0.8  --  0.9   Glucose 236* 198* 206*  --   --  201*  --  217*   Calcium 8.7 8.9 9.2  --   --  9.4  --  9.0   More results in Results Review = values in this interval not displayed.    Microbiology Results     Procedure Component Value Units Date/Time    MRSA culture - Nares [[id number]] Collected:  10/13/17 1405    Specimen:  Body Fluid from Nares Updated:  10/14/17 1537     Culture MRSA Surveillance Negative for Methicillin  Resistant Staph aureus    MRSA culture - Throat [[id number]] Collected:  10/13/17 1405    Specimen:  Body Fluid from Throat Updated:  10/14/17 1537     Culture MRSA Surveillance Negative for Methicillin Resistant Staph aureus           Patient Instructions   Discharge Diet: Heart healthy, carb controlled diet  Discharge Activity: As tolerated  LABS/TESTING recommended after discharge    Follow Up Appointment:  Follow-up Information     PCP. Schedule an appointment as soon as possible for a visit in 1 week(s).           Neurology. Schedule an appointment as  soon as possible for a visit in 10 day(s).           Pcp, [facility], MD .                  Time spent examining patient, discussing with patient/family regarding hospital course, chart review, reconciling medications and discharge planning: > 35 minutes.  This patient was examined by me on 10/18/2017, the day of discharge.    Signed,  Betsey Holiday    4:43 PM 10/18/2017

## 2017-10-18 NOTE — Progress Notes (Signed)
ICU Daily Progress Note                                                                                                 [phone]    Date Time: 10/18/17 10:02 AM  Patient Name: [name] 52 y.o. male admitted with Acute nonintractable headache, unspecified headache type  Attending Physician: Betsey Holiday, MD  Room: [id number]   Admit Date: 10/12/2017  LOS: 6 days    Patient status: Inpatient  Hospital Day: 6       ICU PROBLEM LIST =      Assessment:   Acute left cerebellar infarction: Unsteady gait with blurred vision improving currently ambulating well with a frontwheel walker.  Outpatient TEE, home with PT.    Accelerated hypertension: Much improved blood pressure within normal therapeutic range.    Acute gout flare: Having diarrhea with colchicine, discontinue, continue prednisone taper.    Possible discharge today.  We will sign off.    Plan:     As detailed above.    Patient is critically ill and has a risk of fatal decompensation.  Hence needs to be monitored and treated closely  in ICU  Code Status: full code    Plan of care discussed with ICU nurse, pharmacy, nutritionist and respiratory therapy  Subjective:     Had diarrhea related to colchicine, able to ambulate, decreased pain right foot.        Medications:      Scheduled Meds: PRN Meds:      amLODIPine 5 mg Oral Daily   aspirin 81 mg Oral Daily   atorvastatin 40 mg Oral QHS   colchicine 0.6 mg Oral Daily   enoxaparin 40 mg Subcutaneous Daily   insulin lispro 1-4 Units Subcutaneous QHS   insulin lispro 1-8 Units Subcutaneous TID AC   lisinopril 40 mg Oral Daily       Continuous Infusions:     acetaminophen 650 mg Q4H PRN   acetaminophen-codeine 1 tablet Q4H PRN   alum & mag hydroxide-simethicone 30 mL Q4H PRN   butalbital-acetaminophen-caffeine 1 tablet Q6H PRN   hydrALAZINE 10 mg Q4H PRN   morphine 3 mg Q4H PRN   naloxone 0.2 mg PRN   ondansetron 4 mg Q4H PRN   Or     ondansetron 4 mg Q6H PRN   promethazine 12.5 mg Q6H PRN    senna-docusate 2 tablet Daily PRN         Review of Systems:     Right foot pain and swelling, some unsteady gait balance with ambulation.  Diarrhea with colchicine.    Physical Exam:       General appearance - no visible respiratory distress patient does not appear toxic  Mental status -   Alert and oriented x3  Eyes - EOMI No Icterus PERRL  Nose - no nasal discharge  Mouth - mucous membrane is moist.   Neck - no JVD, neck is supple, no  lymphadenopathy or thyromegaly  Chest - clear to auscultation  Heart - S1-S2 RRR no S3-S4 no murmur, sinus  Abdomen - soft nontender  bowel sounds are normal with no hepatosplenomegaly  Neurological - no motor or sensory deficit  Extremities -right foot edema, pulses 2/4, no  clubbing or cyanosis  Skin - no skin rash  Capillary refill time - <3 sec         Data:     Invasive ICU Hemodynamics:                     IBW/kg (Calculated) : 66.1    ABGs:         Invalid input(s): STATS, O2DEL, O2FLO, PRESS, VNTMN, PRSUP    Vent Settings:  IBW:      Patient Lines/Drains/Airways Status    Active PICC Line / CVC Line / PIV Line / Drain / Airway / Intraosseous Line / Epidural Line / ART Line / Line / Wound / Pressure Ulcer / NG/OG Tube     Name:   Placement date:   Placement time:   Site:   Days:    Peripheral IV 10/12/17 Right Antecubital  10/12/17    1601    Antecubital    5    Wound 10/18/17 Skin Tear Ankle Left;Lower;Outer clean & dry  10/18/17    0735    Ankle    less than 1              VITAL SIGNS   Temp:  [97.8 F (36.6 C)-98.7 F (37.1 C)] 98.5 F (36.9 C)  Heart Rate:  [82-104] 91  Resp Rate:  [13-29] 20  BP: (123-151)/(57-89) 133/81  Pulse ox:    Intake/Output Summary (Last 24 hours) at 10/18/17 1002  Last data filed at 10/18/17 0500   Gross per 24 hour   Intake             1020 ml   Output              700 ml   Net              320 ml        Labs:     CBC w/Diff CMP     Recent Labs  Lab 10/18/17  0509 10/17/17  0405 10/16/17  0426  10/12/17  0728   WBC 7.97 7.35 7.91 More  results in Results Review 6.71   Hgb 14.0 14.0 14.1 More results in Results Review 14.8   Hematocrit 45.6 47.6 47.9 More results in Results Review 49.7*   Platelets 222 214 250 More results in Results Review 271   MCV 86.9 89.3 89.5 More results in Results Review 89.2   Neutrophils  --   --   --   --  52.5   More results in Results Review = values in this interval not displayed.    PT/INR           Recent Labs  Lab 10/18/17  0509 10/17/17  0405 10/16/17  0426  10/12/17  0728   Sodium 137 139 141 More results in Results Review 138   Potassium 4.4 3.9 3.8 More results in Results Review 4.8   Chloride 103 104 107 More results in Results Review 101   CO2 23 25 23  More results in Results Review 24   BUN 15.0 10.0 11.0 More results in Results Review 28.0   Creatinine 0.8 0.8 0.8 More results in Results Review 1.2   Glucose 236* 198* 206* More results in Results Review 361*   Calcium 8.7 8.9 9.2 More results in Results Review 10.0  Protein, Total  --   --   --   --  7.8   Albumin  --   --   --   --  3.9   AST (SGOT)  --   --   --   --  15   ALT  --   --   --   --  17   Alkaline Phosphatase  --   --   --   --  78   Bilirubin, Total  --   --   --   --  0.5   More results in Results Review = values in this interval not displayed.   Glucose POCT     Recent Labs  Lab 10/18/17  0509 10/17/17  0405 10/16/17  0426 10/15/17  0149 10/14/17  0730 10/14/17  0202 10/13/17  2058   Glucose 236* 198* 206* 201* 217* 201* 253*          Recent Labs  Lab 10/12/17  0728   Troponin I <0.01       Urinalysis    Recent Labs  Lab 10/12/17  0728   Urine Type Urine, Clean Ca   Color, UA Straw   Clarity, UA Clear   Specific Gravity UA 1.026   Urine pH 6.0   Nitrite, UA Negative   Ketones UA Negative   Urobilinogen, UA Negative   Bilirubin, UA Negative   Blood, UA Negative   RBC, UA 0 - 2   WBC, UA 0 - 5       Rads:   No results found.        I have personally reviewed the patient's history and 24 hour interval events, along with vitals, labs,  radiology images .     So far today I have spent 25 minutes providing care for this patient excluding teaching and billable procedures, and not overlapping with any other providers.:     Margarite Gouge, MD   10/18/17 10:02 AM

## 2017-10-18 NOTE — Progress Notes (Signed)
[employer]:  Name: [employer] [facility] [employer]     Phone #:[phone]  Equipment Ordered:  Rolling Walker delivered to patient's hospital room.    The above services were set up by:   Armina Galloway  [place] DME Coordinator         Ext:[phone]

## 2017-10-18 NOTE — Discharge Instructions (Signed)
Discharge Instructions for Stroke  You have been diagnosed with or have a high risk for a stroke, ora TIA (transient ischemic attack).During a stroke, blood stops flowing to part of your brain. This can damage areas in the brain that control other parts of the body. Symptoms after a stroke depend on which part of the brain has been affected.  Stroke risk factors  Once you've had a stroke, you're at greater risk for another one. Listed below are some other factors that can increase your risk for a stroke:   High blood pressure   High cholesterol   Cigarette or cigar smoking   Diabetes   Carotid or other artery disease   Atrial fibrillation, atrial flutter,or other heart disease   Not being physically active   Obesity   Certain blood disorders such as sickle cell anemia   Drinking too much alcohol   Abusing street drugs   Race   Gender   Family history of stroke   Diet high in salty, fried, or greasy foods  Changes in daily living  Doingyour regular tasks may be difficult after you've had a stroke, but you can learn new ways to manage your daily activities. In fact, doing daily activities may help you to regain muscle strength. This can also help your affected arm or leg work more normally. Be patient, give yourself time to adjust, and appreciate the progress you make.  Daily activities  You may be at risk of falling. Make changes to your home to help you walk more easily. A therapist will decide if you need an assistive device to walk safely.  You may need to see an occupational therapist or physical therapist to learn new ways of doing things. For example, you may need to make adjustments when bathing or dressing:  Tips for showering or bathing   Test the water temperature with a hand or foot that was not affected by the stroke.   Use grab bars, a shower seat, a hand-held showerhead, and a long-handled brush.  Tips for getting dressed   Dress while sitting, starting with the affected side or  limb.   Wear shirts that pull easily over your head. Wear pants or skirts with elastic waistbands.   Use zippers with loops attached to the pull tabs.  Lifestyle changes   Take your medicines exactly as directed. Don't skip doses.   Begin an exercise program. Ask your provider how to get started. Also ask how much activity you should try to get on a daily or weekly basis. You can benefit from simple activities such as walking or gardening.   Limit how much alcohol you drink. Men should haveno more than 2 alcoholic drinks a day. Women should limit themselves to 1 alcoholic drink per day.   Know your cholesterol level. Follow your provider's recommendations about how to keep cholesterol under control.   If you are a smoker, quit now. Join a stop-smoking program to improve your chances of success. Ask your provider about medicines or other methods to help you quit.   Learn stress management techniques to help you deal with stress in your home and work life.  Diet  Your healthcare provider will give you information on changes you may need to make to your diet, based on your situation. Your provider may recommend that you see a registered dietitian for help with diet changes. Changes may include:   Reducing the amount of fat and cholesterol you eat   Reducing the amount   of salt (sodium) in your diet, especially if you have high blood pressure   Eating more fresh vegetables and fruits   Eating more lean proteins, such as fish, poultry, and beans and peas (legumes)   Eating less red meat and processed meats   Using low-fat dairy products   Limiting vegetable oils and nut oils   Limiting sweets and processed foods such as chips, cookies, and baked goods   Not eating trans fats. These are often found in processed foods. Don't eat any food that has hydrogenated listed in its ingredients.  Follow-up care   Keep your medical appointments. Close follow-up is important to stroke rehabilitation and  recovery.   Some medicines require blood tests to check for progress or problems. Keep follow-up appointments for any blood tests ordered by your providers.  Call 911  Call 911 right awayif you have any of the following symptoms of stroke:   Weakness, tingling, or loss of feeling on one side of your face or body   Sudden double vision or trouble seeing in one or both eyes   Sudden trouble talking or slurred speech   Trouble understanding others   Sudden, severe headache   Dizziness, loss of balance, or a sense of falling   Blackouts or seizures    F.A.S.T. is an easy way to remember the signs of stroke. When you see these signs, you know that you need to call 911 fast.  F.A.S.T. stands for:   F is for face drooping. One side of the face is drooping or numb. When the person smiles, the smile is uneven.   A is for arm weakness. One arm is weak or numb. When the person lifts both arms at the same time, one arm may drift downward.   S is for speech difficulty. You may notice slurred speech or trouble speaking. The person can't repeat a simple sentence correctly when asked.   T is for time to call 911. If someone shows any of these symptoms, even if they go away, call 911 right away. Make note of the time the symptoms first appeared.   Date Last Reviewed: 01/28/2016   2000-2019 [employer] [address], [place] [address]. All rights reserved. This information is not intended as a substitute for professional medical care. Always follow your healthcare professional's instructions.

## 2017-10-18 NOTE — Discharge Summary -  Nursing (Signed)
D/c instruction is given to his wife and patient. Review of his medications, S&S of hypotension, stroke, treatment plan at d/c, provide a pain medication prior to patient leave the hospital. Obtain a note from dr. Greggory Stallion for his work.  Aliene Altes

## 2017-10-18 NOTE — Plan of Care (Signed)
Problem: Pain  Goal: Pain at adequate level as identified by patient  Outcome: Progressing   10/18/17 0147   Goal/Interventions addressed this shift   Pain at adequate level as identified by patient Identify patient comfort function goal;Assess for risk of opioid induced respiratory depression, including snoring/sleep apnea. Alert healthcare team of risk factors identified.;Reassess pain within 30-60 minutes of any procedure/intervention, per Pain Assessment, Intervention, Reassessment (AIR) Cycle;Evaluate if patient comfort function goal is met;Evaluate patient's satisfaction with pain management progress;Offer non-pharmacological pain management interventions       Problem: Neurological Deficit  Goal: Neurological status is stable or improving  Outcome: Progressing      Problem: Every Day - Stroke  Goal: Stable vital signs and fluid balance  Outcome: Progressing   10/18/17 0147   Goal/Interventions addressed this shift   Stable vital signs and fluid balance  Position patient for maximum circulation/cardiac output;Monitor and assess vitals every 4 hours or as ordered and hemodynamic parameters;Monitor intake and output. Notify LIP if urine output is < 30 mL/hour.;Encourage oral fluid intake;Apply telemetry monitor as ordered     Goal: Patient will maintain adequate oxygenation  Outcome: Progressing   10/18/17 0147   Goal/Interventions addressed this shift   Patient will maintain adequate oxygenation  Maintain SpO2 of greater than 92%     Goal: Neurological status is stable or improving  Outcome: Progressing      Comments: Patient remains on cardiac monitor- monitor shows sinus rhythm with no ectopy noted.  BP stable. PPP. Skin warm and dry. Patient remains on room air. O2 sats > 92 %.  Neurological assessment done q4h. Patient alert and oriented x 4 spheres, Pupils 3 mm brisk. Left pupil  Nystagmus present.  See neurological assessment in complex assessment.  Patient complaining of gout pain in right and left big  toes and left inner knee. Pain level 8 out of 10  At beginning of shift- patient received po tylenol # 3 at 1808 hours. IV morphine 3 mg ordered and given.  Pain level  8 out of 10  At 2124 hours , po tylenol # 3  Given at 2235 hours and by 2330 hours pain 3 out of 10.  Patient complained  Of indigestion after po tylenol # 3 was given - patient given diet coke to drink and patient fell asleep.  Blood sugar finger sticks remain  Elevated - covered with SSI.

## 2017-10-19 ENCOUNTER — Telehealth: Payer: Self-pay

## 2017-10-19 NOTE — UM Notes (Signed)
10/18/17 Discharge Summary      DISCHARGE SUMMARY    Discharge Information  Admission Diagnosis:   Acute nonintractable headache, unspecified headache type    Discharge Diagnosis:   Acute CVA of L cerebellar hemisphere   Intractable headache, improved   Gait instability with dizziness, improved   Acute gout flare  HN  DM2     Admission Condition: Guarded  Discharge Condition: Stable  Consultants: neurology, NSG  Functional Status: ad lib   Discharged to: home with [facility]    Imaging:     Ct Head Wo Contrast    Result Date: 10/14/2017   Evolving left cerebellar stroke with petechial hemorrhage, similar to the prior MRI of the brain. Joselyn Glassman, MD 10/14/2017 3:08 PM    Ct Head Without Contrast    Result Date: 10/12/2017   Extensive hypodensity through the left cerebellar hemisphere most likely age-indeterminate ischemic change. Note: Note that CT scanning at this site  utilizes multiple dose reduction techniques including automatic exposure control, adjustment of the MAA and/or KVP according to patient's size and use of iterative reconstruction technique Laurena Slimmer, MD 10/12/2017 8:21 AM    Mra Head (intracranial) Without Contrast    Result Date: 10/12/2017  1. MRA Neck Vessels: No significant carotid stenosis. Absent antegrade flow in the left vertebral artery which appears occluded proximally. 2. 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 The anterior circulation is normal. 3. The findings were discussed with the attending neurologist at the time of interpretation. Heron Nay, MD 10/12/2017 9:44 PM    Mra Neck With / Without Contrast    Result Date: 10/12/2017  1. MRA Neck Vessels: No significant carotid stenosis. Absent antegrade flow in the left vertebral artery which appears occluded proximally. 2. 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 The anterior circulation is normal. 3. The findings  were discussed with the attending neurologist at the time of interpretation. Heron Nay, MD 10/12/2017 9:44 PM    Mri Brain With / Without Contrast    Result Date: 10/12/2017   Evolving left cerebellar hemisphere infarction with mild mass effect and superficial petechial type hemorrhage. The findings were discussed with the attending neurologist following interpretation. Heron Nay, MD 10/12/2017 9:44 PM    Chest Ap Portable    Result Date: 10/12/2017   No definite acute evaluate accounting for degree of an sprain Laurena Slimmer, MD 10/12/2017 8:12 AM    Hospital Course  Presentation History / Hospital Course (6 Days):      [name] a 52 y.o.maleadmitted with acute intractable headache. CT scan and MRI was done, Pt found to have Left cerebellar CVA, Neurology and neurosurgery were consulted and the patient was transferred to ICU for 3% NS. Patient was started on aspirin and statin. Ideally he needs to be started on plavix given intracranial stenoses. But this will need to be started outpatient in a few weeks given petechial hemorrhages seen on imaging. TTE with no acute findings. He will need TEE but this can be done outpatient. Patient has improved significantly. Plan is for him to return home to [facility] and plans to get neurologist in [place]. He also had acute gout flare involving right 1st toe and left knee that was limiting ambulation. He improved with steroids. On discharge he was sent with steroid taper and colchicine and pain meds. Walker supplied to patient. Patient discharged home with close f/u  with PCP and neurology in [place].     RECOMMENDATIONS:    - Patient was informed of abnormal and incidental imaging findings during hospitalization, and advised to review this information with their  medical provider.    Laury Axon BSN, RN  Utilization Review  [facility]   [phone]

## 2018-12-05 DIAGNOSIS — M79675 Pain in left toe(s): Secondary | ICD-10-CM | POA: Diagnosis not present

## 2018-12-05 DIAGNOSIS — S00212A Abrasion of left eyelid and periocular area, initial encounter: Secondary | ICD-10-CM | POA: Diagnosis not present

## 2018-12-24 DIAGNOSIS — Z95818 Presence of other cardiac implants and grafts: Secondary | ICD-10-CM | POA: Diagnosis not present

## 2019-01-20 DIAGNOSIS — M25561 Pain in right knee: Secondary | ICD-10-CM | POA: Diagnosis not present

## 2019-01-20 DIAGNOSIS — M109 Gout, unspecified: Secondary | ICD-10-CM | POA: Diagnosis not present

## 2019-02-20 DIAGNOSIS — N529 Male erectile dysfunction, unspecified: Secondary | ICD-10-CM | POA: Diagnosis not present

## 2019-02-28 DIAGNOSIS — E1142 Type 2 diabetes mellitus with diabetic polyneuropathy: Secondary | ICD-10-CM | POA: Diagnosis not present

## 2019-02-28 DIAGNOSIS — E119 Type 2 diabetes mellitus without complications: Secondary | ICD-10-CM | POA: Diagnosis not present

## 2019-02-28 DIAGNOSIS — I10 Essential (primary) hypertension: Secondary | ICD-10-CM | POA: Diagnosis not present

## 2019-02-28 DIAGNOSIS — E785 Hyperlipidemia, unspecified: Secondary | ICD-10-CM | POA: Diagnosis not present

## 2019-03-02 DIAGNOSIS — Z95818 Presence of other cardiac implants and grafts: Secondary | ICD-10-CM | POA: Diagnosis not present

## 2019-03-08 DIAGNOSIS — I10 Essential (primary) hypertension: Secondary | ICD-10-CM | POA: Diagnosis not present

## 2019-03-08 DIAGNOSIS — M109 Gout, unspecified: Secondary | ICD-10-CM | POA: Diagnosis not present

## 2019-03-08 DIAGNOSIS — M25562 Pain in left knee: Secondary | ICD-10-CM | POA: Diagnosis not present

## 2019-03-08 DIAGNOSIS — E119 Type 2 diabetes mellitus without complications: Secondary | ICD-10-CM | POA: Diagnosis not present

## 2019-03-08 DIAGNOSIS — Z791 Long term (current) use of non-steroidal anti-inflammatories (NSAID): Secondary | ICD-10-CM | POA: Diagnosis not present

## 2019-03-08 DIAGNOSIS — Z79899 Other long term (current) drug therapy: Secondary | ICD-10-CM | POA: Diagnosis not present

## 2019-03-08 DIAGNOSIS — Z8673 Personal history of transient ischemic attack (TIA), and cerebral infarction without residual deficits: Secondary | ICD-10-CM | POA: Diagnosis not present

## 2019-03-08 DIAGNOSIS — Z7952 Long term (current) use of systemic steroids: Secondary | ICD-10-CM | POA: Diagnosis not present

## 2019-03-08 DIAGNOSIS — Z7982 Long term (current) use of aspirin: Secondary | ICD-10-CM | POA: Diagnosis not present

## 2019-03-13 DIAGNOSIS — M109 Gout, unspecified: Secondary | ICD-10-CM | POA: Diagnosis not present

## 2019-03-13 DIAGNOSIS — M25562 Pain in left knee: Secondary | ICD-10-CM | POA: Diagnosis not present

## 2019-04-11 DIAGNOSIS — Z95818 Presence of other cardiac implants and grafts: Secondary | ICD-10-CM | POA: Diagnosis not present

## 2019-04-26 DIAGNOSIS — I471 Supraventricular tachycardia: Secondary | ICD-10-CM | POA: Diagnosis not present

## 2019-04-26 DIAGNOSIS — R9431 Abnormal electrocardiogram [ECG] [EKG]: Secondary | ICD-10-CM | POA: Diagnosis not present

## 2019-04-26 DIAGNOSIS — E119 Type 2 diabetes mellitus without complications: Secondary | ICD-10-CM | POA: Diagnosis not present

## 2019-04-26 DIAGNOSIS — I119 Hypertensive heart disease without heart failure: Secondary | ICD-10-CM | POA: Diagnosis not present

## 2019-04-26 DIAGNOSIS — I63341 Cerebral infarction due to thrombosis of right cerebellar artery: Secondary | ICD-10-CM | POA: Diagnosis not present

## 2019-04-26 DIAGNOSIS — I10 Essential (primary) hypertension: Secondary | ICD-10-CM | POA: Diagnosis not present

## 2019-05-10 DIAGNOSIS — I639 Cerebral infarction, unspecified: Secondary | ICD-10-CM | POA: Diagnosis not present

## 2019-05-21 DIAGNOSIS — Z95818 Presence of other cardiac implants and grafts: Secondary | ICD-10-CM | POA: Diagnosis not present

## 2019-05-24 DIAGNOSIS — I10 Essential (primary) hypertension: Secondary | ICD-10-CM | POA: Diagnosis not present

## 2019-05-24 DIAGNOSIS — M79672 Pain in left foot: Secondary | ICD-10-CM | POA: Diagnosis not present

## 2019-05-24 DIAGNOSIS — M79675 Pain in left toe(s): Secondary | ICD-10-CM | POA: Diagnosis not present

## 2019-05-24 DIAGNOSIS — M109 Gout, unspecified: Secondary | ICD-10-CM | POA: Diagnosis not present

## 2019-06-12 DIAGNOSIS — E1142 Type 2 diabetes mellitus with diabetic polyneuropathy: Secondary | ICD-10-CM | POA: Diagnosis not present

## 2019-06-12 DIAGNOSIS — E119 Type 2 diabetes mellitus without complications: Secondary | ICD-10-CM | POA: Diagnosis not present

## 2019-06-12 DIAGNOSIS — Z794 Long term (current) use of insulin: Secondary | ICD-10-CM | POA: Diagnosis not present

## 2019-06-12 DIAGNOSIS — M109 Gout, unspecified: Secondary | ICD-10-CM | POA: Diagnosis not present

## 2019-06-12 DIAGNOSIS — Z79899 Other long term (current) drug therapy: Secondary | ICD-10-CM | POA: Diagnosis not present

## 2019-06-12 DIAGNOSIS — E785 Hyperlipidemia, unspecified: Secondary | ICD-10-CM | POA: Diagnosis not present

## 2019-06-12 DIAGNOSIS — I10 Essential (primary) hypertension: Secondary | ICD-10-CM | POA: Diagnosis not present

## 2019-06-21 DIAGNOSIS — Z95818 Presence of other cardiac implants and grafts: Secondary | ICD-10-CM | POA: Diagnosis not present

## 2019-07-22 DIAGNOSIS — Z95818 Presence of other cardiac implants and grafts: Secondary | ICD-10-CM | POA: Diagnosis not present

## 2019-07-24 DIAGNOSIS — Z4509 Encounter for adjustment and management of other cardiac device: Secondary | ICD-10-CM | POA: Diagnosis not present

## 2019-07-24 DIAGNOSIS — Z20822 Contact with and (suspected) exposure to covid-19: Secondary | ICD-10-CM | POA: Diagnosis not present

## 2019-10-16 ENCOUNTER — Telehealth: Payer: Self-pay | Admitting: Surgery

## 2019-10-16 ENCOUNTER — Emergency Department (HOSPITAL_BASED_OUTPATIENT_CLINIC_OR_DEPARTMENT_OTHER): Payer: Self-pay

## 2019-10-16 ENCOUNTER — Emergency Department (HOSPITAL_COMMUNITY)
Admission: EM | Admit: 2019-10-16 | Discharge: 2019-10-16 | Disposition: A | Discharge status: Home / Self Care | Payer: Self-pay | Attending: Emergency Medicine | Admitting: Emergency Medicine

## 2019-10-16 ENCOUNTER — Encounter (HOSPITAL_COMMUNITY): Payer: Self-pay

## 2019-10-16 DIAGNOSIS — R600 Localized edema: Secondary | ICD-10-CM | POA: Insufficient documentation

## 2019-10-16 DIAGNOSIS — Z79899 Other long term (current) drug therapy: Secondary | ICD-10-CM | POA: Insufficient documentation

## 2019-10-16 DIAGNOSIS — M7989 Other specified soft tissue disorders: Secondary | ICD-10-CM

## 2019-10-16 DIAGNOSIS — M109 Gout, unspecified: Secondary | ICD-10-CM

## 2019-10-16 DIAGNOSIS — R609 Edema, unspecified: Secondary | ICD-10-CM

## 2019-10-16 DIAGNOSIS — E119 Type 2 diabetes mellitus without complications: Secondary | ICD-10-CM | POA: Insufficient documentation

## 2019-10-16 DIAGNOSIS — M10072 Idiopathic gout, left ankle and foot: Secondary | ICD-10-CM | POA: Insufficient documentation

## 2019-10-16 DIAGNOSIS — I10 Essential (primary) hypertension: Secondary | ICD-10-CM | POA: Insufficient documentation

## 2019-10-16 HISTORY — DX: Type 2 diabetes mellitus without complications: E11.9

## 2019-10-16 HISTORY — DX: Essential (primary) hypertension: I10

## 2019-10-16 HISTORY — DX: Gout, unspecified: M10.9

## 2019-10-16 MED ORDER — DEXAMETHASONE SODIUM PHOSPHATE 10 MG/ML IJ SOLN
10.0000 mg | Freq: Once | INTRAMUSCULAR | Status: AC
  Administered 2019-10-16: 10 mg via INTRAMUSCULAR
  Filled 2019-10-16: qty 1

## 2019-10-16 MED ORDER — PREDNISONE 20 MG PO TABS: 40.0000 mg | ORAL_TABLET | Freq: Every day | ORAL | 0 refills | Status: AC

## 2019-10-16 NOTE — Progress Notes (Signed)
Lower extremity venous has been completed.   Preliminary results in CV Proc.   Blanch Media 10/16/2019 12:34 PM

## 2019-10-16 NOTE — ED Triage Notes (Signed)
Pt reports gout flare up in both his feet for the past 2 weeks, pt just moved to town and states he is still trying to find an endocrinologist to manage his gout and diabetes.

## 2019-10-16 NOTE — Discharge Instructions (Signed)
Take prednisone as needed for gout flare and pain. Continue to watch her blood sugars closely to ensure no significant change. Follow-up with an endocrinologist as able for further management of your gout and diabetes. There is information for primary care office as a below if you also need to establish with a primary care doctor. Your ultrasound was negative for blood clot today.  Continue to treat your swelling symptomatically, using Tylenol or ibuprofen as needed for pain.  You may try compression socks to help with swelling.  I recommend you follow-up with a primary care doctor for further evaluation of your leg swelling. Return to the emergency room if you develop fevers, difficulty breathing, chest pain, redness traveling up your leg, or with any new or worsening, or concerning symptoms.

## 2019-10-16 NOTE — ED Provider Notes (Signed)
[facility] EMERGENCY DEPARTMENT Provider Note   CSN: [id number] Arrival date & time: 10/16/19  7124     History Chief Complaint  Patient presents with  . Gout    Lawrence Maynard is a 54 y.o. male presenting for evaluation of bilateral toe pain.  Patient states the past 2 weeks, he has had constant bilateral toe pain.  Pain is localized to the MTP.  He states this is consistent with his normal gout flares.  Last flare was approximately 5 or 6 months ago.  He recently moved from [place], this is not established with a primary care doctor or an endocrinologist.  He denies trauma or injury.  He denies numbness or tingling.  He states prednisone normally helps with his gout flares.  He does have a history of diabetes, but this is well controlled and he does not have any significant issues when taking prednisone.  He has not taken anything for his symptoms.  Additionally, patient reports left leg swelling which has been present for the past 3 to 4 months.  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  Pain is worse with ambulation, and by the end of his shift he can see his sock indent.  HPI     Past Medical History:  Diagnosis Date  . Diabetes mellitus without complication (HCC)   . Gout   . Hypertension     There are no problems to display for this patient.   History reviewed. No pertinent surgical history.     No family history on file.  Social History   Tobacco Use  . Smoking status: Not on file  Substance Use Topics  . Alcohol use: Not on file  . Drug use: Not on file    Home Medications Prior to Admission medications   Medication Sig Start Date End Date Taking? Authorizing Provider  predniSONE (DELTASONE) 20 MG tablet Take 2 tablets (40 mg total) by mouth daily for 4 days. 10/17/19 10/21/19  Jenney Brester, PA-C    Allergies    Patient has no allergy information on record.  Review of Systems   Review of Systems  Cardiovascular: Positive for leg  swelling.  Musculoskeletal: Positive for arthralgias.  All other systems reviewed and are negative.   Physical Exam Updated Vital Signs BP 133/76 (BP Location: Left Arm)   Pulse 62   Temp 98.2 F (36.8 C) (Oral)   Resp 20   Ht 5\' 7"  (1.702 m)   Wt 86.2 kg   SpO2 98%   BMI 29.76 kg/m   Physical Exam Vitals and nursing note reviewed.  Constitutional:      General: He is not in acute distress.    Appearance: He is well-developed.     Comments: Sitting comfortably in the chair no acute distress  HENT:     Head: Normocephalic and atraumatic.  Eyes:     Conjunctiva/sclera: Conjunctivae normal.     Pupils: Pupils are equal, round, and reactive to light.  Cardiovascular:     Rate and Rhythm: Normal rate and regular rhythm.     Pulses: Normal pulses.  Pulmonary:     Effort: Pulmonary effort is normal. No respiratory distress.     Breath sounds: Normal breath sounds. No wheezing.  Abdominal:     General: There is no distension.     Palpations: Abdomen is soft.  Musculoskeletal:     Cervical back: Normal range of motion and neck supple.     Left lower leg: Edema present.  Comments: Swelling of the left leg, 1+ pitting edema.  Pedal pulses 2+ bilaterally.  Good distal sensation and cap refill.  Tenderness palpation of the bilateral MTPs with mild erythema.  No tenderness palpation of the distal great toe.  Tenderness palpation of the left calf, no erythema, warmth, induration, or streaking.  Ambulatory without difficulty  Skin:    General: Skin is warm and dry.     Capillary Refill: Capillary refill takes less than 2 seconds.  Neurological:     Mental Status: He is alert and oriented to person, place, and time.     ED Results / Procedures / Treatments   Labs (all labs ordered are listed, but only abnormal results are displayed) Labs Reviewed - No data to display  EKG None  Radiology VAS [place] LOWER EXTREMITY VENOUS (DVT) ([facility] and [facility] 7a-7p)  Result Date: 10/16/2019   Lower Venous DVTStudy Indications: Edema.  Comparison Study: no prior Performing Technologist: Blanch Media RVS  Examination Guidelines: A complete evaluation includes B-mode imaging, spectral Doppler, color Doppler, and power Doppler as needed of all accessible portions of each vessel. Bilateral testing is considered an integral part of a complete examination. Limited examinations for reoccurring indications may be performed as noted. The reflux portion of the exam is performed with the patient in reverse Trendelenburg.  +-----+---------------+---------+-----------+----------+--------------+ RIGHTCompressibilityPhasicitySpontaneityPropertiesThrombus Aging +-----+---------------+---------+-----------+----------+--------------+ CFV  Full           Yes      Yes                                 +-----+---------------+---------+-----------+----------+--------------+   +---------+---------------+---------+-----------+----------+--------------+ LEFT     CompressibilityPhasicitySpontaneityPropertiesThrombus Aging +---------+---------------+---------+-----------+----------+--------------+ CFV      Full           Yes      Yes                                 +---------+---------------+---------+-----------+----------+--------------+ SFJ      Full                                                        +---------+---------------+---------+-----------+----------+--------------+ FV Prox  Full                                                        +---------+---------------+---------+-----------+----------+--------------+ FV Mid   Full                                                        +---------+---------------+---------+-----------+----------+--------------+ FV DistalFull                                                        +---------+---------------+---------+-----------+----------+--------------+ PFV  Full                                                         +---------+---------------+---------+-----------+----------+--------------+ POP      Full           Yes      Yes                                 +---------+---------------+---------+-----------+----------+--------------+ PTV      Full                                                        +---------+---------------+---------+-----------+----------+--------------+ PERO     Full                                                        +---------+---------------+---------+-----------+----------+--------------+     Summary: RIGHT: - No evidence of common femoral vein obstruction.  LEFT: - There is no evidence of deep vein thrombosis in the lower extremity.  - No cystic structure found in the popliteal fossa.  *See table(s) above for measurements and observations.    Preliminary     Procedures Procedures (including critical care time)  Medications Ordered in ED Medications  dexamethasone (DECADRON) injection 10 mg (10 mg Intramuscular Given 10/16/19 1303)    ED Course  I have reviewed the triage vital signs and the nursing notes.  Pertinent labs & imaging results that were available during my care of the patient were reviewed by me and considered in my medical decision making (see chart for details).    MDM Rules/Calculators/A&P                          Patient presenting for evaluation of bilateral great toe pain.  On exam, patient is nontoxic.  Exam is not consistent with bacterial infection or trauma.  Patient reports this is consistent with his normal gout flare, as such we will treat with prednisone.  Additionally, consider arthritis such as RA due to bilateral nature.  Less likely septic joint or osteo-.  As patient has left leg swelling, will obtain ultrasound.  Ultrasound negative for DVT.  Exam is not consistent with cellulitis.  Consider claudication due to intermittent pain with ambulation.  Will have patient follow-up closely with PCP.  Social work consult placed  and patient given information about PCP follow-up.  At this time, patient appears safe for discharge.  Return precautions given.  Patient states he understands and agrees to plan.  Final Clinical Impression(s) / ED Diagnoses Final diagnoses:  Acute gout involving toe, unspecified cause, unspecified laterality  Left leg swelling    Rx / DC Orders ED Discharge Orders         Ordered    predniSONE (DELTASONE) 20 MG tablet  Daily     Discontinue  Reprint     10/16/19 1235  Alveria Apley, PA-C 10/16/19 1303    Geoffery Lyons, MD 10/16/19 [phone]

## 2019-10-16 NOTE — Telephone Encounter (Signed)
ED CM attempted to contact patient to assist with arranging primary care follow up unable to leave VM. Will make second attempt tomorrow

## 2019-10-22 ENCOUNTER — Other Ambulatory Visit: Payer: Self-pay

## 2019-10-22 ENCOUNTER — Emergency Department (HOSPITAL_COMMUNITY)
Admission: EM | Admit: 2019-10-22 | Discharge: 2019-10-23 | Disposition: A | Discharge status: Home / Self Care | Payer: Self-pay | Attending: Emergency Medicine | Admitting: Emergency Medicine

## 2019-10-22 DIAGNOSIS — M79672 Pain in left foot: Secondary | ICD-10-CM | POA: Insufficient documentation

## 2019-10-22 DIAGNOSIS — Z5321 Procedure and treatment not carried out due to patient leaving prior to being seen by health care provider: Secondary | ICD-10-CM | POA: Insufficient documentation

## 2019-10-22 DIAGNOSIS — M79671 Pain in right foot: Secondary | ICD-10-CM | POA: Insufficient documentation

## 2019-10-23 ENCOUNTER — Other Ambulatory Visit: Payer: Self-pay

## 2019-10-23 ENCOUNTER — Encounter (HOSPITAL_COMMUNITY): Payer: Self-pay | Admitting: Emergency Medicine

## 2019-10-23 NOTE — ED Triage Notes (Signed)
Pt c/o bilateral great toe pain that worsened tonight. Pt used topical creams with no relief. Hx gout and nerve pain.

## 2019-10-23 NOTE — ED Notes (Signed)
Pt told this NT he was leaving 

## 2019-10-31 ENCOUNTER — Emergency Department (HOSPITAL_COMMUNITY): Admission: EM | Admit: 2019-10-31 | Discharge: 2019-10-31 | Discharge status: Against Medical Advice | Payer: Self-pay

## 2019-10-31 ENCOUNTER — Emergency Department (HOSPITAL_COMMUNITY)
Admission: EM | Admit: 2019-10-31 | Discharge: 2019-10-31 | Disposition: A | Discharge status: Home / Self Care | Payer: Self-pay | Attending: Emergency Medicine | Admitting: Emergency Medicine

## 2019-10-31 ENCOUNTER — Encounter (HOSPITAL_COMMUNITY): Payer: Self-pay | Admitting: Emergency Medicine

## 2019-10-31 DIAGNOSIS — M10061 Idiopathic gout, right knee: Secondary | ICD-10-CM | POA: Insufficient documentation

## 2019-10-31 DIAGNOSIS — M10071 Idiopathic gout, right ankle and foot: Secondary | ICD-10-CM | POA: Insufficient documentation

## 2019-10-31 DIAGNOSIS — Z5321 Procedure and treatment not carried out due to patient leaving prior to being seen by health care provider: Secondary | ICD-10-CM | POA: Insufficient documentation

## 2019-10-31 NOTE — ED Notes (Signed)
Pt notified registration that pt was going to [employer]. Pt witness leaving by registration.

## 2019-10-31 NOTE — ED Triage Notes (Signed)
Pt reports gout to R great toe and R knee, came in last week but the wait was so long he LWBS.

## 2019-11-02 ENCOUNTER — Telehealth: Payer: Self-pay | Admitting: *Deleted

## 2019-11-02 ENCOUNTER — Encounter: Payer: Self-pay | Admitting: Internal Medicine

## 2019-11-02 NOTE — Telephone Encounter (Signed)
Call to patient stated unable to keep appointment due to no transportation and does not have the $25.  Patient will call when he can get transportation and the $25.  Angelina Ok, RN 11/02/2019 9:43 AM.

## 2019-11-12 ENCOUNTER — Inpatient Hospital Stay: Payer: Self-pay | Admitting: Family Medicine

## 2019-12-03 DIAGNOSIS — M791 Myalgia, unspecified site: Secondary | ICD-10-CM | POA: Diagnosis not present

## 2020-01-21 ENCOUNTER — Telehealth: Payer: Self-pay

## 2020-01-21 NOTE — Telephone Encounter (Signed)
Copied from CRM [id number]. Topic: General - Other >> Jan 09, 2020  9:14 AM Leafy Ro wrote: Reason for CRM: Pt is calling and would like to apply for orange card. Pt partial denture has broken and he would like orange card

## 2020-01-22 ENCOUNTER — Telehealth: Payer: Self-pay | Admitting: General Practice

## 2020-01-22 NOTE — Telephone Encounter (Signed)
I return Pt call, LVM inform him that he need to be establish  care first then apply for any of the [facility] program

## 2020-01-31 DIAGNOSIS — M5116 Intervertebral disc disorders with radiculopathy, lumbar region: Secondary | ICD-10-CM | POA: Diagnosis not present

## 2020-01-31 DIAGNOSIS — M25572 Pain in left ankle and joints of left foot: Secondary | ICD-10-CM | POA: Diagnosis not present

## 2020-01-31 DIAGNOSIS — M9903 Segmental and somatic dysfunction of lumbar region: Secondary | ICD-10-CM | POA: Diagnosis not present

## 2020-01-31 DIAGNOSIS — M9906 Segmental and somatic dysfunction of lower extremity: Secondary | ICD-10-CM | POA: Diagnosis not present

## 2020-03-04 DIAGNOSIS — E119 Type 2 diabetes mellitus without complications: Secondary | ICD-10-CM | POA: Diagnosis not present

## 2020-03-04 DIAGNOSIS — M109 Gout, unspecified: Secondary | ICD-10-CM | POA: Diagnosis not present

## 2020-03-04 DIAGNOSIS — Z794 Long term (current) use of insulin: Secondary | ICD-10-CM | POA: Diagnosis not present

## 2020-03-04 DIAGNOSIS — I10 Essential (primary) hypertension: Secondary | ICD-10-CM | POA: Diagnosis not present

## 2020-03-04 DIAGNOSIS — Z79899 Other long term (current) drug therapy: Secondary | ICD-10-CM | POA: Diagnosis not present

## 2020-04-04 ENCOUNTER — Other Ambulatory Visit: Payer: Self-pay

## 2020-04-04 ENCOUNTER — Emergency Department (HOSPITAL_COMMUNITY)
Admission: EM | Admit: 2020-04-04 | Discharge: 2020-04-04 | Disposition: A | Discharge status: Home / Self Care | Payer: BC Managed Care – PPO | Attending: Emergency Medicine | Admitting: Emergency Medicine

## 2020-04-04 ENCOUNTER — Encounter (HOSPITAL_COMMUNITY): Payer: Self-pay | Admitting: Pharmacy Technician

## 2020-04-04 DIAGNOSIS — M109 Gout, unspecified: Secondary | ICD-10-CM | POA: Insufficient documentation

## 2020-04-04 DIAGNOSIS — E119 Type 2 diabetes mellitus without complications: Secondary | ICD-10-CM | POA: Insufficient documentation

## 2020-04-04 DIAGNOSIS — I10 Essential (primary) hypertension: Secondary | ICD-10-CM | POA: Insufficient documentation

## 2020-04-04 MED ORDER — DEXAMETHASONE SODIUM PHOSPHATE 10 MG/ML IJ SOLN
10.0000 mg | Freq: Once | INTRAMUSCULAR | Status: AC
  Administered 2020-04-04: 10 mg via INTRAMUSCULAR
  Filled 2020-04-04: qty 1

## 2020-04-04 MED ORDER — IBUPROFEN 800 MG PO TABS: 800.0000 mg | ORAL_TABLET | Freq: Three times a day (TID) | ORAL | 0 refills | Status: DC

## 2020-04-04 MED ORDER — METHYLPREDNISOLONE 4 MG PO TBPK: ORAL_TABLET | ORAL | 0 refills | Status: DC

## 2020-04-04 NOTE — ED Triage Notes (Signed)
Pt here with reports of gout flare up in hands and feet for the last week. States he recently moved here and has not established a PCP and has ran out of his gout medications. Pt ambulatory without difficulty.

## 2020-04-04 NOTE — ED Provider Notes (Signed)
[facility] EMERGENCY DEPARTMENT Provider Note   CSN: [id number] Arrival date & time: 04/04/20  0865     History Chief Complaint  Patient presents with  . Gout    Lawrence Maynard is a 55 y.o. male who presents with gout flare. BL x1 week in the 1st MTP of both feet. He in not yet established with a pcp. Sugars have been better controlled per patient with numbers between 100-200. He usually gets a shot of decadron and a medrol pack/ He has been taking motrin without relief. Denies fevers.  HPI     Past Medical History:  Diagnosis Date  . Diabetes mellitus without complication (HCC)   . Gout   . Hypertension     There are no problems to display for this patient.   No past surgical history on file.     No family history on file.     Home Medications Prior to Admission medications   Not on File    Allergies    Patient has no known allergies.  Review of Systems   Review of Systems  Constitutional: Negative for chills and fever.  Musculoskeletal: Positive for gait problem and joint swelling.  Neurological: Negative for weakness and numbness.      Physical Exam Updated Vital Signs BP (!) 163/90   Pulse 92   Temp 97.9 F (36.6 C) (Oral)   Resp 16   Ht 5\' 7"  (1.702 m)   Wt 89.4 kg   SpO2 99%   BMI 30.85 kg/m   Physical Exam Vitals and nursing note reviewed.  Constitutional:      General: He is not in acute distress.    Appearance: He is well-developed and well-nourished. He is not diaphoretic.  HENT:     Head: Normocephalic and atraumatic.  Eyes:     General: No scleral icterus.    Conjunctiva/sclera: Conjunctivae normal.  Cardiovascular:     Rate and Rhythm: Normal rate and regular rhythm.     Heart sounds: Normal heart sounds.  Pulmonary:     Effort: Pulmonary effort is normal. No respiratory distress.     Breath sounds: Normal breath sounds.  Abdominal:     Palpations: Abdomen is soft.     Tenderness: There is no abdominal  tenderness.  Musculoskeletal:        General: No edema.     Cervical back: Normal range of motion and neck supple.  Skin:    General: Skin is warm and dry.  Neurological:     Mental Status: He is alert.  Psychiatric:        Behavior: Behavior normal.     ED Results / Procedures / Treatments   Labs (all labs ordered are listed, but only abnormal results are displayed) Labs Reviewed - No data to display  EKG None  Radiology No results found.  Procedures Procedures (including critical care time)  Medications Ordered in ED Medications - No data to display  ED Course  I have reviewed the triage vital signs and the nursing notes.  Pertinent labs & imaging results that were available during my care of the patient were reviewed by me and considered in my medical decision making (see chart for details).    MDM Rules/Calculators/A&P                          Pt presents with monoarticular pain, swelling and erythema.  Pt is afebrile and stable. Imaging reviewed, no  evidence of occult fracture or injury. Renal function good. Pt without known peptic ulcer disease and not receiving concurrent treatment on warfarin. Discussed that pt should respond to treatment with in 24 hour of begining treatment & likely resolve in 2-3 days.    Final Clinical Impression(s) / ED Diagnoses Final diagnoses:  None    Rx / DC Orders ED Discharge Orders    None       Arthor Captain, PA-C 04/04/20 7681    Derwood Kaplan, MD 04/04/20 1447

## 2020-04-04 NOTE — Discharge Instructions (Addendum)
Contact a health care provider if you have: °Another gout attack. °Continuing symptoms of a gout attack after 10 days of treatment. °Side effects from your medicines. °Chills or a fever. °Burning pain when you urinate. °Pain in your lower back or belly. °Get help right away if you: °Have severe or uncontrolled pain. °Cannot urinate. °

## 2020-04-15 ENCOUNTER — Emergency Department (HOSPITAL_COMMUNITY)
Admission: EM | Admit: 2020-04-15 | Discharge: 2020-04-15 | Disposition: A | Discharge status: Home / Self Care | Payer: BC Managed Care – PPO | Attending: Emergency Medicine | Admitting: Emergency Medicine

## 2020-04-15 ENCOUNTER — Encounter (HOSPITAL_COMMUNITY): Payer: Self-pay | Admitting: Emergency Medicine

## 2020-04-15 DIAGNOSIS — Z7982 Long term (current) use of aspirin: Secondary | ICD-10-CM | POA: Insufficient documentation

## 2020-04-15 DIAGNOSIS — Z7984 Long term (current) use of oral hypoglycemic drugs: Secondary | ICD-10-CM | POA: Insufficient documentation

## 2020-04-15 DIAGNOSIS — E119 Type 2 diabetes mellitus without complications: Secondary | ICD-10-CM | POA: Insufficient documentation

## 2020-04-15 DIAGNOSIS — M79675 Pain in left toe(s): Secondary | ICD-10-CM | POA: Diagnosis not present

## 2020-04-15 DIAGNOSIS — Z794 Long term (current) use of insulin: Secondary | ICD-10-CM | POA: Insufficient documentation

## 2020-04-15 DIAGNOSIS — I10 Essential (primary) hypertension: Secondary | ICD-10-CM | POA: Diagnosis not present

## 2020-04-15 DIAGNOSIS — M10072 Idiopathic gout, left ankle and foot: Secondary | ICD-10-CM | POA: Diagnosis not present

## 2020-04-15 DIAGNOSIS — Z79899 Other long term (current) drug therapy: Secondary | ICD-10-CM | POA: Diagnosis not present

## 2020-04-15 DIAGNOSIS — M109 Gout, unspecified: Secondary | ICD-10-CM

## 2020-04-15 LAB — CBC
HCT: 48.8 % (ref 39.0–52.0)
Hemoglobin: 15.7 g/dL (ref 13.0–17.0)
MCH: 27.5 pg (ref 26.0–34.0)
MCHC: 32.2 g/dL (ref 30.0–36.0)
MCV: 85.6 fL (ref 80.0–100.0)
Platelets: 310 10*3/uL (ref 150–400)
RBC: 5.7 MIL/uL (ref 4.22–5.81)
RDW: 12.5 % (ref 11.5–15.5)
WBC: 7.2 10*3/uL (ref 4.0–10.5)
nRBC: 0 % (ref 0.0–0.2)

## 2020-04-15 LAB — BASIC METABOLIC PANEL
Anion gap: 12 (ref 5–15)
BUN: 44 mg/dL — ABNORMAL HIGH (ref 6–20)
CO2: 22 mmol/L (ref 22–32)
Calcium: 9.9 mg/dL (ref 8.9–10.3)
Chloride: 97 mmol/L — ABNORMAL LOW (ref 98–111)
Creatinine, Ser: 1.23 mg/dL (ref 0.61–1.24)
GFR, Estimated: >60 mL/min (ref >60)
Glucose, Bld: 290 mg/dL — ABNORMAL HIGH (ref 70–99)
Potassium: 5.7 mmol/L — ABNORMAL HIGH (ref 3.5–5.1)
Sodium: 131 mmol/L — ABNORMAL LOW (ref 135–145)

## 2020-04-15 LAB — CBG MONITORING, ED: Glucose-Capillary: 260 mg/dL — ABNORMAL HIGH (ref 70–99)

## 2020-04-15 MED ORDER — DEXAMETHASONE SODIUM PHOSPHATE 10 MG/ML IJ SOLN
10.0000 mg | Freq: Once | INTRAMUSCULAR | Status: AC
  Administered 2020-04-15: 10 mg via INTRAMUSCULAR
  Filled 2020-04-15: qty 1

## 2020-04-15 MED ORDER — PREDNISONE 10 MG PO TABS: 20.0000 mg | ORAL_TABLET | Freq: Every day | ORAL | 0 refills | Status: AC

## 2020-04-15 NOTE — ED Notes (Signed)
Went to administer pt's decadron. Pt requesting medication in his hip. Explained to pt that the medicine will be just as effective in his L deltoid. Also explained to pt that administering the medicine in his "hip" would invade his privacy in the hallway. Pt stated, "Now I'm going to have two sore arms". Pt then said, "I'll have to take my shirt off". Explained to pt that he didn't have to do that in order for me to administer the shot, that he could just pull his collar of his shirt down and it could be easily administered. Pt proceeded to remove shirt for the injection. Pt also complaining about being here since 0700. Assured pt that I would let the provider know that the medication had been administered so that discharge papers can be given.

## 2020-04-15 NOTE — Discharge Instructions (Addendum)
You came to the emergency department to be evaluated for your left big toe pain.  Your physical exam was reassuring.  Your symptoms and history your pain is likely related to a gout flare.  You were given Decadron shot while in the emergency department.  You will be prescribed a 5-day course of prednisone to take at home.  Your kidney function labs were slightly elevated for hours unable to prescribe you with indomethacin.  Your potassium was also found to be elevated slightly.  Please stop taking your lisinopril medication as this can increase your potassium.  Please follow-up with your primary care provider before restarting this medication.  If you do not have a primary care provider please follow the instructions on your discharge paperwork to find 1 in the [facility].  I have also given you information for the free clinic.    I have given you a prescription for steroids today.  Some common side effects include feelings of extra energy, feeling warm, increased appetite, and stomach upset.  If you are diabetic your sugars may run higher than usual.  Please make sure to keep a watch on your blood sugars and do not miss any of your home medications.  Please return to the hospital if you have new or worsening symptoms.

## 2020-04-15 NOTE — ED Provider Notes (Signed)
[facility] EMERGENCY DEPARTMENT Provider Note   CSN: [id number] Arrival date & time: 04/15/20  2841     History No chief complaint on file.   Lawrence Maynard is a 55 y.o. male history of diabetes, hypertension, and gout.  Patient presents with a chief complaint of left great toe pain.  Patient reports that his pain began 4 days ago and has been getting progressively worse.  Rates pain 10/10 on the pain scale, worse with movement or pressure, no alleviating factors, no radiation.  Patient denies any recent injuries.  Patient reports that he has had recent gout flares in his great left toe.  Patient endorses swelling to his left great toe.  Patient denies any fevers, chills. Patient reports he has an appointment to see a provider in early February. Patient reports compliance with his diabetes and hypertension medications.     HPI     Past Medical History:  Diagnosis Date  . Diabetes mellitus without complication (HCC)   . Gout   . Hypertension     There are no problems to display for this patient.   History reviewed. No pertinent surgical history.     No family history on file.  Social History   Tobacco Use  . Smoking status: Unknown If Ever Smoked    Home Medications Prior to Admission medications   Medication Sig Start Date End Date Taking? Authorizing Provider  allopurinol (ZYLOPRIM) 100 MG tablet Take 100 mg by mouth daily. 01/16/20  Yes [provider]  aspirin 81 MG chewable tablet Chew 81 mg by mouth daily. 10/18/17  Yes [provider]  Empagliflozin-metFORMIN HCl (SYNJARDY) 12.07-998 MG TABS Take 1 tablet by mouth 2 (two) times daily. 02/06/20  Yes [provider]  ibuprofen (ADVIL) 800 MG tablet Take 1 tablet (800 mg total) by mouth 3 (three) times daily. 04/04/20  Yes Harris, Abigail, PA-C  insulin glargine, 2 Unit Dial, (TOUJEO MAX SOLOSTAR) 300 UNIT/ML Solostar Pen Inject 75 Units into the skin daily. 11/21/19  Yes  [provider]  lisinopril (ZESTRIL) 40 MG tablet Take 40 mg by mouth daily. 01/25/08  Yes [provider]  predniSONE (DELTASONE) 10 MG tablet Take 2 tablets (20 mg total) by mouth daily for 5 days. 04/15/20 04/20/20 Yes Akeisha Lagerquist, Rose Phi, PA-C  methylPREDNISolone (MEDROL DOSEPAK) 4 MG TBPK tablet Use as directed Patient not taking: Reported on 04/15/2020 04/04/20   Arthor Captain, PA-C    Allergies    Patient has no known allergies.  Review of Systems   Review of Systems  Constitutional: Negative for chills and fever.  Musculoskeletal: Positive for arthralgias (left great toe) and joint swelling (left great toe).  Skin: Negative for wound.    Physical Exam Updated Vital Signs BP 121/77 (BP Location: Left Arm)   Pulse 78   Temp 98.4 F (36.9 C) (Oral)   Resp 18   Ht 5\' 7"  (1.702 m)   Wt 89.8 kg   SpO2 96%   BMI 31.01 kg/m   Physical Exam Vitals and nursing note reviewed.  Constitutional:      General: He is not in acute distress.    Appearance: He is not ill-appearing, toxic-appearing or diaphoretic.  HENT:     Head: Normocephalic.  Eyes:     General: No scleral icterus.       Right eye: No discharge.        Left eye: No discharge.  Cardiovascular:     Rate and Rhythm: Normal rate.  Pulses:          Dorsalis pedis pulses are 3+ on the right side and 3+ on the left side.  Pulmonary:     Effort: Pulmonary effort is normal.  Musculoskeletal:     Right lower leg: No edema.     Left lower leg: No edema.     Comments: ROM intact to patient's left ankle, no deformity, erythema, swelling or tenderness to patient's left ankle  Feet:     Right foot:     Skin integrity: No erythema or warmth.     Left foot:     Skin integrity: No erythema or warmth.     Comments: Swelling to left great toe, tenderness to palpation of left great toe; no deformity noted, patient able to flex and extend toe however does complain of increased pain,  Skin:    General:  Skin is warm and dry.  Neurological:     General: No focal deficit present.     Mental Status: He is alert.  Psychiatric:        Behavior: Behavior is cooperative.     ED Results / Procedures / Treatments   Labs (all labs ordered are listed, but only abnormal results are displayed) Labs Reviewed  BASIC METABOLIC PANEL - Abnormal; Notable for the following components:      Result Value   Sodium 131 (*)    Potassium 5.7 (*)    Chloride 97 (*)    Glucose, Bld 290 (*)    BUN 44 (*)    All other components within normal limits  CBG MONITORING, ED - Abnormal; Notable for the following components:   Glucose-Capillary 260 (*)    All other components within normal limits  CBC    EKG EKG Interpretation  Date/Time:  Tuesday April 15 2020 12:30:32 EST Ventricular Rate:  74 PR Interval:  156 QRS Duration: 106 QT Interval:  372 QTC Calculation: 412 R Axis:   101 Text Interpretation: Normal sinus rhythm Rightward axis Cannot rule out Anterior infarct , age undetermined T wave abnormality, consider inferior ischemia Abnormal ECG No old tracing to compare Confirmed by Jacalyn Lefevre [phone]) on 04/15/2020 12:48:02 PM   Radiology No results found.  Procedures Procedures (including critical care time)  Medications Ordered in ED Medications  dexamethasone (DECADRON) injection 10 mg (10 mg Intramuscular Given 04/15/20 1137)    ED Course  I have reviewed the triage vital signs and the nursing notes.  Pertinent labs & imaging results that were available during my care of the patient were reviewed by me and considered in my medical decision making (see chart for details).    MDM Rules/Calculators/A&P                          Alert 55 year old male in no acute distress, nontoxic appearing. Patient presents with chief complaint of left great toe pain. Patient has a history of gout reports flares in his left great toe. Patient reports previously he has received steroids and  indomethacin to help with his pain. Patient has swelling and tenderness to left great toe, no erythema or deformity noted. Able to flex and extend left great toe with complaints of increased pain. Patient denies any recent traumatic injury. Less concerning for fracture or dislocation.    Will treat patient for gout flare.  Patient does have history of diabetes, he reports compliance with his home medications .  Will obtain CBG prior to administering Decadron.  Will prescribe patient with prednisone burst and indomethacin as long as kidney function is within normal limits.    CBC unremarkable.  BMP showed potassium 5.7 however lab notes sample was slightly hemolyzed.  BUN increased to 44 and creatinine 1.23.  Due to patient's elevated potassium EKG was obtained.  EKG did not show any changes consistent with hyperkalemia.  Patient was advised to stop taking his lisinopril until he can be seen by primary care provider and have lab work reassessed.  Patient also instructed to avoid foods with high potassium content.  Due to patient's elevated BUN and creatinine at the high range of normal limit he was not prescribed indomethacin.  Patient was given 5-day course of oral prednisone.  Patient was informed steroids can cause hyper glycemia and therefore he needed to closely monitor his blood sugars and be compliant with all diabetes medication.  Discussed results, findings, treatment and follow up. Patient advised of return precautions. Patient verbalized understanding and agreed with plan.   Final Clinical Impression(s) / ED Diagnoses Final diagnoses:  Acute gout involving toe of left foot, unspecified cause    Rx / DC Orders ED Discharge Orders         Ordered    predniSONE (DELTASONE) 10 MG tablet  Daily        04/15/20 1305           Berneice Heinrich 04/15/20 1754    Jacalyn Lefevre, MD 04/16/20 [phone]

## 2020-04-15 NOTE — ED Triage Notes (Signed)
Pt here with a gout flair up in his left foot , pt has a MD appointment  Coming up in Feb.

## 2020-04-15 NOTE — ED Notes (Signed)
Pt sitting in hallway eating and listening to phone in no apparent distress at this time.

## 2020-04-16 ENCOUNTER — Ambulatory Visit: Payer: BC Managed Care – PPO | Admitting: Family

## 2020-04-18 DIAGNOSIS — M109 Gout, unspecified: Secondary | ICD-10-CM | POA: Diagnosis not present

## 2020-04-18 DIAGNOSIS — N529 Male erectile dysfunction, unspecified: Secondary | ICD-10-CM | POA: Diagnosis not present

## 2020-04-18 DIAGNOSIS — I10 Essential (primary) hypertension: Secondary | ICD-10-CM | POA: Diagnosis not present

## 2020-04-18 DIAGNOSIS — E1169 Type 2 diabetes mellitus with other specified complication: Secondary | ICD-10-CM | POA: Diagnosis not present

## 2020-04-23 ENCOUNTER — Other Ambulatory Visit: Payer: Self-pay

## 2020-04-23 ENCOUNTER — Ambulatory Visit
Admission: EM | Admit: 2020-04-23 | Discharge: 2020-04-23 | Disposition: A | Discharge status: Home / Self Care | Payer: BC Managed Care – PPO | Attending: Urgent Care | Admitting: Urgent Care

## 2020-04-23 ENCOUNTER — Emergency Department (HOSPITAL_COMMUNITY)
Admission: EM | Admit: 2020-04-23 | Discharge: 2020-04-23 | Disposition: A | Discharge status: Home / Self Care | Payer: BC Managed Care – PPO | Attending: Emergency Medicine | Admitting: Emergency Medicine

## 2020-04-23 DIAGNOSIS — M79672 Pain in left foot: Secondary | ICD-10-CM | POA: Diagnosis not present

## 2020-04-23 DIAGNOSIS — M25562 Pain in left knee: Secondary | ICD-10-CM | POA: Diagnosis not present

## 2020-04-23 DIAGNOSIS — Z5321 Procedure and treatment not carried out due to patient leaving prior to being seen by health care provider: Secondary | ICD-10-CM | POA: Insufficient documentation

## 2020-04-23 DIAGNOSIS — M109 Gout, unspecified: Secondary | ICD-10-CM

## 2020-04-23 DIAGNOSIS — E1165 Type 2 diabetes mellitus with hyperglycemia: Secondary | ICD-10-CM

## 2020-04-23 DIAGNOSIS — M25572 Pain in left ankle and joints of left foot: Secondary | ICD-10-CM | POA: Insufficient documentation

## 2020-04-23 HISTORY — DX: Obesity, unspecified: E66.9

## 2020-04-23 MED ORDER — OXYCODONE-ACETAMINOPHEN 5-325 MG PO TABS
1.0000 | ORAL_TABLET | ORAL | Status: DC | PRN
  Administered 2020-04-23: 1 via ORAL
  Filled 2020-04-23: qty 1

## 2020-04-23 MED ORDER — KETOROLAC TROMETHAMINE 60 MG/2ML IM SOLN
60.0000 mg | Freq: Once | INTRAMUSCULAR | Status: AC
  Administered 2020-04-23: 60 mg via INTRAMUSCULAR

## 2020-04-23 MED ORDER — INDOMETHACIN 50 MG PO CAPS: 50.0000 mg | ORAL_CAPSULE | Freq: Three times a day (TID) | ORAL | 0 refills | Status: DC

## 2020-04-23 MED ORDER — PREGABALIN 50 MG PO CAPS: 50.0000 mg | ORAL_CAPSULE | Freq: Two times a day (BID) | ORAL | 0 refills | Status: DC

## 2020-04-23 NOTE — ED Triage Notes (Signed)
Pt is here with left leg/foot pain that started yesterday, pt has an appt with foot dr. On Friday. Pt was sitting at the ED for hrs & was informed he could come here, pt needs Gout meds refill.

## 2020-04-23 NOTE — ED Triage Notes (Signed)
Patient reports L left pain starting at his knee and radiating down into ankle and foot, hx of gout and diabetic nerve pain, was appt with foot doctor on Friday.

## 2020-04-23 NOTE — ED Notes (Signed)
Patient states that urgent care opens up at 8am and that is where he is going

## 2020-04-23 NOTE — ED Provider Notes (Signed)
[facility]   MRN: [id number] DOB: 1965/09/19  Subjective:   Lawrence Maynard is a 55 y.o. male presenting for acute onset recurrent severe left foot, toe pain. Has a history of uncontrolled diabetes, diabetic polyneuropathy, gout. He stopped taking allopurinol. He used gabapentin before without any relief. Denies fall, trauma, redness, warmth, wound. Has used colchicine in the past but is very expensive and does not want this. He does better with Toradol, steroids. Denies history of heart disease, MI. No CKD.  No current facility-administered medications for this encounter.  Current Outpatient Medications:  .  allopurinol (ZYLOPRIM) 100 MG tablet, Take 100 mg by mouth daily., Disp: , Rfl:  .  aspirin 81 MG chewable tablet, Chew 81 mg by mouth daily., Disp: , Rfl:  .  Empagliflozin-metFORMIN HCl (SYNJARDY) 12.07-998 MG TABS, Take 1 tablet by mouth 2 (two) times daily., Disp: , Rfl:  .  ibuprofen (ADVIL) 800 MG tablet, Take 1 tablet (800 mg total) by mouth 3 (three) times daily., Disp: 21 tablet, Rfl: 0 .  insulin glargine, 2 Unit Dial, (TOUJEO MAX SOLOSTAR) 300 UNIT/ML Solostar Pen, Inject 75 Units into the skin daily., Disp: , Rfl:  .  lisinopril (ZESTRIL) 40 MG tablet, Take 40 mg by mouth daily., Disp: , Rfl:  .  methylPREDNISolone (MEDROL DOSEPAK) 4 MG TBPK tablet, Use as directed (Patient not taking: No sig reported), Disp: 21 tablet, Rfl: 0   No Known Allergies  Past Medical History:  Diagnosis Date  . Diabetes mellitus without complication (HCC)   . Gout   . Hypertension   . Obesity      History reviewed. No pertinent surgical history.  Family History  Problem Relation Age of Onset  . Diabetes Mother   . Hypertension Mother   . Gout Mother   . Diabetes Father   . Hypertension Father   . Gout Father     Social History   Tobacco Use  . Smoking status: Never Smoker  . Smokeless tobacco: Never Used  Substance Use Topics  . Alcohol use: Not Currently     ROS   Objective:   Vitals: BP 119/78 (BP Location: Left Arm)   Pulse 72   Temp 97.6 F (36.4 C) (Oral)   Resp 18   SpO2 98%   Physical Exam Constitutional:      General: He is not in acute distress.    Appearance: Normal appearance. He is well-developed. He is not ill-appearing, toxic-appearing or diaphoretic.  HENT:     Head: Normocephalic and atraumatic.     Right Ear: External ear normal.     Left Ear: External ear normal.     Nose: Nose normal.     Mouth/Throat:     Mouth: Mucous membranes are moist.     Pharynx: Oropharynx is clear.  Eyes:     General: No scleral icterus.    Extraocular Movements: Extraocular movements intact.     Pupils: Pupils are equal, round, and reactive to light.  Cardiovascular:     Rate and Rhythm: Normal rate and regular rhythm.     Heart sounds: Normal heart sounds. No murmur heard. No friction rub. No gallop.   Pulmonary:     Effort: Pulmonary effort is normal. No respiratory distress.     Breath sounds: Normal breath sounds. No stridor. No wheezing, rhonchi or rales.  Musculoskeletal:       Feet:  Neurological:     Mental Status: He is alert and oriented to person, place, and  time.  Psychiatric:        Mood and Affect: Mood normal.        Behavior: Behavior normal.        Thought Content: Thought content normal.     Assessment and Plan :   PDMP not reviewed this encounter.  1. Uncontrolled type 2 diabetes mellitus with hyperglycemia (HCC)   2. Gout, unspecified cause, unspecified chronicity, unspecified site     Low suspicion for gout given physical exam findings. Will use Toradol for pain relief. Suspect diabetic neuropathy, start Lyrica. Emphasized need for better diabetic control, follow up with PCP. Counseled patient on potential for adverse effects with medications prescribed/recommended today, ER and return-to-clinic precautions discussed, patient verbalized understanding.    Wallis Bamberg, PA-C 04/23/20 1039

## 2020-04-25 ENCOUNTER — Ambulatory Visit (INDEPENDENT_AMBULATORY_CARE_PROVIDER_SITE_OTHER): Payer: BC Managed Care – PPO | Admitting: Podiatry

## 2020-04-25 ENCOUNTER — Ambulatory Visit (INDEPENDENT_AMBULATORY_CARE_PROVIDER_SITE_OTHER): Payer: BC Managed Care – PPO

## 2020-04-25 ENCOUNTER — Other Ambulatory Visit: Payer: Self-pay | Admitting: Podiatry

## 2020-04-25 ENCOUNTER — Other Ambulatory Visit: Payer: Self-pay

## 2020-04-25 DIAGNOSIS — M79671 Pain in right foot: Secondary | ICD-10-CM

## 2020-04-25 DIAGNOSIS — M779 Enthesopathy, unspecified: Secondary | ICD-10-CM

## 2020-04-25 DIAGNOSIS — M205X9 Other deformities of toe(s) (acquired), unspecified foot: Secondary | ICD-10-CM | POA: Diagnosis not present

## 2020-04-25 DIAGNOSIS — I999 Unspecified disorder of circulatory system: Secondary | ICD-10-CM | POA: Diagnosis not present

## 2020-04-25 DIAGNOSIS — M79672 Pain in left foot: Secondary | ICD-10-CM

## 2020-04-25 MED ORDER — TRIAMCINOLONE ACETONIDE 10 MG/ML IJ SUSP: 10.0000 mg | Freq: Once | INTRAMUSCULAR | Status: DC

## 2020-04-25 MED ORDER — TRAMADOL HCL 50 MG PO TABS: 50.0000 mg | ORAL_TABLET | Freq: Three times a day (TID) | ORAL | 2 refills | Status: DC

## 2020-04-27 NOTE — Progress Notes (Signed)
Subjective:   Patient ID: Lawrence Maynard, male   DOB: 55 y.o.   MRN: [id number]   HPI Patient presents stating he has had intense discomfort mostly in his left lower leg extending to his left foot which has been getting worse for the last 6 months.  He has been to numerous doctors and has had chiropractic care without relief of symptoms and does have significant family history for his mother requiring amputation of both legs for vascular disease.  Patient does not smoke and would like to be more active   Review of Systems  All other systems reviewed and are negative.       Objective:  Physical Exam Vitals and nursing note reviewed.  Constitutional:      Appearance: He is well-developed and well-nourished.  Cardiovascular:     Pulses: Intact distal pulses.  Pulmonary:     Effort: Pulmonary effort is normal.  Musculoskeletal:        General: Normal range of motion.  Skin:    General: Skin is warm.  Neurological:     Mental Status: He is alert.     Neurovascular status indicates no pulses currently of the DP PT left over right with diminished digital perfusion noted and exquisite discomfort also noted the first MPJ left over right.  Is having trouble with walking and upon questioning appears to have significant claudication symptomatology     Assessment:  Probability that were dealing with a vascular issue with significant family history of this condition with possible low-grade inflammatory condition with capsulitis first MPJ left     Plan:  H&P x-rays reviewed condition discussed at great length.  Today I did go ahead and I did sterile prep and injected around the first MPJ left 3 mg dexamethasone Kenalog 5 mg Xylocaine to try to help short-term and I am placing him on pain medication and we are sending him for stat vascular analysis which will be done next week.  Encouraged to call questions concerns which may arise  X-rays were negative for signs of fracture or indications of  significant arthritis of joint surfaces

## 2020-04-29 ENCOUNTER — Encounter: Payer: Self-pay | Admitting: Cardiovascular Disease

## 2020-04-29 ENCOUNTER — Other Ambulatory Visit: Payer: Self-pay

## 2020-04-29 ENCOUNTER — Ambulatory Visit (HOSPITAL_BASED_OUTPATIENT_CLINIC_OR_DEPARTMENT_OTHER)
Admission: RE | Admit: 2020-04-29 | Discharge: 2020-04-29 | Disposition: A | Discharge status: Home / Self Care | Payer: BC Managed Care – PPO | Source: Ambulatory Visit | Attending: Cardiovascular Disease | Admitting: Cardiovascular Disease

## 2020-04-29 ENCOUNTER — Encounter (HOSPITAL_COMMUNITY): Payer: Self-pay | Admitting: *Deleted

## 2020-04-29 ENCOUNTER — Ambulatory Visit (INDEPENDENT_AMBULATORY_CARE_PROVIDER_SITE_OTHER): Payer: BC Managed Care – PPO | Admitting: Cardiovascular Disease

## 2020-04-29 ENCOUNTER — Emergency Department (HOSPITAL_COMMUNITY)
Admission: EM | Admit: 2020-04-29 | Discharge: 2020-04-29 | Disposition: A | Discharge status: Home / Self Care | Payer: BC Managed Care – PPO | Attending: Emergency Medicine | Admitting: Emergency Medicine

## 2020-04-29 DIAGNOSIS — E669 Obesity, unspecified: Secondary | ICD-10-CM | POA: Diagnosis not present

## 2020-04-29 DIAGNOSIS — Z79899 Other long term (current) drug therapy: Secondary | ICD-10-CM | POA: Insufficient documentation

## 2020-04-29 DIAGNOSIS — E782 Mixed hyperlipidemia: Secondary | ICD-10-CM

## 2020-04-29 DIAGNOSIS — I10 Essential (primary) hypertension: Secondary | ICD-10-CM

## 2020-04-29 DIAGNOSIS — M79605 Pain in left leg: Secondary | ICD-10-CM | POA: Diagnosis not present

## 2020-04-29 DIAGNOSIS — M79672 Pain in left foot: Secondary | ICD-10-CM | POA: Diagnosis not present

## 2020-04-29 DIAGNOSIS — E1169 Type 2 diabetes mellitus with other specified complication: Secondary | ICD-10-CM | POA: Diagnosis not present

## 2020-04-29 DIAGNOSIS — M79662 Pain in left lower leg: Secondary | ICD-10-CM | POA: Diagnosis not present

## 2020-04-29 DIAGNOSIS — I999 Unspecified disorder of circulatory system: Secondary | ICD-10-CM | POA: Insufficient documentation

## 2020-04-29 DIAGNOSIS — I739 Peripheral vascular disease, unspecified: Secondary | ICD-10-CM | POA: Diagnosis not present

## 2020-04-29 DIAGNOSIS — Z794 Long term (current) use of insulin: Secondary | ICD-10-CM | POA: Insufficient documentation

## 2020-04-29 DIAGNOSIS — Z7982 Long term (current) use of aspirin: Secondary | ICD-10-CM | POA: Insufficient documentation

## 2020-04-29 DIAGNOSIS — E785 Hyperlipidemia, unspecified: Secondary | ICD-10-CM | POA: Insufficient documentation

## 2020-04-29 DIAGNOSIS — M109 Gout, unspecified: Secondary | ICD-10-CM | POA: Insufficient documentation

## 2020-04-29 MED ORDER — METHYLPREDNISOLONE SODIUM SUCC 125 MG IJ SOLR
125.0000 mg | Freq: Once | INTRAMUSCULAR | Status: AC
  Administered 2020-04-29: 125 mg via INTRAMUSCULAR
  Filled 2020-04-29: qty 2

## 2020-04-29 MED ORDER — OXYCODONE-ACETAMINOPHEN 5-325 MG PO TABS
1.0000 | ORAL_TABLET | ORAL | Status: AC | PRN
  Administered 2020-04-29 (×2): 1 via ORAL
  Filled 2020-04-29 (×2): qty 1

## 2020-04-29 NOTE — H&P (View-Only) (Signed)
04/29/2020 Lawrence Maynard   [birth year]  [id number]  Primary Physician Lawrence Maynard, Lawrence Abrahams, NP Primary Cardiologist: Lawrence Gess MD Lawrence Maynard, Lawrence Maynard  HPI:  Lawrence Maynard is a 55 y.o. mild to moderately overweight separated African-American male father of 2 daughters, grandfather to 2 grandchildren referred by Dr. Dellia Maynard for peripheral vascular valuation because of resting foot pain.  He works in [employer] and at low speed.  He recently relocated from [facility] to [place].  He has had a stroke in the past and had a loop recorder implanted and explanted without demonstration of an arrhythmia.  He does have treated hypertension, diabetes and hyperlipidemia.  He denies chest pain.  He has had left foot pain and left calf claudication which is gotten worse over the last 6 months now to the point of rest pain.  Recent Dopplers performed today revealed noncompressible ABIs with tibial disease on the left.   Current Meds  Medication Sig  . allopurinol (ZYLOPRIM) 100 MG tablet Take 100 mg by mouth daily.  Marland Kitchen aspirin 81 MG chewable tablet Chew 81 mg by mouth daily.  . carvedilol (COREG) 6.25 MG tablet Take 6.25 mg by mouth 2 (two) times daily with a meal.  . Empagliflozin-metFORMIN HCl (SYNJARDY) 12.07-998 MG TABS Take 1 tablet by mouth 2 (two) times daily.  Marland Kitchen ibuprofen (ADVIL) 800 MG tablet Take 1 tablet (800 mg total) by mouth 3 (three) times daily.  . indomethacin (INDOCIN) 50 MG capsule Take 1 capsule (50 mg total) by mouth 3 (three) times daily with meals.  . insulin glargine, 2 Unit Dial, (TOUJEO MAX SOLOSTAR) 300 UNIT/ML Solostar Pen Inject 75 Units into the skin daily.  Marland Kitchen lisinopril (ZESTRIL) 40 MG tablet Take 40 mg by mouth daily.  Marland Kitchen PLAVIX 75 MG tablet Take 75 mg by mouth daily.  . predniSONE (DELTASONE) 20 MG tablet Take 20 mg by mouth 2 (two) times daily.  . pregabalin (LYRICA) 50 MG capsule Take 1 capsule (50 mg total) by mouth 2 (two) times daily.  .  rosuvastatin (CRESTOR) 20 MG tablet Take 20 mg by mouth at bedtime.  . sildenafil (VIAGRA) 50 MG tablet Take by mouth.  . traMADol (ULTRAM) 50 MG tablet Take 1 tablet (50 mg total) by mouth 3 (three) times daily.   Current Facility-Administered Medications for the 04/29/20 encounter (Office Visit) with Lawrence Gess, MD  Medication  . triamcinolone acetonide (KENALOG) 10 MG/ML injection 10 mg     No Known Allergies  Social History   Socioeconomic History  . Marital status: Single    Spouse name: Not on file  . Number of children: Not on file  . Years of education: Not on file  . Highest education level: Not on file  Occupational History  . Not on file  Tobacco Use  . Smoking status: Never Smoker  . Smokeless tobacco: Never Used  Substance and Sexual Activity  . Alcohol use: Not Currently  . Drug use: Not on file  . Sexual activity: Not Currently  Other Topics Concern  . Not on file  Social History Narrative  . Not on file   Social Determinants of Health   Financial Resource Strain: Not on file  Food Insecurity: Not on file  Transportation Needs: Not on file  Physical Activity: Not on file  Stress: Not on file  Social Connections: Not on file  Intimate Partner Violence: Not on file     Review of Systems: General: negative for chills,  fever, night sweats or weight changes.  Cardiovascular: negative for chest pain, dyspnea on exertion, edema, orthopnea, palpitations, paroxysmal nocturnal dyspnea or shortness of breath Dermatological: negative for rash Respiratory: negative for cough or wheezing Urologic: negative for hematuria Abdominal: negative for nausea, vomiting, diarrhea, bright red blood per rectum, melena, or hematemesis Neurologic: negative for visual changes, syncope, or dizziness All other systems reviewed and are otherwise negative except as noted above.    Blood pressure 90/68, pulse 78, height 5\' 7"  (1.702 m), weight 211 lb 9.6 oz (96 kg).   General appearance: alert and no distress Neck: no adenopathy, no carotid bruit, no JVD, supple, symmetrical, trachea midline and thyroid not enlarged, symmetric, no tenderness/mass/nodules Lungs: clear to auscultation bilaterally Heart: regular rate and rhythm, S1, S2 normal, no murmur, click, rub or gallop Extremities: extremities normal, atraumatic, no cyanosis or edema Pulses: 2+ and symmetric Diminished pulses bilaterally Skin: Skin color, texture, turgor normal. No rashes or lesions Neurologic: Alert and oriented X 3, normal strength and tone. Normal symmetric reflexes. Normal coordination and gait  EKG not performed today  ASSESSMENT AND PLAN:   Essential hypertension History of essential potential blood pressure measured today at 90/68.  He is on carvedilol and lisinopril.  Hyperlipidemia History of hyperlipidemia on statin therapy with lipid profile performed 04/18/2020 revealing total cholesterol of 214, LDL of 141 and HDL of 41.  He is on rosuvastatin 20 mg a day.  Peripheral arterial disease [facility]) Lawrence Maynard has been having left foot pain and claudication for the last 6 months which has gotten worse and now occurs at rest.  He was referred by Dr. Delford Field , his podiatrist for evaluation.  He had Doppler studies performed in our office today revealing noncompressible ABIs with severe infrapopliteal disease on the left.  He wishes to proceed with outpatient angiography potential endovascular therapy.      Lawrence Merles MD FACP,FACC,FAHA, [facility] 04/29/2020 3:35 PM

## 2020-04-29 NOTE — Assessment & Plan Note (Signed)
Lawrence Maynard has been having left foot pain and claudication for the last 6 months which has gotten worse and now occurs at rest.  He was referred by Dr. Charlsie Merles , his podiatrist for evaluation.  He had Doppler studies performed in our office today revealing noncompressible ABIs with severe infrapopliteal disease on the left.  He wishes to proceed with outpatient angiography potential endovascular therapy.

## 2020-04-29 NOTE — Assessment & Plan Note (Signed)
History of hyperlipidemia on statin therapy with lipid profile performed 04/18/2020 revealing total cholesterol of 214, LDL of 141 and HDL of 41.  He is on rosuvastatin 20 mg a day.

## 2020-04-29 NOTE — Patient Instructions (Signed)
    [facility] CARDIOVASCULAR DIVISION [facility] [address] [place] [address] Dept: [phone] Loc: [phone]  [name]  04/29/2020  You are scheduled for a Peripheral Angiogram on Monday, February 7 with Dr. Nanetta Batty.  1. Please arrive at the [facility] (Main Entrance A) at [facility]: [address] [place], [place] [address] at 7:30 AM (This time is two hours before your procedure to ensure your preparation). Free valet parking service is available.   Special note: Every effort is made to have your procedure done on time. Please understand that emergencies sometimes delay scheduled procedures.  2. Diet: Do not eat solid foods after midnight.  The patient may have clear liquids until 5am upon the day of the procedure.  3. Labs: You will need to have blood drawn today.  4. Medication instructions in preparation for your procedure:   Do not take Diabetes Med Synjardy on the day of the procedure and HOLD 48 HOURS AFTER THE PROCEDURE.  On the morning of your procedure, take your Aspirin and Plavix/Clopidogrel and any morning medicines NOT listed above.  You may use sips of water.  5. Plan for one night stay--bring personal belongings. 6. Bring a current list of your medications and current insurance cards. 7. You MUST have a responsible person to drive you home. 8. Someone MUST be with you the first 24 hours after you arrive home or your discharge will be delayed. 9. Please wear clothes that are easy to get on and off and wear slip-on shoes.  Thank you for allowing [place] to care for you!   -- [facility]  You will need a COVID-19  test prior to your procedure. You are scheduled for Friday 2/4 at 8:30 AM. This is a Drive Up Visit at [address] [place], [place] [address]. Someone will direct you to the appropriate testing line. Stay in your car and someone will be with  you shortly.  Pt will need lower extremity dopplers 1 week after procedure.  Pt will need follow up office visit 2 weeks after procedure.

## 2020-04-29 NOTE — ED Provider Notes (Signed)
[facility] EMERGENCY DEPARTMENT Provider Note   CSN: [id number] Arrival date & time: 04/29/20  0019     History Chief Complaint  Patient presents with  . Leg Pain  . Gout    Lawrence Maynard is a 55 y.o. male.  The history is provided by the patient. No language interpreter was used.  Leg Pain Location:  Leg and foot Injury: no   Leg location:  L leg Pain details:    Quality:  Aching   Radiates to:  Does not radiate   Severity:  Moderate   Timing:  Constant   Progression:  Worsening Dislocation: no   Relieved by:  Nothing Worsened by:  Nothing Ineffective treatments:  None tried Risk factors: no concern for non-accidental trauma   Pt  Reports he has gout in his foot.  Pt request a shot of steroids.  Pt is seeing Dr. Charlsie Merles and is scheduled for a vascular study today due to tabescence of pulse.  Pt reports he was unable to afford rx for colchicine.       Past Medical History:  Diagnosis Date  . Diabetes mellitus without complication (HCC)   . Gout   . Hypertension   . Obesity     There are no problems to display for this patient.   History reviewed. No pertinent surgical history.     Family History  Problem Relation Age of Onset  . Diabetes Mother   . Hypertension Mother   . Gout Mother   . Diabetes Father   . Hypertension Father   . Gout Father     Social History   Tobacco Use  . Smoking status: Never Smoker  . Smokeless tobacco: Never Used  Substance Use Topics  . Alcohol use: Not Currently    Home Medications Prior to Admission medications   Medication Sig Start Date End Date Taking? Authorizing Provider  allopurinol (ZYLOPRIM) 100 MG tablet Take 100 mg by mouth daily. 01/16/20   [provider]  aspirin 81 MG chewable tablet Chew 81 mg by mouth daily. 10/18/17   [provider]  Empagliflozin-metFORMIN HCl (SYNJARDY) 12.07-998 MG TABS Take 1 tablet by mouth 2 (two) times daily. 02/06/20   [provider]  ibuprofen (ADVIL) 800 MG tablet Take 1 tablet (800 mg total) by mouth 3 (three) times daily. 04/04/20   Arthor Captain, PA-C  indomethacin (INDOCIN) 50 MG capsule Take 1 capsule (50 mg total) by mouth 3 (three) times daily with meals. 04/23/20   Wallis Bamberg, PA-C  insulin glargine, 2 Unit Dial, (TOUJEO MAX SOLOSTAR) 300 UNIT/ML Solostar Pen Inject 75 Units into the skin daily. 11/21/19   [provider]  lisinopril (ZESTRIL) 40 MG tablet Take 40 mg by mouth daily. 01/25/08   [provider]  pregabalin (LYRICA) 50 MG capsule Take 1 capsule (50 mg total) by mouth 2 (two) times daily. 04/23/20   Wallis Bamberg, PA-C  traMADol (ULTRAM) 50 MG tablet Take 1 tablet (50 mg total) by mouth 3 (three) times daily. 04/25/20   Lenn Sink, DPM    Allergies    Patient has no known allergies.  Review of Systems   Review of Systems  All other systems reviewed and are negative.   Physical Exam Updated Vital Signs BP 125/75 (BP Location: Right Arm)   Pulse 77   Temp 97.8 F (36.6 C) (Oral)   Resp 18   SpO2 100%   Physical Exam Vitals and nursing note reviewed.  Constitutional:  Appearance: He is well-developed and well-nourished.  HENT:     Head: Normocephalic.  Eyes:     Extraocular Movements: EOM normal.  Pulmonary:     Effort: Pulmonary effort is normal.  Abdominal:     General: There is no distension.  Musculoskeletal:        General: Normal range of motion.     Cervical back: Normal range of motion.     Comments: Left foot is warm,  I do not feel a pulse.   Skin:    General: Skin is warm.  Neurological:     General: No focal deficit present.     Mental Status: He is alert and oriented to person, place, and time.  Psychiatric:        Mood and Affect: Mood and affect normal.     ED Results / Procedures / Treatments   Labs (all labs ordered are listed, but only abnormal results are displayed) Labs Reviewed - No data to  display  EKG None  Radiology No results found.  Procedures Procedures   Medications Ordered in ED Medications  oxyCODONE-acetaminophen (PERCOCET/ROXICET) 5-325 MG per tablet 1 tablet (1 tablet Oral Given 04/29/20 0043)  methylPREDNISolone sodium succinate (SOLU-MEDROL) 125 mg/2 mL injection 125 mg (has no administration in time range)    ED Course  I have reviewed the triage vital signs and the nursing notes.  Pertinent labs & imaging results that were available during my care of the patient were reviewed by me and considered in my medical decision making (see chart for details).    MDM Rules/Calculators/A&P                          MDM:  Pt given solumedrol.  Pt advised to keep 1:00 appointment for ultrasound and follow up with Dr. Charlsie Merles as scheduled  Final Clinical Impression(s) / ED Diagnoses Final diagnoses:  Left leg pain    Rx / DC Orders ED Discharge Orders    None    An After Visit Summary was printed and given to the patient.    Elson Areas, PA-C 04/29/20 1104    Melene Plan, DO 04/29/20 1106

## 2020-04-29 NOTE — Discharge Instructions (Addendum)
Follow up with vascular clinic today for ultrasound as scheduled.  Take medications as directed

## 2020-04-29 NOTE — Assessment & Plan Note (Signed)
History of essential potential blood pressure measured today at 90/68.  He is on carvedilol and lisinopril.

## 2020-04-29 NOTE — ED Triage Notes (Addendum)
Pt reports having gout, pain to left lower leg and foot. Has been to pcp and given meds with no relief.

## 2020-04-29 NOTE — Progress Notes (Signed)
04/29/2020 Lawrence Maynard   [birth year]  [id number]  Primary Physician Placey, Chales Abrahams, NP Primary Cardiologist: Runell Gess MD Nicholes Calamity, [place]  HPI:  Lawrence Maynard is a 55 y.o. mild to moderately overweight separated African-American male father of 2 daughters, grandfather to 2 grandchildren referred by Dr. Dellia Nims for peripheral vascular valuation because of resting foot pain.  He works in [employer] and at low speed.  He recently relocated from [facility] to [place].  He has had a stroke in the past and had a loop recorder implanted and explanted without demonstration of an arrhythmia.  He does have treated hypertension, diabetes and hyperlipidemia.  He denies chest pain.  He has had left foot pain and left calf claudication which is gotten worse over the last 6 months now to the point of rest pain.  Recent Dopplers performed today revealed noncompressible ABIs with tibial disease on the left.   Current Meds  Medication Sig  . allopurinol (ZYLOPRIM) 100 MG tablet Take 100 mg by mouth daily.  Marland Kitchen aspirin 81 MG chewable tablet Chew 81 mg by mouth daily.  . carvedilol (COREG) 6.25 MG tablet Take 6.25 mg by mouth 2 (two) times daily with a meal.  . Empagliflozin-metFORMIN HCl (SYNJARDY) 12.07-998 MG TABS Take 1 tablet by mouth 2 (two) times daily.  Marland Kitchen ibuprofen (ADVIL) 800 MG tablet Take 1 tablet (800 mg total) by mouth 3 (three) times daily.  . indomethacin (INDOCIN) 50 MG capsule Take 1 capsule (50 mg total) by mouth 3 (three) times daily with meals.  . insulin glargine, 2 Unit Dial, (TOUJEO MAX SOLOSTAR) 300 UNIT/ML Solostar Pen Inject 75 Units into the skin daily.  Marland Kitchen lisinopril (ZESTRIL) 40 MG tablet Take 40 mg by mouth daily.  Marland Kitchen PLAVIX 75 MG tablet Take 75 mg by mouth daily.  . predniSONE (DELTASONE) 20 MG tablet Take 20 mg by mouth 2 (two) times daily.  . pregabalin (LYRICA) 50 MG capsule Take 1 capsule (50 mg total) by mouth 2 (two) times daily.  .  rosuvastatin (CRESTOR) 20 MG tablet Take 20 mg by mouth at bedtime.  . sildenafil (VIAGRA) 50 MG tablet Take by mouth.  . traMADol (ULTRAM) 50 MG tablet Take 1 tablet (50 mg total) by mouth 3 (three) times daily.   Current Facility-Administered Medications for the 04/29/20 encounter (Office Visit) with Runell Gess, MD  Medication  . triamcinolone acetonide (KENALOG) 10 MG/ML injection 10 mg     No Known Allergies  Social History   Socioeconomic History  . Marital status: Single    Spouse name: Not on file  . Number of children: Not on file  . Years of education: Not on file  . Highest education level: Not on file  Occupational History  . Not on file  Tobacco Use  . Smoking status: Never Smoker  . Smokeless tobacco: Never Used  Substance and Sexual Activity  . Alcohol use: Not Currently  . Drug use: Not on file  . Sexual activity: Not Currently  Other Topics Concern  . Not on file  Social History Narrative  . Not on file   Social Determinants of Health   Financial Resource Strain: Not on file  Food Insecurity: Not on file  Transportation Needs: Not on file  Physical Activity: Not on file  Stress: Not on file  Social Connections: Not on file  Intimate Partner Violence: Not on file     Review of Systems: General: negative for chills,  fever, night sweats or weight changes.  Cardiovascular: negative for chest pain, dyspnea on exertion, edema, orthopnea, palpitations, paroxysmal nocturnal dyspnea or shortness of breath Dermatological: negative for rash Respiratory: negative for cough or wheezing Urologic: negative for hematuria Abdominal: negative for nausea, vomiting, diarrhea, bright red blood per rectum, melena, or hematemesis Neurologic: negative for visual changes, syncope, or dizziness All other systems reviewed and are otherwise negative except as noted above.    Blood pressure 90/68, pulse 78, height 5' 7" (1.702 m), weight 211 lb 9.6 oz (96 kg).   General appearance: alert and no distress Neck: no adenopathy, no carotid bruit, no JVD, supple, symmetrical, trachea midline and thyroid not enlarged, symmetric, no tenderness/mass/nodules Lungs: clear to auscultation bilaterally Heart: regular rate and rhythm, S1, S2 normal, no murmur, click, rub or gallop Extremities: extremities normal, atraumatic, no cyanosis or edema Pulses: 2+ and symmetric Diminished pulses bilaterally Skin: Skin color, texture, turgor normal. No rashes or lesions Neurologic: Alert and oriented X 3, normal strength and tone. Normal symmetric reflexes. Normal coordination and gait  EKG not performed today  ASSESSMENT AND PLAN:   Essential hypertension History of essential potential blood pressure measured today at 90/68.  He is on carvedilol and lisinopril.  Hyperlipidemia History of hyperlipidemia on statin therapy with lipid profile performed 04/18/2020 revealing total cholesterol of 214, LDL of 141 and HDL of 41.  He is on rosuvastatin 20 mg a day.  Peripheral arterial disease (HCC) Lawrence Maynard has been having left foot pain and claudication for the last 6 months which has gotten worse and now occurs at rest.  He was referred by Dr. Regal , his podiatrist for evaluation.  He had Doppler studies performed in our office today revealing noncompressible ABIs with severe infrapopliteal disease on the left.  He wishes to proceed with outpatient angiography potential endovascular therapy.      [name] J. Mandell Pangborn MD FACP,FACC,FAHA, FSCAI 04/29/2020 3:35 PM 

## 2020-04-30 ENCOUNTER — Other Ambulatory Visit: Payer: Self-pay

## 2020-04-30 LAB — CBC
Hematocrit: 46.6 % (ref 37.5–51.0)
Hemoglobin: 15.3 g/dL (ref 13.0–17.7)
MCH: 27 pg (ref 26.6–33.0)
MCHC: 32.8 g/dL (ref 31.5–35.7)
MCV: 82 fL (ref 79–97)
Platelets: 275 10*3/uL (ref 150–450)
RBC: 5.66 x10E6/uL (ref 4.14–5.80)
RDW: 12.2 % (ref 11.6–15.4)
WBC: 7.2 10*3/uL (ref 3.4–10.8)

## 2020-04-30 LAB — BASIC METABOLIC PANEL
BUN/Creatinine Ratio: 30 — ABNORMAL HIGH (ref 9–20)
BUN: 33 mg/dL — ABNORMAL HIGH (ref 6–24)
CO2: 21 mmol/L (ref 20–29)
Calcium: 9.7 mg/dL (ref 8.7–10.2)
Chloride: 102 mmol/L (ref 96–106)
Creatinine, Ser: 1.11 mg/dL (ref 0.76–1.27)
GFR calc Af Amer: 88 mL/min/{1.73_m2} (ref >59)
GFR calc non Af Amer: 76 mL/min/{1.73_m2} (ref >59)
Glucose: 171 mg/dL — ABNORMAL HIGH (ref 65–99)
Potassium: 5.3 mmol/L — ABNORMAL HIGH (ref 3.5–5.2)
Sodium: 138 mmol/L (ref 134–144)

## 2020-04-30 NOTE — Progress Notes (Signed)
This patient should be seen by you I think. I'm hopeful you can help the symptoms in his left leg and foot

## 2020-05-01 ENCOUNTER — Telehealth: Payer: Self-pay | Admitting: *Deleted

## 2020-05-01 ENCOUNTER — Other Ambulatory Visit: Payer: Self-pay

## 2020-05-01 ENCOUNTER — Telehealth: Payer: Self-pay | Admitting: Cardiovascular Disease

## 2020-05-01 NOTE — Telephone Encounter (Signed)
Pt contacted pre-catheterization scheduled at [facility] for: Monday May 05, 2020 9:30 AM Verified arrival time and place: [facility] Main Entrance A [facility]) at: 7:30 AM   No solid food after midnight prior to cath, clear liquids until 5 AM day of procedure.  Hold: Synjardy-day of procedure and 48 hours post procedure Insulin-AM of procedure  Except hold medications AM meds can be  taken pre-cath with sips of water including: ASA 81 mg Plavix 75 mg  Confirmed patient has responsible adult to drive home post procedure and be with patient first 24 hours after arriving home: yes  You are allowed ONE visitor in the waiting room during the time you are at the hospital for your procedure. Both you and your visitor must wear a mask once you enter the hospital.  Reviewed procedure/mask/visitor instructions with patient.

## 2020-05-01 NOTE — Telephone Encounter (Signed)
Patient states he received a call from [facility] today. He states they told him he has an appointment tomorrow. He would also like to clarify what medications to take for his procedure and if there will be stitches.

## 2020-05-01 NOTE — Progress Notes (Signed)
Thanks, couldn't tell whether they had him teed up for consult. Obviously important

## 2020-05-01 NOTE — Telephone Encounter (Signed)
Spoke with pt, he reports the nerve testing is free tomorrow if he wants it. Explained he can do it if he would like, will not change or impact the planned procedure. Questions regarding procedure on Monday answered.

## 2020-05-02 ENCOUNTER — Other Ambulatory Visit (HOSPITAL_COMMUNITY)
Admission: RE | Admit: 2020-05-02 | Discharge: 2020-05-02 | Disposition: A | Discharge status: Home / Self Care | Payer: BC Managed Care – PPO | Source: Ambulatory Visit | Attending: Cardiovascular Disease | Admitting: Cardiovascular Disease

## 2020-05-02 DIAGNOSIS — Z20822 Contact with and (suspected) exposure to covid-19: Secondary | ICD-10-CM | POA: Insufficient documentation

## 2020-05-02 DIAGNOSIS — Z01812 Encounter for preprocedural laboratory examination: Secondary | ICD-10-CM | POA: Insufficient documentation

## 2020-05-02 LAB — SARS CORONAVIRUS 2 (TAT 6-24 HRS): SARS Coronavirus 2: NEGATIVE

## 2020-05-05 ENCOUNTER — Ambulatory Visit (HOSPITAL_COMMUNITY)
Admission: RE | Disposition: A | Discharge status: Home / Self Care | Payer: Self-pay | Source: Home / Self Care | Attending: Cardiovascular Disease

## 2020-05-05 ENCOUNTER — Other Ambulatory Visit: Payer: Self-pay

## 2020-05-05 ENCOUNTER — Ambulatory Visit (HOSPITAL_COMMUNITY)
Admission: RE | Admit: 2020-05-05 | Discharge: 2020-05-06 | Disposition: A | Discharge status: Home / Self Care | Payer: BC Managed Care – PPO | Attending: Cardiovascular Disease | Admitting: Cardiovascular Disease

## 2020-05-05 DIAGNOSIS — Z794 Long term (current) use of insulin: Secondary | ICD-10-CM | POA: Insufficient documentation

## 2020-05-05 DIAGNOSIS — I70222 Atherosclerosis of native arteries of extremities with rest pain, left leg: Secondary | ICD-10-CM | POA: Diagnosis not present

## 2020-05-05 DIAGNOSIS — E119 Type 2 diabetes mellitus without complications: Secondary | ICD-10-CM

## 2020-05-05 DIAGNOSIS — I10 Essential (primary) hypertension: Secondary | ICD-10-CM | POA: Insufficient documentation

## 2020-05-05 DIAGNOSIS — Z7902 Long term (current) use of antithrombotics/antiplatelets: Secondary | ICD-10-CM | POA: Insufficient documentation

## 2020-05-05 DIAGNOSIS — Z791 Long term (current) use of non-steroidal anti-inflammatories (NSAID): Secondary | ICD-10-CM | POA: Diagnosis not present

## 2020-05-05 DIAGNOSIS — E1151 Type 2 diabetes mellitus with diabetic peripheral angiopathy without gangrene: Secondary | ICD-10-CM | POA: Diagnosis not present

## 2020-05-05 DIAGNOSIS — Z7982 Long term (current) use of aspirin: Secondary | ICD-10-CM | POA: Insufficient documentation

## 2020-05-05 DIAGNOSIS — Z79899 Other long term (current) drug therapy: Secondary | ICD-10-CM | POA: Diagnosis not present

## 2020-05-05 DIAGNOSIS — Z8673 Personal history of transient ischemic attack (TIA), and cerebral infarction without residual deficits: Secondary | ICD-10-CM | POA: Insufficient documentation

## 2020-05-05 DIAGNOSIS — I739 Peripheral vascular disease, unspecified: Secondary | ICD-10-CM

## 2020-05-05 DIAGNOSIS — I70229 Atherosclerosis of native arteries of extremities with rest pain, unspecified extremity: Secondary | ICD-10-CM | POA: Diagnosis present

## 2020-05-05 DIAGNOSIS — M109 Gout, unspecified: Secondary | ICD-10-CM | POA: Diagnosis not present

## 2020-05-05 DIAGNOSIS — E785 Hyperlipidemia, unspecified: Secondary | ICD-10-CM | POA: Insufficient documentation

## 2020-05-05 HISTORY — DX: Peripheral vascular disease, unspecified: I73.9

## 2020-05-05 HISTORY — PX: PERIPHERAL VASCULAR INTERVENTION: CATH118257

## 2020-05-05 HISTORY — PX: ABDOMINAL AORTOGRAM W/LOWER EXTREMITY: CATH118223

## 2020-05-05 HISTORY — PX: PERIPHERAL VASCULAR ATHERECTOMY: CATH118256

## 2020-05-05 LAB — GLUCOSE, CAPILLARY
Glucose-Capillary: 258 mg/dL — ABNORMAL HIGH (ref 70–99)
Glucose-Capillary: 256 mg/dL — ABNORMAL HIGH (ref 70–99)
Glucose-Capillary: 232 mg/dL — ABNORMAL HIGH (ref 70–99)
Glucose-Capillary: 398 mg/dL — ABNORMAL HIGH (ref 70–99)

## 2020-05-05 LAB — POCT ACTIVATED CLOTTING TIME
Activated Clotting Time: 237 seconds
Activated Clotting Time: 315 seconds
Activated Clotting Time: 291 seconds
Activated Clotting Time: 184 seconds
Activated Clotting Time: 196 seconds
Activated Clotting Time: 118 seconds

## 2020-05-05 SURGERY — ABDOMINAL AORTOGRAM W/LOWER EXTREMITY
Anesthesia: LOCAL | Laterality: Left

## 2020-05-05 MED ORDER — SODIUM CHLORIDE 0.9% FLUSH: 3.0000 mL | INTRAVENOUS | Status: DC | PRN

## 2020-05-05 MED ORDER — MIDAZOLAM HCL 2 MG/2ML IJ SOLN
INTRAMUSCULAR | Status: DC | PRN
  Administered 2020-05-05: 1 mg via INTRAVENOUS

## 2020-05-05 MED ORDER — ONDANSETRON HCL 4 MG/2ML IJ SOLN
4.0000 mg | Freq: Four times a day (QID) | INTRAMUSCULAR | Status: DC | PRN
  Administered 2020-05-06: 4 mg via INTRAVENOUS
  Filled 2020-05-05: qty 2

## 2020-05-05 MED ORDER — ASPIRIN 81 MG PO CHEW: 81.0000 mg | CHEWABLE_TABLET | ORAL | Status: DC

## 2020-05-05 MED ORDER — SODIUM CHLORIDE 0.9 % IV SOLN: 250.0000 mL | INTRAVENOUS | Status: DC | PRN

## 2020-05-05 MED ORDER — ONDANSETRON HCL 4 MG/2ML IJ SOLN
INTRAMUSCULAR | Status: AC
  Filled 2020-05-05: qty 2

## 2020-05-05 MED ORDER — MIDAZOLAM HCL 2 MG/2ML IJ SOLN
INTRAMUSCULAR | Status: AC
  Filled 2020-05-05: qty 2

## 2020-05-05 MED ORDER — HEPARIN SODIUM (PORCINE) 1000 UNIT/ML IJ SOLN
INTRAMUSCULAR | Status: DC | PRN
  Administered 2020-05-05: 10000 [IU] via INTRAVENOUS
  Administered 2020-05-05: 4000 [IU] via INTRAVENOUS

## 2020-05-05 MED ORDER — FENTANYL CITRATE (PF) 100 MCG/2ML IJ SOLN
INTRAMUSCULAR | Status: DC | PRN
  Administered 2020-05-05 (×3): 25 ug via INTRAVENOUS

## 2020-05-05 MED ORDER — VERAPAMIL HCL 2.5 MG/ML IV SOLN
INTRAVENOUS | Status: AC
  Filled 2020-05-05: qty 2

## 2020-05-05 MED ORDER — TRAMADOL HCL 50 MG PO TABS
50.0000 mg | ORAL_TABLET | Freq: Three times a day (TID) | ORAL | Status: DC
  Administered 2020-05-05 – 2020-05-06 (×2): 50 mg via ORAL
  Filled 2020-05-05 (×2): qty 1

## 2020-05-05 MED ORDER — CLOPIDOGREL BISULFATE 300 MG PO TABS
ORAL_TABLET | ORAL | Status: DC | PRN
  Administered 2020-05-05: 300 mg via ORAL

## 2020-05-05 MED ORDER — ASPIRIN 81 MG PO CHEW: 81.0000 mg | CHEWABLE_TABLET | Freq: Every day | ORAL | Status: DC

## 2020-05-05 MED ORDER — EMPAGLIFLOZIN-METFORMIN HCL 12.5-1000 MG PO TABS: 1.0000 | ORAL_TABLET | Freq: Two times a day (BID) | ORAL | Status: DC

## 2020-05-05 MED ORDER — CARVEDILOL 6.25 MG PO TABS
6.2500 mg | ORAL_TABLET | Freq: Two times a day (BID) | ORAL | Status: DC
  Administered 2020-05-06: 6.25 mg via ORAL
  Filled 2020-05-05: qty 1

## 2020-05-05 MED ORDER — FENTANYL CITRATE (PF) 100 MCG/2ML IJ SOLN
INTRAMUSCULAR | Status: AC
  Filled 2020-05-05: qty 2

## 2020-05-05 MED ORDER — CLOPIDOGREL BISULFATE 300 MG PO TABS
ORAL_TABLET | ORAL | Status: AC
  Filled 2020-05-05: qty 1

## 2020-05-05 MED ORDER — SODIUM CHLORIDE 0.9 % WEIGHT BASED INFUSION: 1.0000 mL/kg/h | INTRAVENOUS | Status: DC

## 2020-05-05 MED ORDER — METFORMIN HCL 500 MG PO TABS: 500.0000 mg | ORAL_TABLET | Freq: Two times a day (BID) | ORAL | Status: DC

## 2020-05-05 MED ORDER — LISINOPRIL 40 MG PO TABS
40.0000 mg | ORAL_TABLET | Freq: Every day | ORAL | Status: DC
  Administered 2020-05-06: 40 mg via ORAL
  Filled 2020-05-05: qty 2
  Filled 2020-05-05: qty 1

## 2020-05-05 MED ORDER — HEPARIN SODIUM (PORCINE) 1000 UNIT/ML IJ SOLN
INTRAMUSCULAR | Status: AC
  Filled 2020-05-05: qty 1

## 2020-05-05 MED ORDER — HYDRALAZINE HCL 20 MG/ML IJ SOLN: 5.0000 mg | INTRAMUSCULAR | Status: DC | PRN

## 2020-05-05 MED ORDER — CLOPIDOGREL BISULFATE 75 MG PO TABS
75.0000 mg | ORAL_TABLET | Freq: Every day | ORAL | Status: DC
  Filled 2020-05-05: qty 1

## 2020-05-05 MED ORDER — INSULIN ASPART 100 UNIT/ML ~~LOC~~ SOLN
0.0000 [IU] | Freq: Three times a day (TID) | SUBCUTANEOUS | Status: DC
  Administered 2020-05-05: 3 [IU] via SUBCUTANEOUS
  Administered 2020-05-06: 2 [IU] via SUBCUTANEOUS

## 2020-05-05 MED ORDER — HEPARIN (PORCINE) IN NACL 1000-0.9 UT/500ML-% IV SOLN
INTRAVENOUS | Status: DC | PRN
  Administered 2020-05-05 (×2): 500 mL

## 2020-05-05 MED ORDER — ASPIRIN EC 81 MG PO TBEC
81.0000 mg | DELAYED_RELEASE_TABLET | Freq: Every day | ORAL | Status: DC
  Administered 2020-05-06: 81 mg via ORAL
  Filled 2020-05-05: qty 1

## 2020-05-05 MED ORDER — NITROGLYCERIN IN D5W 200-5 MCG/ML-% IV SOLN
INTRAVENOUS | Status: AC
  Filled 2020-05-05: qty 250

## 2020-05-05 MED ORDER — PREGABALIN 25 MG PO CAPS
50.0000 mg | ORAL_CAPSULE | Freq: Two times a day (BID) | ORAL | Status: DC
  Administered 2020-05-05 – 2020-05-06 (×3): 50 mg via ORAL
  Filled 2020-05-05: qty 1
  Filled 2020-05-05 (×2): qty 2

## 2020-05-05 MED ORDER — PREDNISONE 20 MG PO TABS
20.0000 mg | ORAL_TABLET | Freq: Two times a day (BID) | ORAL | Status: DC
  Administered 2020-05-05 – 2020-05-06 (×2): 20 mg via ORAL
  Filled 2020-05-05 (×3): qty 1

## 2020-05-05 MED ORDER — MORPHINE SULFATE (PF) 2 MG/ML IV SOLN
2.0000 mg | INTRAVENOUS | Status: DC | PRN
Start: 2020-05-05 — End: 2020-05-06
  Administered 2020-05-05 – 2020-05-06 (×8): 2 mg via INTRAVENOUS
  Filled 2020-05-05 (×7): qty 1

## 2020-05-05 MED ORDER — IODIXANOL 320 MG/ML IV SOLN
INTRAVENOUS | Status: DC | PRN
  Administered 2020-05-05: 250 mL

## 2020-05-05 MED ORDER — CLOPIDOGREL BISULFATE 75 MG PO TABS: 75.0000 mg | ORAL_TABLET | ORAL | Status: DC

## 2020-05-05 MED ORDER — SODIUM CHLORIDE 0.9% FLUSH
3.0000 mL | Freq: Two times a day (BID) | INTRAVENOUS | Status: DC
  Administered 2020-05-06: 3 mL via INTRAVENOUS

## 2020-05-05 MED ORDER — SODIUM CHLORIDE 0.9 % IV SOLN: INTRAVENOUS | Status: AC

## 2020-05-05 MED ORDER — MORPHINE SULFATE (PF) 2 MG/ML IV SOLN
INTRAVENOUS | Status: AC
  Filled 2020-05-05: qty 1

## 2020-05-05 MED ORDER — EMPAGLIFLOZIN 10 MG PO TABS: 12.5000 mg | ORAL_TABLET | Freq: Two times a day (BID) | ORAL | Status: DC

## 2020-05-05 MED ORDER — ASPIRIN 81 MG PO CHEW
CHEWABLE_TABLET | ORAL | Status: AC
  Filled 2020-05-05: qty 1

## 2020-05-05 MED ORDER — HEPARIN (PORCINE) IN NACL 1000-0.9 UT/500ML-% IV SOLN
INTRAVENOUS | Status: AC
  Filled 2020-05-05: qty 500

## 2020-05-05 MED ORDER — LABETALOL HCL 5 MG/ML IV SOLN: 10.0000 mg | INTRAVENOUS | Status: DC | PRN

## 2020-05-05 MED ORDER — METFORMIN HCL 500 MG PO TABS
500.0000 mg | ORAL_TABLET | Freq: Two times a day (BID) | ORAL | Status: DC
  Filled 2020-05-05: qty 1

## 2020-05-05 MED ORDER — INSULIN GLARGINE 100 UNIT/ML ~~LOC~~ SOLN
75.0000 [IU] | Freq: Every day | SUBCUTANEOUS | Status: DC
  Administered 2020-05-06: 75 [IU] via SUBCUTANEOUS
  Filled 2020-05-05: qty 0.75

## 2020-05-05 MED ORDER — LIDOCAINE HCL (PF) 1 % IJ SOLN
INTRAMUSCULAR | Status: AC
  Filled 2020-05-05: qty 30

## 2020-05-05 MED ORDER — EMPAGLIFLOZIN 10 MG PO TABS
10.0000 mg | ORAL_TABLET | Freq: Two times a day (BID) | ORAL | Status: DC
  Administered 2020-05-05 – 2020-05-06 (×2): 10 mg via ORAL
  Filled 2020-05-05 (×4): qty 1

## 2020-05-05 MED ORDER — CLOPIDOGREL BISULFATE 75 MG PO TABS
75.0000 mg | ORAL_TABLET | Freq: Every day | ORAL | Status: DC
  Administered 2020-05-06: 75 mg via ORAL
  Filled 2020-05-05: qty 1

## 2020-05-05 MED ORDER — SODIUM CHLORIDE 0.9 % WEIGHT BASED INFUSION
3.0000 mL/kg/h | INTRAVENOUS | Status: DC
  Administered 2020-05-05: 3 mL/kg/h via INTRAVENOUS

## 2020-05-05 MED ORDER — ONDANSETRON HCL 4 MG/2ML IJ SOLN
INTRAMUSCULAR | Status: DC | PRN
  Administered 2020-05-05: 4 mg via INTRAVENOUS

## 2020-05-05 MED ORDER — ALLOPURINOL 100 MG PO TABS
100.0000 mg | ORAL_TABLET | Freq: Every day | ORAL | Status: DC
  Administered 2020-05-05 – 2020-05-06 (×2): 100 mg via ORAL
  Filled 2020-05-05 (×2): qty 1

## 2020-05-05 MED ORDER — ACETAMINOPHEN 325 MG PO TABS
650.0000 mg | ORAL_TABLET | ORAL | Status: DC | PRN
  Administered 2020-05-05: 650 mg via ORAL
  Filled 2020-05-05: qty 2

## 2020-05-05 MED ORDER — LIDOCAINE HCL (PF) 1 % IJ SOLN
INTRAMUSCULAR | Status: DC | PRN
  Administered 2020-05-05: 24 mL

## 2020-05-05 MED ORDER — SODIUM CHLORIDE 0.9% FLUSH
3.0000 mL | INTRAVENOUS | Status: DC | PRN
  Administered 2020-05-05: 3 mL via INTRAVENOUS

## 2020-05-05 MED ORDER — ROSUVASTATIN CALCIUM 20 MG PO TABS: 20.0000 mg | ORAL_TABLET | Freq: Every day | ORAL | Status: DC

## 2020-05-05 MED ORDER — NITROGLYCERIN 1 MG/10 ML FOR IR/CATH LAB
INTRA_ARTERIAL | Status: DC | PRN
  Administered 2020-05-05 (×3): 200 ug

## 2020-05-05 MED ORDER — ATORVASTATIN CALCIUM 80 MG PO TABS: 80.0000 mg | ORAL_TABLET | Freq: Every day | ORAL | Status: DC

## 2020-05-05 SURGICAL SUPPLY — 33 items
BALLN CHOCOLATE 3.5X80X135 (BALLOONS) ×2
BALLN COYOTE OTW 2.5X60X150 (BALLOONS) ×2
BALLOON CHOCOLATE 3.5X80X135 (BALLOONS) IMPLANT
BALLOON COYOTE OTW 2.5X60X150 (BALLOONS) IMPLANT
CATH ANGIO 5F PIGTAIL 65CM (CATHETERS) ×1 IMPLANT
CATH CROSS OVER TEMPO 5F (CATHETERS) ×1 IMPLANT
CATH CXI 2.3F 150 ST (CATHETERS) ×1 IMPLANT
CATH QUICKCROSS SUPP .035X90CM (MICROCATHETER) ×1 IMPLANT
CATH STRAIGHT 5FR 65CM (CATHETERS) ×1 IMPLANT
CROWN STEALTH MICRO-30 1.25MM (CATHETERS) ×1 IMPLANT
DIAMONDBACK CLASSIC OAS 1.5MM (CATHETERS) ×2
GUIDEWIRE ZILIENT 6G 014 (WIRE) ×1 IMPLANT
KIT ENCORE 26 ADVANTAGE (KITS) ×1 IMPLANT
KIT ESSENTIALS PG (KITS) ×1 IMPLANT
KIT PV (KITS) ×2 IMPLANT
LUBRICANT VIPERSLIDE CORONARY (MISCELLANEOUS) ×1 IMPLANT
SHEATH HIGHFLEX ANSEL 7FR 55CM (SHEATH) ×1 IMPLANT
SHEATH PINNACLE 5F 10CM (SHEATH) ×1 IMPLANT
SHEATH PINNACLE 7F 10CM (SHEATH) ×1 IMPLANT
SHEATH PINNACLE 8F 10CM (SHEATH) ×1 IMPLANT
SHEATH PROBE COVER 6X72 (BAG) ×1 IMPLANT
STENT SYNERGY XD 4.0X48 (Permanent Stent) IMPLANT
STOPCOCK MORSE 400PSI 3WAY (MISCELLANEOUS) ×1 IMPLANT
SYNERGY XD 4.0X48 (Permanent Stent) ×2 IMPLANT
SYR MEDRAD MARK 7 150ML (SYRINGE) ×2 IMPLANT
SYSTEM DIMNDBCK CLSC OAS 1.5MM (CATHETERS) IMPLANT
TAPE VIPERTRACK RADIOPAQ (MISCELLANEOUS) IMPLANT
TAPE VIPERTRACK RADIOPAQUE (MISCELLANEOUS) ×2
TRANSDUCER W/STOPCOCK (MISCELLANEOUS) ×2 IMPLANT
TRAY PV CATH (CUSTOM PROCEDURE TRAY) ×2 IMPLANT
TUBING CIL FLEX 10 FLL-RA (TUBING) ×1 IMPLANT
WIRE HITORQ VERSACORE ST 145CM (WIRE) ×1 IMPLANT
WIRE VIPER WIRECTO 0.014 (WIRE) ×1 IMPLANT

## 2020-05-05 NOTE — Progress Notes (Signed)
Pt attempting to urinate with urinal, bed in  reverse trendelenburg position, sheath remains in place to right groin with pressure bag, unable to void, pt refused rn to call doctor for I/O cath, safety maintained

## 2020-05-05 NOTE — Interval H&P Note (Signed)
History and Physical Interval Note:  05/05/2020 8:49 AM  Lawrence Maynard  has presented today for surgery, with the diagnosis of claudication.  The various methods of treatment have been discussed with the patient and family. After consideration of risks, benefits and other options for treatment, the patient has consented to  Procedure(s): ABDOMINAL AORTOGRAM W/LOWER EXTREMITY (N/A) as a surgical intervention.  The patient's history has been reviewed, patient examined, no change in status, stable for surgery.  I have reviewed the patient's chart and labs.  Questions were answered to the patient's satisfaction.     Nanetta Batty

## 2020-05-06 ENCOUNTER — Encounter (HOSPITAL_COMMUNITY): Payer: Self-pay | Admitting: Cardiovascular Disease

## 2020-05-06 ENCOUNTER — Other Ambulatory Visit (HOSPITAL_COMMUNITY): Payer: Self-pay | Admitting: Physician Assistant

## 2020-05-06 DIAGNOSIS — Z7902 Long term (current) use of antithrombotics/antiplatelets: Secondary | ICD-10-CM | POA: Diagnosis not present

## 2020-05-06 DIAGNOSIS — E782 Mixed hyperlipidemia: Secondary | ICD-10-CM

## 2020-05-06 DIAGNOSIS — I70222 Atherosclerosis of native arteries of extremities with rest pain, left leg: Secondary | ICD-10-CM | POA: Diagnosis not present

## 2020-05-06 DIAGNOSIS — M109 Gout, unspecified: Secondary | ICD-10-CM

## 2020-05-06 DIAGNOSIS — Z8673 Personal history of transient ischemic attack (TIA), and cerebral infarction without residual deficits: Secondary | ICD-10-CM | POA: Diagnosis not present

## 2020-05-06 DIAGNOSIS — I70229 Atherosclerosis of native arteries of extremities with rest pain, unspecified extremity: Secondary | ICD-10-CM | POA: Diagnosis not present

## 2020-05-06 DIAGNOSIS — Z794 Long term (current) use of insulin: Secondary | ICD-10-CM

## 2020-05-06 DIAGNOSIS — I10 Essential (primary) hypertension: Secondary | ICD-10-CM | POA: Diagnosis not present

## 2020-05-06 DIAGNOSIS — E119 Type 2 diabetes mellitus without complications: Secondary | ICD-10-CM

## 2020-05-06 DIAGNOSIS — E1151 Type 2 diabetes mellitus with diabetic peripheral angiopathy without gangrene: Secondary | ICD-10-CM | POA: Diagnosis not present

## 2020-05-06 DIAGNOSIS — Z791 Long term (current) use of non-steroidal anti-inflammatories (NSAID): Secondary | ICD-10-CM | POA: Diagnosis not present

## 2020-05-06 DIAGNOSIS — M1A0790 Idiopathic chronic gout, unspecified ankle and foot, without tophus (tophi): Secondary | ICD-10-CM

## 2020-05-06 DIAGNOSIS — Z7982 Long term (current) use of aspirin: Secondary | ICD-10-CM | POA: Diagnosis not present

## 2020-05-06 DIAGNOSIS — E785 Hyperlipidemia, unspecified: Secondary | ICD-10-CM | POA: Diagnosis not present

## 2020-05-06 DIAGNOSIS — I739 Peripheral vascular disease, unspecified: Secondary | ICD-10-CM

## 2020-05-06 DIAGNOSIS — Z79899 Other long term (current) drug therapy: Secondary | ICD-10-CM | POA: Diagnosis not present

## 2020-05-06 LAB — CBC
HCT: 41.7 % (ref 39.0–52.0)
Hemoglobin: 13.2 g/dL (ref 13.0–17.0)
MCH: 27.3 pg (ref 26.0–34.0)
MCHC: 31.7 g/dL (ref 30.0–36.0)
MCV: 86.2 fL (ref 80.0–100.0)
Platelets: 191 10*3/uL (ref 150–400)
RBC: 4.84 MIL/uL (ref 4.22–5.81)
RDW: 12.9 % (ref 11.5–15.5)
WBC: 7.7 10*3/uL (ref 4.0–10.5)
nRBC: 0 % (ref 0.0–0.2)

## 2020-05-06 LAB — BASIC METABOLIC PANEL
Anion gap: 11 (ref 5–15)
BUN: 34 mg/dL — ABNORMAL HIGH (ref 6–20)
CO2: 20 mmol/L — ABNORMAL LOW (ref 22–32)
Calcium: 9 mg/dL (ref 8.9–10.3)
Chloride: 105 mmol/L (ref 98–111)
Creatinine, Ser: 1.2 mg/dL (ref 0.61–1.24)
GFR, Estimated: >60 mL/min (ref >60)
Glucose, Bld: 284 mg/dL — ABNORMAL HIGH (ref 70–99)
Potassium: 4.8 mmol/L (ref 3.5–5.1)
Sodium: 136 mmol/L (ref 135–145)

## 2020-05-06 LAB — GLUCOSE, CAPILLARY: Glucose-Capillary: 194 mg/dL — ABNORMAL HIGH (ref 70–99)

## 2020-05-06 MED ORDER — COLCHICINE 0.6 MG PO TABS: 0.6000 mg | ORAL_TABLET | Freq: Every day | ORAL | 0 refills | Status: DC | PRN

## 2020-05-06 MED ORDER — COLCHICINE 0.6 MG PO TABS
0.6000 mg | ORAL_TABLET | Freq: Once | ORAL | Status: AC
  Administered 2020-05-06: 0.6 mg via ORAL
  Filled 2020-05-06: qty 1

## 2020-05-06 MED ORDER — PREDNISONE 20 MG PO TABS: 20.0000 mg | ORAL_TABLET | Freq: Two times a day (BID) | ORAL | 0 refills | Status: DC

## 2020-05-06 MED FILL — Verapamil HCl IV Soln 2.5 MG/ML: INTRAVENOUS | Qty: 2 | Status: AC

## 2020-05-06 NOTE — Progress Notes (Signed)
Pt's stable, to be DC'ed home.

## 2020-05-06 NOTE — Discharge Summary (Addendum)
Discharge Summary    Patient ID: Lawrence Maynard MRN: [id number]; DOB: [birth year]  Admit date: 05/05/2020 Discharge date: 05/06/2020  Primary Care Provider: Marliss Coots, NP  Primary Cardiologist: Quay Burow, MD  Primary Electrophysiologist:  None   Discharge Diagnoses    Principal Problem:   Critical lower limb ischemia [facility]) Active Problems:   Essential hypertension   Hyperlipidemia   Peripheral arterial disease (HCC)   Insulin dependent type 2 diabetes mellitus ([place])   Gout   Diagnostic Studies/Procedures    PV Angio 05/05/20 Pre Procedure Diagnosis: Critical limb ischemia/rest pain left  Post Procedure Diagnosis: Critical limb ischemia/rest pain left  Operators: Dr. Quay Burow  Procedures Performed:               1.  Ultrasound-guided right common femoral access               2.  Abdominal aortogram/bilateral iliac angiogram               3.  Contralateral access (second order catheter placement)               4.  Left lower extremity angiography with runoff               5.  Diamondback orbital rotational arthrectomy left TP trunk               6.  PTA and drug-eluting stenting left TP trunk  PROCEDURE DESCRIPTION:   The patient was brought to the second floor [address] Cardiac cath lab in the the postabsorptive state. He was premedicated with IV Versed and fentanyl. His right groin was prepped and shaved in usual sterile fashion. Xylocaine 1% was used for local anesthesia. A 5 French sheath was inserted into the right common femoral artery using standard Seldinger technique.  Ultrasound was used to identify the right common femoral artery and guide access.  A digital image was captured and placed the patient's chart.  A 5 French pigtail catheters placed the distal abdominal aorta.  Distal abdominal aortography, and bilateral iliac angiography were performed.  Contralateral access was obtained with a crossover catheter and an 035 versa core wire along with an  endhole catheter.  Left lower extremity angiography runoff was performed using bolus chase, digital subtraction and step table technique.  Omnipaque dye is used for the entirety of the case.  Retrograde aortic pressures monitored during the case.    Angiographic Data:   1: Abdominal aorta-renal arteries widely patent.  The infrarenal abdominal aorta was free of significant disease. 2: Right lower extremity-the right iliac artery was widely patent 3: Left lower extremity-left iliac artery, common femoral and SFA were widely patent.  Left popliteal was patent down to below the knee which point it was occluded as was the anterior tibial and tibioperoneal trunk.  IMPRESSION: Mr. Shipper has resting limb pain with occluded below the knee popliteal artery, AT, PT and tibioperoneal trunk.  We will proceed with attempt at crossing and revascularization to provide inline flow.  Procedure Description: A 7 French 55 cm multipurpose Ansell sheath was then advanced over the versa core wire across the bifurcation into the left SFA (third order catheter placement).  Patient received a total of 14,000s of heparin with an ACT of 291.  Total contrast administered the patient was 250 cc.  He was already on aspirin Plavix and received an additional 300 mg of Plavix at the end of the case.  I was able to cross the  distal left popliteal into the TP trunk and posterior tibial artery using a long 014 CXI endhole catheter along with a 6 g Zilient wire.  I then exchanged for an 014 Viper wire and performed orbital atherectomy with a 1.25 mm plastic bur upgrading to 1.5 mm classic bur.  Following this I performed balloon angioplasty with a 2.5 x 60 mm long Sterling balloon upgrading to a 3.5 x 80 mm long chocolate balloon.  I then implanted a 4 mm x 40 mm Synergy drug-eluting stent across the distal popliteal artery into the TP trunk and into the peroneal artery deployed at 12 to 14 atm resulting in a widely patent vessel and  inline flow down to the ankle.  The distal PT and dorsalis pedis feel by collaterals.  There was a dopplerable pulse at the foot at the end of the case.  The Ansell sheath was then withdrawn across the bifurcation and exchanged over an 035 wire for a short 7 [place] sheath which was then secured in place.  Final Impression: Successful orbital atherectomy followed by PTA and drug-eluting stenting of the occluded below the knee popliteal artery, and tibioperoneal trunk into the peroneal artery with inline flow down to the ankle.  The sheath will be removed once ACT falls below 170 and pressure held.  Patient will be hydrated overnight, discharged home in the morning.  We will get lower extremity arterial Doppler studies in our [facility] line office next week and I will see him back 1 to 2 weeks thereafter.   Quay Burow. MD, [facility] 05/05/2020 11:14 AM  _____________   History of Present Illness     Lawrence Maynard is a 55 y.o. male with HTN, HLD, DM, gout, obesity, stroke with prior loop recorder (now explanted) who presented to the hospital for PV angiography. He was recently seen in the office with LLE claudication and abnormal dopplers recent diagnosis of PVD showing noncompressible ABIs with tibial disease on the left.  Hospital Course     He underwent PV angiogram with results as outlined above s/p Successful orbital atherectomy followed by PTA and drug-eluting stenting of the occluded below the knee popliteal artery, and tibioperoneal trunk into the peroneal artery with inline flow down to the ankle. He did well overnight without complication. Dr. Irish Lack has seen and examined the patient today and feels he is stable for discharge. He has prescheduled LE doppers on 2/14 and then f/u arranged 2/25 with Dr. Gwenlyn Found. He was advised to hold metformin for 2 days post cath and resume 05/08/20. He also requested a letter specifying that he had been receiving his home Tramadol and IV morphine in the hospital since  he will have to do drug testing through his work. He also requested a letter informing work that he has a duplex scheduled for 2/14 and that he may return to work in 1 week on 2/15 (had taken a week of vacation anyway for recovery).  Regarding his h/o gout, per discussion with MD, the patient was advised to avoid ibuprofen and indomethacin going forward given his concomitant prednisone and DAPT. He was advised to f/u PCP for further management of this - had recently been working with PCP on potentially starting colchicine but cost has been a challenge. Dr. Irish Lack granted OK to provide rx for colchicine and refill of prednisone at discharge as patient requested. Originally these were to be filled by TOC but cost of colchicine remained substantial so he was given GoodRx card to take to his  usual pharmacy. See discharge instructions below - patient advised to call primary care TODAY to discuss the ultimate duration of prednisone and follow-up plan for his gout.   Did the patient have an acute coronary syndrome (MI, NSTEMI, STEMI, etc) this admission?:  No                               Did the patient have a percutaneous coronary intervention (stent / angioplasty)?:  No  _____________  Discharge Vitals Blood pressure 123/75, pulse 79, temperature 97.8 F (36.6 C), temperature source Oral, resp. rate 16, height _0  (1.702 m), weight 103.9 kg, SpO2 98 %.  Filed Weights   05/05/20 0808  Weight: 103.9 kg  Exam per Md General: Well developed, well nourished M in no acute distress. Head: Normocephalic, atraumatic, sclera non-icteric, no xanthomas, nares are without discharge. Neck: Negative for carotid bruits. JVP not elevated. Lungs: Clear bilaterally to auscultation without wheezes, rales, or rhonchi. Breathing is unlabored. Heart: RRR S1 S2 without murmurs, rubs, or gallops.  Abdomen: Soft, non-tender, non-distended with normoactive bowel sounds. No rebound/guarding. Extremities: No clubbing or  cyanosis. No edema. Distal pedal pulses are 2+ and equal bilaterally. Right radial cath site without hematoma or ecchymosis; bilateral pulses dopplerable Neuro: Alert and oriented X 3. Moves all extremities spontaneously. Psych:  Responds to questions appropriately with a normal affect.   Labs & Radiologic Studies    CBC Recent Labs    05/06/20 0218  WBC 7.7  HGB 13.2  HCT 41.7  MCV 86.2  PLT 409   Basic Metabolic Panel Recent Labs    05/06/20 0218  NA 136  K 4.8  CL 105  CO2 20*  GLUCOSE 284*  BUN 34*  CREATININE 1.20  CALCIUM 9.0   _____________  PERIPHERAL VASCULAR CATHETERIZATION  Result Date: 05/05/2020  [id number] LOCATION:  FACILITY: [facility] PHYSICIAN: Quay Burow, M.D. 1965/09/05 DATE OF PROCEDURE:  05/05/2020 DATE OF DISCHARGE: Care History obtained from chart review.Jesper Stirewalt is a 55 y.o. mild to moderately overweight separated African-American male father of 2 daughters, grandfather to 2 grandchildren referred by Dr. Felisa Bonier for peripheral vascular valuation because of resting foot pain.  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  He recently relocated from [facility] to [place].  He has had a stroke in the past and had a loop recorder implanted and explanted without demonstration of an arrhythmia.  He does have treated hypertension, diabetes and hyperlipidemia.  He denies chest pain.  He has had left foot pain and left calf claudication which is gotten worse over the last 6 months now to the point of rest pain.  Recent Dopplers performed today revealed noncompressible ABIs with tibial disease on the left. Pre Procedure Diagnosis: Critical limb ischemia/rest pain left Post Procedure Diagnosis: Critical limb ischemia/rest pain left Operators: Dr. Quay Burow Procedures Performed:  1.  Ultrasound-guided right common femoral access  2.  Abdominal aortogram/bilateral iliac angiogram  3.  Contralateral access (second order catheter placement)  4.  Left lower  extremity angiography with runoff  5.  Diamondback orbital rotational arthrectomy left TP trunk  6.  PTA and drug-eluting stenting left TP trunk PROCEDURE DESCRIPTION: The patient was brought to the second floor [place] [facility]iac cath lab in the the postabsorptive state. He was premedicated with IV Versed and fentanyl. His right groin was prepped and shaved in usual sterile fashion. Xylocaine 1% was used for local anesthesia. A  5 French sheath was inserted into the right common femoral artery using standard Seldinger technique.  Ultrasound was used to identify the right common femoral artery and guide access.  A digital image was captured and placed the patient's chart.  A 5 French pigtail catheters placed the distal abdominal aorta.  Distal abdominal aortography, and bilateral iliac angiography were performed.  Contralateral access was obtained with a crossover catheter and an 035 versa core wire along with an endhole catheter.  Left lower extremity angiography runoff was performed using bolus chase, digital subtraction and step table technique.  Omnipaque dye is used for the entirety of the case.  Retrograde aortic pressures monitored during the case.  Angiographic Data: 1: Abdominal aorta-renal arteries widely patent.  The infrarenal abdominal aorta was free of significant disease. 2: Right lower extremity-the right iliac artery was widely patent 3: Left lower extremity-left iliac artery, common femoral and SFA were widely patent.  Left popliteal was patent down to below the knee which point it was occluded as was the anterior tibial and tibioperoneal trunk.   Mr. Penton has resting limb pain with occluded below the knee popliteal artery, AT, PT and tibioperoneal trunk.  We will proceed with attempt at crossing and revascularization to provide inline flow. Procedure Description: A 7 French 55 cm multipurpose Ansell sheath was then advanced over the versa core wire across the bifurcation into the left SFA (third  order catheter placement).  Patient received a total of 14,000s of heparin with an ACT of 291.  Total contrast administered the patient was 250 cc.  He was already on aspirin Plavix and received an additional 300 mg of Plavix at the end of the case. I was able to cross the distal left popliteal into the TP trunk and posterior tibial artery using a long 014 CXI endhole catheter along with a 6 g Zilient wire.  I then exchanged for an 014 Viper wire and performed orbital atherectomy with a 1.25 mm plastic bur upgrading to 1.5 mm classic bur.  Following this I performed balloon angioplasty with a 2.5 x 60 mm long Sterling balloon upgrading to a 3.5 x 80 mm long chocolate balloon.  I then implanted a 4 mm x 40 mm Synergy drug-eluting stent across the distal popliteal artery into the TP trunk and into the peroneal artery deployed at 12 to 14 atm resulting in a widely patent vessel and inline flow down to the ankle.  The distal PT and dorsalis pedis feel by collaterals.  There was a dopplerable pulse at the foot at the end of the case.  The Ansell sheath was then withdrawn across the bifurcation and exchanged over an 035 wire for a short 7 [place] sheath which was then secured in place. Final Impression: Successful orbital atherectomy followed by PTA and drug-eluting stenting of the occluded below the knee popliteal artery, and tibioperoneal trunk into the peroneal artery with inline flow down to the ankle.  The sheath will be removed once ACT falls below 170 and pressure held.  Patient will be hydrated overnight, discharged home in the morning.  We will get lower extremity arterial Doppler studies in our [facility] line office next week and I will see him back 1 to 2 weeks thereafter. Quay Burow. MD, [facility] 05/05/2020 11:14 AM   DG Foot 2 Views Left  Result Date: 04/28/2020 Please see detailed radiograph report in office note.  DG Foot 2 Views Right  Result Date: 04/28/2020 Please see detailed radiograph report in  office  note.  VAS [place] LOWER EXT ART SEG MULTI (SEGMENTALS & LE RAYNAUDS)  Result Date: 04/30/2020 LOWER EXTREMITY DOPPLER STUDY Indications: Patient reports left leg claudication symptoms at various distances              x 2 years. The pain starts at the calf radiating to the entire              foot. Both feet are cold, left worse than right. Symptoms have              worsen since July 2021. He also experiences severe left leg              cramping at rest. He denies any claudication symptoms or rest pain.              Patient also has a history of gout. High Risk Factors: Hypertension, Diabetes, no history of smoking.  Comparison Study: NA Performing Technologist: Sharlett Iles RVT  Examination Guidelines: A complete evaluation includes at minimum, Doppler waveform signals and systolic blood pressure reading at the level of bilateral brachial, anterior tibial, and posterior tibial arteries, when vessel segments are accessible. Bilateral testing is considered an integral part of a complete examination. Photoelectric Plethysmograph (PPG) waveforms and toe systolic pressure readings are included as required and additional duplex testing as needed. Limited examinations for reoccurring indications may be performed as noted.  ABI Findings: +---------+------------------+-----+-----------+--------+ Right    Rt Pressure (mmHg)IndexWaveform   Comment  +---------+------------------+-----+-----------+--------+ Brachial 138                                        +---------+------------------+-----+-----------+--------+ CFA                             triphasic           +---------+------------------+-----+-----------+--------+ Popliteal                       triphasic           +---------+------------------+-----+-----------+--------+ ATA      254               1.76 biphasic            +---------+------------------+-----+-----------+--------+ PTA      146               1.01 biphasic    dampened +---------+------------------+-----+-----------+--------+ PERO     254               1.76 multiphasic         +---------+------------------+-----+-----------+--------+ Great Toe84                0.58 Normal              +---------+------------------+-----+-----------+--------+ +---------+------------------+-----+-------------------+-------+ Left     Lt Pressure (mmHg)IndexWaveform           Comment +---------+------------------+-----+-------------------+-------+ Brachial 144                                               +---------+------------------+-----+-------------------+-------+ CFA  multiphasic                +---------+------------------+-----+-------------------+-------+ Popliteal                       biphasic                   +---------+------------------+-----+-------------------+-------+ ATA      254               1.76 dampened monophasic        +---------+------------------+-----+-------------------+-------+ PTA      84                0.58 monophasic                 +---------+------------------+-----+-------------------+-------+ PERO     254               1.76 monophasic                 +---------+------------------+-----+-------------------+-------+ Great Toe0                 0.00 Absent                     +---------+------------------+-----+-------------------+-------+ +-------+----------------+-----------+------------+------------+ ABI/TBIToday's ABI     Today's TBIPrevious ABIPrevious TBI +-------+----------------+-----------+------------+------------+ Right  non-compressible.58                                 +-------+----------------+-----------+------------+------------+ Left   non-compressible0.0                                 +-------+----------------+-----------+------------+------------+ TOES Findings: +----------+---------------+--------+-------+ Right ToesPressure  (mmHg)WaveformComment +----------+---------------+--------+-------+ 1st Digit                Normal          +----------+---------------+--------+-------+ 2nd Digit                AbnormalMild    +----------+---------------+--------+-------+ 3rd Digit                Normal          +----------+---------------+--------+-------+ 4th Digit                Normal          +----------+---------------+--------+-------+ 5th Digit                AbnormalSever   +----------+---------------+--------+-------+  +---------+---------------+--------+-------------------------+ Left ToesPressure (mmHg)WaveformComment                   +---------+---------------+--------+-------------------------+ 1st Digit               Absent                            +---------+---------------+--------+-------------------------+ 2nd Digit               AbnormalSevere, nearly flat lined +---------+---------------+--------+-------------------------+ 3rd Digit               AbnormalSevere, nearly flat lined +---------+---------------+--------+-------------------------+ 4th Digit               AbnormalSever, nearly flat lined  +---------+---------------+--------+-------------------------+ 5th Digit               Absent                            +---------+---------------+--------+-------------------------+  Summary: Right: Resting right ankle-brachial index indicates noncompressible right lower extremity arteries. The right toe-brachial index is abnormal. Left: Resting left ankle-brachial index indicates noncompressible left lower extremity arteries. The left toe-brachial index is abnormal.  *See table(s) above for measurements and observations. See LE Arterial duplex report. Vascular consult recommended. Electronically signed by Ena Dawley MD on 04/30/2020 at 11:23:47 AM.    Final    VAS [place] LOWER EXTREMITY ARTERIAL DUPLEX  Result Date: 04/30/2020 LOWER EXTREMITY ARTERIAL DUPLEX STUDY  Indications: Patient reports left leg claudication symptoms at various distances              x 2 years. The pain starts at the calf radiating to the entire              foot. Both feet are cold, left worse than right. Symptoms have              worsen since July 2021. He also experiences severe left leg              cramping at rest. He denies any claudication symptoms or rest pain.              Patient also has a history of gout. High Risk Factors: Hypertension, Diabetes, no history of smoking.  Current ABI: non-compressible, bilaterally Comparison Study: NA Performing Technologist: Sharlett Iles RVT  Examination Guidelines: A complete evaluation includes B-mode imaging, spectral Doppler, color Doppler, and power Doppler as needed of all accessible portions of each vessel. Bilateral testing is considered an integral part of a complete examination. Limited examinations for reoccurring indications may be performed as noted.  +-----------+--------+-----+--------+----------------+-------------------------+ RIGHT      PSV cm/sRatioStenosisWaveform        Comments                  +-----------+--------+-----+--------+----------------+-------------------------+ CFA Prox   83                   biphasic                                  +-----------+--------+-----+--------+----------------+-------------------------+ DFA        99                   biphasic        heterogenous plaque       +-----------+--------+-----+--------+----------------+-------------------------+ SFA Prox   73                   triphasic                                 +-----------+--------+-----+--------+----------------+-------------------------+ SFA Mid    95                   triphasic       heterogenous plaque and                                                   medial calcifications     +-----------+--------+-----+--------+----------------+-------------------------+ SFA Distal 56                    triphasic       medial calcifications     +-----------+--------+-----+--------+----------------+-------------------------+  POP Prox   71                   triphasic       medial calcifications     +-----------+--------+-----+--------+----------------+-------------------------+ POP Mid    65                   triphasic       medial calcifications     +-----------+--------+-----+--------+----------------+-------------------------+ POP Distal 46                   barely triphasicmedial calcifications     +-----------+--------+-----+--------+----------------+-------------------------+ TP Trunk   66                   biphasic        medial calcifications     +-----------+--------+-----+--------+----------------+-------------------------+ ATA Prox   54                   biphasic        medial calcifications     +-----------+--------+-----+--------+----------------+-------------------------+ ATA Mid    31                   biphasic        medial calcifications     +-----------+--------+-----+--------+----------------+-------------------------+ ATA Distal 30                   biphasic        medial calcifications     +-----------+--------+-----+--------+----------------+-------------------------+ PTA Prox   50                   biphasic        medial calcifications     +-----------+--------+-----+--------+----------------+-------------------------+ PTA Mid    33                   biphasic        medial calcifications     +-----------+--------+-----+--------+----------------+-------------------------+ PTA Distal 18                   biphasic        medial calcifications     +-----------+--------+-----+--------+----------------+-------------------------+ PERO Prox  47                   triphasic       heterogenous plaque and                                                   medial calcifications      +-----------+--------+-----+--------+----------------+-------------------------+ PERO Mid   50                   biphasic        medial calcifications     +-----------+--------+-----+--------+----------------+-------------------------+ PERO Distal47                   biphasic        medial calcifications     +-----------+--------+-----+--------+----------------+-------------------------+  +-----------+--------+-----+-------------+----------------+--------------------+ LEFT       PSV cm/sRatioStenosis     Waveform        Comments             +-----------+--------+-----+-------------+----------------+--------------------+ CFA Prox   140                       triphasic                            +-----------+--------+-----+-------------+----------------+--------------------+  DFA        141                       triphasic                            +-----------+--------+-----+-------------+----------------+--------------------+ SFA Prox   54                        triphasic       heterogenous plaque  +-----------+--------+-----+-------------+----------------+--------------------+ SFA Mid    101                       triphasic       medial                                                                    calcifications       +-----------+--------+-----+-------------+----------------+--------------------+ SFA Distal 85                        multiphasic     heterogenous plaque                                                       and medial                                                                calcifications       +-----------+--------+-----+-------------+----------------+--------------------+ POP Prox   44                        biphasic        medial                                                                    calcifications       +-----------+--------+-----+-------------+----------------+--------------------+ POP  Mid    40                        biphasic        medial                                                                    calcifications       +-----------+--------+-----+-------------+----------------+--------------------+ POP Distal 35  biphasic        medial                                                                    calcifications       +-----------+--------+-----+-------------+----------------+--------------------+ TP Trunk   342     3.7  50-74%       monophasic,     high end range                               stenosis     brisk                                +-----------+--------+-----+-------------+----------------+--------------------+ ATA Prox   37                        biphasic        medial                                                                    calcifications       +-----------+--------+-----+-------------+----------------+--------------------+ ATA Mid    15                        monophasic      medial                                                                    calcifications       +-----------+--------+-----+-------------+----------------+--------------------+ ATA Distal 12                        monophasic      medial                                                                    calcifications       +-----------+--------+-----+-------------+----------------+--------------------+ PTA Prox   17                        dampened        medial                                                    monophasic  calcifications       +-----------+--------+-----+-------------+----------------+--------------------+ PTA Mid    0            occluded                     medial                                                                    calcifications       +-----------+--------+-----+-------------+----------------+--------------------+ PTA  Distal 7                         dampened        medial                                                    monophasic      calcifications       +-----------+--------+-----+-------------+----------------+--------------------+ PERO Prox  29                        biphasic        medial                                                                    calcifications       +-----------+--------+-----+-------------+----------------+--------------------+ PERO Mid   25                        monophasic      medial                                                                    calcifications,                                                           mixed plaque noted                                                        with posterior  shadowing, cannot                                                         exclude high grade                                                        stenosis or short                                                         segment occlusion in                                                      area                 +-----------+--------+-----+-------------+----------------+--------------------+ PERO Distal36                        monophasic      medial                                                                    calcifications       +-----------+--------+-----+-------------+----------------+--------------------+ A focal velocity elevation of 341 cm/s was obtained at tibioperoneal trunk with post stenotic turbulence with a VR of 3.7. Findings are characteristic of 50-74% stenosis.  Summary: Right: Heterogenous plaque and medial calcifications noted. No evidence of significant obstruction is noted throughout the lower extremity. Three vessel run-off. Left: Heterogenous plaque and medial calcifications noted. 50-74%  stenosis in the distal TPT, high end range. There is at least one vessel run-off via ATA. Mid PTA occlusion is noted and there is posterior shadowing from plaque noted in the mid peroneal artery, cannot exclude high grade stenosis or short segment occlusion in this area.  See table(s) above for measurements and observations. See ABI report. Vascular consult recommended.  Electronically signed by Ena Dawley MD on 04/30/2020 at 11:25:59 AM.    Final    Disposition   Pt is being discharged home today in good condition.  Follow-up Plans & Appointments     Follow-up Information    [facility] Follow up.   Specialty: Cardiology Why: Please see appointments as listed below - you will have your repeat lower extremity arterial ultrasound on 2/14 at 3pm then follow-up with Dr. Gwenlyn Found on 2/25 at 11:45am. Contact information: Urania [facility]atawan Cutler, Audrea Muscat, NP Follow up.   Why: Please follow up with primary care provider within 1 week to discuss gout  management. Contact information: [address] [place] [address] [phone]              Discharge Instructions    Diet Carb Modified   Complete by: As directed    Discharge instructions   Complete by: As directed    IMPORTANT!!! You will need to stop your combination diabetic medication Iva Boop) that contains metformin for the 2 days after your heart cath. You can restart this on 05/08/20.  Patients taking blood thinners should generally stay away from medicines like indomethacin, ibuprofen, Advil, Motrin, naproxen, and Aleve due to risk of stomach bleeding. You may take Tylenol as directed or talk to your primary doctor about alternatives.  If you notice any bleeding such as blood in stool, black tarry stools, blood in urine, nosebleeds or any other unusual bleeding, call your doctor immediately. It is not normal to have this kind of bleeding  while on a blood thinner and usually indicates there is an underlying problem with one of your body systems that needs to be checked out.  You were sent in a short term prescription for your colchicine and prednisone by Dr. Irish Lack. Please call your primary care provider TODAY to discuss plan for further refills on this medicine. The prednisone prescription is limited to 10 days so please call your PCP today to discuss how long they want you on this medicine and at what dose. They may want to continue you on this medicine or taper off. It usually should not be stopped abruptly because of risk of rebound symptoms.   Increase activity slowly   Complete by: As directed    No driving for 2 days. No lifting over 5 lbs for 1 week. No sexual activity for 1 week. You may return to work on on 05/13/20 if you are feeling well . Keep procedure site clean & dry. If you notice increased pain, swelling, bleeding or pus, call/return!  You may shower, but no soaking baths/hot tubs/pools for 1 week.      Discharge Medications   Allergies as of 05/06/2020   No Known Allergies     Medication List    STOP taking these medications   ibuprofen 800 MG tablet Commonly known as: ADVIL   indomethacin 50 MG capsule Commonly known as: INDOCIN     TAKE these medications   allopurinol 100 MG tablet Commonly known as: ZYLOPRIM Take 100 mg by mouth daily.   aspirin 81 MG chewable tablet Chew 81 mg by mouth daily.   carvedilol 6.25 MG tablet Commonly known as: COREG Take 6.25 mg by mouth 2 (two) times daily with a meal.   colchicine 0.6 MG tablet Take 1 tablet (0.6 mg total) by mouth daily as needed (gout).   lisinopril 40 MG tablet Commonly known as: ZESTRIL Take 40 mg by mouth daily.   Plavix 75 MG tablet Generic drug: clopidogrel Take 75 mg by mouth daily.   predniSONE 20 MG tablet Commonly known as: DELTASONE Take 20 mg by mouth 2 (two) times daily.   pregabalin 50 MG capsule Commonly known as:  Lyrica Take 1 capsule (50 mg total) by mouth 2 (two) times daily.   rosuvastatin 20 MG tablet Commonly known as: CRESTOR Take 20 mg by mouth at bedtime.   sildenafil 50 MG tablet Commonly known as: VIAGRA Take 50 mg by mouth daily as needed for erectile dysfunction.   Synjardy 12.07-998 MG Tabs Generic drug: Empagliflozin-metFORMIN HCl Take 1 tablet by mouth 2 (two) times daily.  Notes to patient: You will need to stop your combination diabetic medication Iva Boop) that contains metformin for 2 days after your heart cath. You can restart this on 05/08/20.   Toujeo Max SoloStar 300 UNIT/ML Solostar Pen Generic drug: insulin glargine (2 Unit Dial) Inject 75 Units into the skin daily.   traMADol 50 MG tablet Commonly known as: Ultram Take 1 tablet (50 mg total) by mouth 3 (three) times daily.          Outstanding Labs/Studies   LE duplex planned as above  Duration of Discharge Encounter   Greater than 30 minutes including physician time.  Signed, Charlie Pitter, PA-C 05/06/2020, 11:15 AM   I have examined the patient and reviewed assessment and plan and discussed with patient.  Agree with above as stated.  Right groin stable.  Pedal pulses , DP bilaterally can be heard by Doppler.  Did well walking.  Of note, he has a drug test for a new job.  He likely received meds in the hospital that may trigger a positive tests for opiates.  He is aware and aske for a note stating such.  DAPT with aspirin and plavix.  Secondary prevention including lipid lowering therapy and avoiding tobacco.    Colchicine was given for h/o gout.  Avoid indomethacin given Plavix.  OK to use prednisone.   Larae Grooms

## 2020-05-12 ENCOUNTER — Other Ambulatory Visit: Payer: Self-pay

## 2020-05-12 ENCOUNTER — Ambulatory Visit (HOSPITAL_COMMUNITY)
Admission: RE | Admit: 2020-05-12 | Discharge: 2020-05-12 | Disposition: A | Discharge status: Home / Self Care | Payer: Self-pay | Source: Ambulatory Visit | Attending: Cardiology | Admitting: Cardiology

## 2020-05-12 DIAGNOSIS — I739 Peripheral vascular disease, unspecified: Secondary | ICD-10-CM | POA: Insufficient documentation

## 2020-05-13 ENCOUNTER — Encounter: Payer: Self-pay | Admitting: Cardiovascular Disease

## 2020-05-13 ENCOUNTER — Ambulatory Visit: Payer: BC Managed Care – PPO | Admitting: Cardiovascular Disease

## 2020-05-13 ENCOUNTER — Ambulatory Visit (INDEPENDENT_AMBULATORY_CARE_PROVIDER_SITE_OTHER): Payer: Self-pay | Admitting: Cardiovascular Disease

## 2020-05-13 VITALS — BP 135/81 | HR 91 | Ht 72.0 in | Wt 210.4 lb

## 2020-05-13 DIAGNOSIS — I739 Peripheral vascular disease, unspecified: Secondary | ICD-10-CM

## 2020-05-13 DIAGNOSIS — E782 Mixed hyperlipidemia: Secondary | ICD-10-CM

## 2020-05-13 NOTE — Progress Notes (Signed)
Cardiology Office Note   Date:  05/15/2020   ID:  Lawrence Maynard, DOB 08/23/1965, MRN [id number]  PCP:  Lavinia Sharps, NP  Cardiologist:  Dr. Allyson Sabal  No chief complaint on file.     History of Present Illness: Lawrence Maynard is a 55 y.o. male who is being seen urgently on behalf of Dr. Allyson Sabal due to unexpected finding of an occluded left popliteal artery stent that was placed recently. The patient has history of stroke, essential hypertension, type 2 diabetes and hyperlipidemia. He was seen recently for left calf claudication and left foot rest pain.  He underwent noninvasive vascular studies which showed noncompressible ABIs.  Duplex showed severe infrapopliteal disease.  He underwent an angiogram which showed no significant aortoiliac disease.  On the left side, there was no significant disease affecting the common femoral and SFA.  Below the knee popliteal artery was occluded into the proximal portion of the anterior tibial artery and tibioperoneal trunk.  The patient had orbital atherectomy, balloon angioplasty and placement of a 4.0 x 48 mm Synergy drug-eluting stent. He had vascular studies done yesterday which showed occluded stent and no change in ABI.  In addition, there was evidence of a hematoma at the stent placement location.  In spite of that, the patient has not had any calf or foot claudication.  There is no tenderness behind the knee.  He is mostly bothered by left big toe pain which she was having before related to gout.  He has no change in color. No chest pain or shortness of breath.  Past Medical History:  Diagnosis Date  . Diabetes mellitus without complication (HCC)   . Gout   . Hypertension   . Obesity   . PAD (peripheral artery disease) (HCC)     Past Surgical History:  Procedure Laterality Date  . ABDOMINAL AORTOGRAM W/LOWER EXTREMITY Left 05/05/2020   Procedure: ABDOMINAL AORTOGRAM W/LOWER EXTREMITY;  Surgeon: Runell Gess, MD;  Location: [facility] INVASIVE CV LAB;   Service: Cardiovascular;  Laterality: Left;  . PERIPHERAL VASCULAR ATHERECTOMY Left 05/05/2020   Procedure: PERIPHERAL VASCULAR ATHERECTOMY;  Surgeon: Runell Gess, MD;  Location: [facility] INVASIVE CV LAB;  Service: Cardiovascular;  Laterality: Left;  pt trunk  . PERIPHERAL VASCULAR INTERVENTION Left 05/05/2020   Procedure: PERIPHERAL VASCULAR INTERVENTION;  Surgeon: Runell Gess, MD;  Location: [facility] INVASIVE CV LAB;  Service: Cardiovascular;  Laterality: Left;  pt trunk     Current Outpatient Medications  Medication Sig Dispense Refill  . allopurinol (ZYLOPRIM) 100 MG tablet Take 100 mg by mouth daily.    Marland Kitchen aspirin 81 MG chewable tablet Chew 81 mg by mouth daily.    . carvedilol (COREG) 6.25 MG tablet Take 6.25 mg by mouth 2 (two) times daily with a meal.    . colchicine 0.6 MG tablet Take 1 tablet (0.6 mg total) by mouth daily as needed (gout). 30 tablet 0  . Empagliflozin-metFORMIN HCl (SYNJARDY) 12.07-998 MG TABS Take 1 tablet by mouth 2 (two) times daily.    . insulin glargine, 2 Unit Dial, (TOUJEO MAX SOLOSTAR) 300 UNIT/ML Solostar Pen Inject 75 Units into the skin daily.    Marland Kitchen lisinopril (ZESTRIL) 40 MG tablet Take 40 mg by mouth daily.    Marland Kitchen PLAVIX 75 MG tablet Take 75 mg by mouth daily.    . predniSONE (DELTASONE) 20 MG tablet Take 1 tablet (20 mg total) by mouth 2 (two) times daily. 20 tablet 0  . pregabalin (LYRICA) 50 MG  capsule Take 1 capsule (50 mg total) by mouth 2 (two) times daily. 30 capsule 0  . rosuvastatin (CRESTOR) 20 MG tablet Take 20 mg by mouth at bedtime.    . sildenafil (VIAGRA) 50 MG tablet Take 50 mg by mouth daily as needed for erectile dysfunction.    . traMADol (ULTRAM) 50 MG tablet Take 1 tablet (50 mg total) by mouth 3 (three) times daily. 90 tablet 2   Current Facility-Administered Medications  Medication Dose Route Frequency Provider Last Rate Last Admin  . triamcinolone acetonide (KENALOG) 10 MG/ML injection 10 mg  10 mg Other Once Lenn Sink, DPM         Allergies:   Patient has no known allergies.    Social History:  The patient  reports that he has never smoked. He has never used smokeless tobacco. He reports previous alcohol use.   Family History:  The patient's family history includes Diabetes in his father and mother; Gout in his father and mother; Hypertension in his father and mother.    ROS:  Please see the history of present illness.   Otherwise, review of systems are positive for none.   All other systems are reviewed and negative.    PHYSICAL EXAM: VS:  BP 135/81   Pulse 91   Ht 6' (1.829 m)   Wt 210 lb 6.4 oz (95.4 kg)   SpO2 98%   BMI 28.54 kg/m  , BMI Body mass index is 28.54 kg/m. GEN: Well nourished, well developed, in no acute distress  HEENT: normal  Neck: no JVD, carotid bruits, or masses Cardiac: RRR; no murmurs, rubs, or gallops,no edema  Respiratory:  clear to auscultation bilaterally, normal work of breathing GI: soft, nontender, nondistended, + BS MS: no deformity or atrophy  Skin: warm and dry, no rash Neuro:  Strength and sensation are intact Psych: euthymic mood, full affect Vascular: Femoral pulses normal bilaterally with no groin hematoma.  Left pedal pulses are not palpable.  No tenderness behind the knee.  No gangrene or skin changes.   EKG:  EKG is not ordered today.    Recent Labs: 05/06/2020: BUN 34; Creatinine, Ser 1.20; Hemoglobin 13.2; Platelets 191; Potassium 4.8; Sodium 136    Lipid Panel No results found for: CHOL, TRIG, HDL, CHOLHDL, VLDL, LDLCALC, LDLDIRECT    Wt Readings from Last 3 Encounters:  05/13/20 210 lb 6.4 oz (95.4 kg)  05/05/20 229 lb (103.9 kg)  04/29/20 211 lb 9.6 oz (96 kg)       No flowsheet data found.    ASSESSMENT AND PLAN:  1.  Peripheral arterial disease: Recent atherectomy and drug-eluting stent placement to the distal left popliteal artery into the TP trunk and peroneal artery.  Vascular studies yesterday surprisingly showed an occluded  stent with evidence of localized hematoma.  Suspect that the placement was subintimal although it was not really clear on the angiogram.  In spite of occlusion, the patient does not seem to be having symptoms at the present time.  Pre-stent placement he did have reasonable collaterals around occlusion and suspect that is the case now. Probably best to observe the patient for now and monitor for symptoms.  If revascularization is needed, he might need bypass but hopefully can be managed medically. He will follow up with Dr. Allyson Sabal in 1 week to ensure stability.  2.  Gout: He can use prednisone short-term.  3.  Hyperlipidemia: Continue rosuvastatin with a target LDL of less than 70.  5.  History of stroke: He is on dual antiplatelet therapy for that indication.    Disposition:   FU with Dr. Allyson Sabal in 1 week  Signed,  Lorine Bears, MD  05/15/2020 9:45 AM    [facility]

## 2020-05-13 NOTE — Patient Instructions (Signed)
Medication Instructions:  No changes *If you need a refill on your cardiac medications before your next appointment, please call your pharmacy*   Lab Work: None ordered If you have labs (blood work) drawn today and your tests are completely normal, you will receive your results only by: Marland Kitchen MyChart Message (if you have MyChart) OR . A paper copy in the mail If you have any lab test that is abnormal or we need to change your treatment, we will call you to review the results.   Testing/Procedures: None ordered   Follow-Up: At [facility], you and your health needs are our priority.  As part of our continuing mission to provide you with exceptional heart care, we have created designated Provider Care Teams.  These Care Teams include your primary Cardiologist (physician) and Advanced Practice Providers (APPs -  Physician Assistants and Nurse Practitioners) who all work together to provide you with the care you need, when you need it.  We recommend signing up for the patient portal called "MyChart".  Sign up information is provided on this After Visit Summary.  MyChart is used to connect with patients for Virtual Visits (Telemedicine).  Patients are able to view lab/test results, encounter notes, upcoming appointments, etc.  Non-urgent messages can be sent to your provider as well.   To learn more about what you can do with MyChart, go to [email or web address].    Your next appointment:   Keep your follow up with Dr. Allyson Sabal at 11 am on 05/23/20

## 2020-05-15 ENCOUNTER — Ambulatory Visit (INDEPENDENT_AMBULATORY_CARE_PROVIDER_SITE_OTHER): Payer: Self-pay | Admitting: Podiatry

## 2020-05-15 ENCOUNTER — Other Ambulatory Visit: Payer: Self-pay

## 2020-05-15 ENCOUNTER — Encounter: Payer: Self-pay | Admitting: Podiatry

## 2020-05-15 DIAGNOSIS — M779 Enthesopathy, unspecified: Secondary | ICD-10-CM

## 2020-05-15 DIAGNOSIS — I999 Unspecified disorder of circulatory system: Secondary | ICD-10-CM

## 2020-05-15 MED ORDER — TRIAMCINOLONE ACETONIDE 10 MG/ML IJ SUSP
10.0000 mg | Freq: Once | INTRAMUSCULAR | Status: AC
  Administered 2020-05-15: 10 mg

## 2020-05-16 ENCOUNTER — Ambulatory Visit: Payer: Self-pay | Admitting: Family

## 2020-05-18 NOTE — Progress Notes (Signed)
Subjective:   Patient ID: Lawrence Maynard, male   DOB: 55 y.o.   MRN: [id number]   HPI Patient states that he had a stent put in his left leg but he is still having a lot of pain in the to the big toe joint and is desperate for some short-term relief if possible    ROS      Objective:  Physical Exam  Neurovascular status was found to be unchanged with somewhat increased warmth to the foot but most likely the perfusion has not completely taken effect yet.  Patient is noted to have inflammation pain around the first MPJ left     Assessment:  Probability that this is related to vascular disease and I educated patient on vascular disease at its relationship to discomfort.  At this point of the work around the joint again and I did do sterile prep and I injected around the first MPJ 3 mg dexamethasone Kenalog 5 mg Xylocaine advised on soaks and hopefully he will reperfuse and is due to see his interventional cardiologist in the next week     Plan:  Above describes the plan

## 2020-05-23 ENCOUNTER — Ambulatory Visit (INDEPENDENT_AMBULATORY_CARE_PROVIDER_SITE_OTHER): Payer: Self-pay | Admitting: Cardiovascular Disease

## 2020-05-23 ENCOUNTER — Ambulatory Visit: Payer: BC Managed Care – PPO | Admitting: Cardiovascular Disease

## 2020-05-23 ENCOUNTER — Other Ambulatory Visit: Payer: Self-pay

## 2020-05-23 ENCOUNTER — Encounter: Payer: Self-pay | Admitting: Cardiovascular Disease

## 2020-05-23 DIAGNOSIS — E782 Mixed hyperlipidemia: Secondary | ICD-10-CM

## 2020-05-23 DIAGNOSIS — I10 Essential (primary) hypertension: Secondary | ICD-10-CM

## 2020-05-23 DIAGNOSIS — I739 Peripheral vascular disease, unspecified: Secondary | ICD-10-CM

## 2020-05-23 NOTE — Patient Instructions (Signed)
Medication Instructions:  Your physician recommends that you continue on your current medications as directed. Please refer to the Current Medication list given to you today.  *If you need a refill on your cardiac medications before your next appointment, please call your pharmacy*   Lab Work: Your physician recommends that you return for lab work in: 2 months lipid/liver profile  If you have labs (blood work) drawn today and your tests are completely normal, you will receive your results only by: Marland Kitchen MyChart Message (if you have MyChart) OR . A paper copy in the mail If you have any lab test that is abnormal or we need to change your treatment, we will call you to review the results.   Testing/Procedures: Your physician has requested that you have a lower extremity arterial duplex. This test is an ultrasound of the arteries in the legs. It looks at arterial blood flow in the legs. Allow one hour for Lower Arterial scans. There are no restrictions or special instructions.   Your physician has requested that you have an ankle brachial index (ABI). During this test an ultrasound and blood pressure cuff are used to evaluate the arteries that supply the arms and legs with blood. Allow thirty minutes for this exam. There are no restrictions or special instructions.  These procedures are done at [address] [facility] 2nd Floor  - to be done in 6-8 weeks.   Follow-Up: At [facility], you and your health needs are our priority.  As part of our continuing mission to provide you with exceptional heart care, we have created designated Provider Care Teams.  These Care Teams include your primary Cardiologist (physician) and Advanced Practice Providers (APPs -  Physician Assistants and Nurse Practitioners) who all work together to provide you with the care you need, when you need it.  We recommend signing up for the patient portal called "MyChart".  Sign up information is provided on this After Visit  Summary.  MyChart is used to connect with patients for Virtual Visits (Telemedicine).  Patients are able to view lab/test results, encounter notes, upcoming appointments, etc.  Non-urgent messages can be sent to your provider as well.   To learn more about what you can do with MyChart, go to [email or web address].    Your next appointment:   6 month(s)  The format for your next appointment:   In Person  Provider:   Nanetta Batty, MD

## 2020-05-23 NOTE — Assessment & Plan Note (Signed)
History of essential hypertension blood pressure yesterday 115/70.  He is on carvedilol and lisinopril.

## 2020-05-23 NOTE — Assessment & Plan Note (Signed)
History of hyperlipidemia recently started back on Crestor with a lipid profile performed 04/18/2020 revealing total cholesterol of 214,, LDL 141 and HDL 41.  He was not taking his rosuvastatin at that time.  We will recheck a lipid liver profile in 2 months.

## 2020-05-23 NOTE — Progress Notes (Signed)
05/23/2020 Lawrence Maynard   [birth year]  [id number]  Primary Physician Placey, Chales Abrahams, NP Primary Cardiologist: Runell Gess MD Nicholes Calamity, [place]  HPI:  Lawrence Maynard is a 55 y.o.  mild to moderately overweight separated African-American male father of 2 daughters, grandfather to 2 grandchildren referred by Dr. Dellia Nims for peripheral vascular valuation because of resting foot pain.  He works in [employer].  He recently relocated from [facility] to [place].    I last saw him in the office 04/29/2020.  He has had a stroke in the past and had a loop recorder implanted and explanted without demonstration of an arrhythmia.  He does have treated hypertension, diabetes and hyperlipidemia.  He denies chest pain.  He has had left foot pain and left calf claudication which is gotten worse over the last 6 months now to the point of rest pain.  Recent Dopplers performed today revealed noncompressible ABIs with tibial disease on the left.  I performed peripheral angiography, orbital atherectomy PTA and drug-eluting stenting of his occluded left below the knee popliteal artery on 05/05/2020.  This was done for critical limb ischemia resting left foot pain.  Unfortunately, 1 week later he had Doppler studies that revealed his stent to have occluded and he had his small surrounding hematoma although his claudication and foot pain have resolved.   Current Meds  Medication Sig  . allopurinol (ZYLOPRIM) 100 MG tablet Take 100 mg by mouth daily.  Marland Kitchen aspirin 81 MG chewable tablet Chew 81 mg by mouth daily.  . carvedilol (COREG) 6.25 MG tablet Take 6.25 mg by mouth 2 (two) times daily with a meal.  . colchicine 0.6 MG tablet Take 1 tablet (0.6 mg total) by mouth daily as needed (gout).  . Empagliflozin-metFORMIN HCl (SYNJARDY) 12.07-998 MG TABS Take 1 tablet by mouth 2 (two) times daily.  . insulin glargine, 2 Unit Dial, (TOUJEO MAX SOLOSTAR) 300 UNIT/ML Solostar Pen Inject 75 Units into the  skin daily.  Marland Kitchen lisinopril (ZESTRIL) 40 MG tablet Take 40 mg by mouth daily.  Marland Kitchen PLAVIX 75 MG tablet Take 75 mg by mouth daily.  . predniSONE (DELTASONE) 20 MG tablet Take 1 tablet (20 mg total) by mouth 2 (two) times daily.  . pregabalin (LYRICA) 50 MG capsule Take 1 capsule (50 mg total) by mouth 2 (two) times daily.  . rosuvastatin (CRESTOR) 20 MG tablet Take 20 mg by mouth at bedtime.  . sildenafil (VIAGRA) 50 MG tablet Take 50 mg by mouth daily as needed for erectile dysfunction.  . traMADol (ULTRAM) 50 MG tablet Take 1 tablet (50 mg total) by mouth 3 (three) times daily.   Current Facility-Administered Medications for the 05/23/20 encounter (Office Visit) with Runell Gess, MD  Medication  . triamcinolone acetonide (KENALOG) 10 MG/ML injection 10 mg     No Known Allergies  Social History   Socioeconomic History  . Marital status: Single    Spouse name: Not on file  . Number of children: Not on file  . Years of education: Not on file  . Highest education level: Not on file  Occupational History  . Not on file  Tobacco Use  . Smoking status: Never Smoker  . Smokeless tobacco: Never Used  Substance and Sexual Activity  . Alcohol use: Not Currently  . Drug use: Not on file  . Sexual activity: Not Currently  Other Topics Concern  . Not on file  Social History Narrative  . Not on  file   Social Determinants of Health   Financial Resource Strain: Not on file  Food Insecurity: Not on file  Transportation Needs: Not on file  Physical Activity: Not on file  Stress: Not on file  Social Connections: Not on file  Intimate Partner Violence: Not on file     Review of Systems: General: negative for chills, fever, night sweats or weight changes.  Cardiovascular: negative for chest pain, dyspnea on exertion, edema, orthopnea, palpitations, paroxysmal nocturnal dyspnea or shortness of breath Dermatological: negative for rash Respiratory: negative for cough or  wheezing Urologic: negative for hematuria Abdominal: negative for nausea, vomiting, diarrhea, bright red blood per rectum, melena, or hematemesis Neurologic: negative for visual changes, syncope, or dizziness All other systems reviewed and are otherwise negative except as noted above.    Blood pressure 115/70, pulse 74, height 5\' 7"  (1.702 m), weight 208 lb (94.3 kg), SpO2 100 %.  General appearance: alert and no distress Neck: no adenopathy, no carotid bruit, no JVD, supple, symmetrical, trachea midline and thyroid not enlarged, symmetric, no tenderness/mass/nodules Lungs: clear to auscultation bilaterally Heart: regular rate and rhythm, S1, S2 normal, no murmur, click, rub or gallop Extremities: extremities normal, atraumatic, no cyanosis or edema Pulses: 2+ and symmetric Skin: Skin color, texture, turgor normal. No rashes or lesions Neurologic: Grossly normal  EKG not performed today  ASSESSMENT AND PLAN:   Essential hypertension History of essential hypertension blood pressure yesterday 115/70.  He is on carvedilol and lisinopril.  Hyperlipidemia History of hyperlipidemia recently started back on Crestor with a lipid profile performed 04/18/2020 revealing total cholesterol of 214,, LDL 141 and HDL 41.  He was not taking his rosuvastatin at that time.  We will recheck a lipid liver profile in 2 months.  Peripheral arterial disease (HCC) History of PAD with left lower extremity critical ischemia and angiography performed 05/05/2020 revealing occluded below the knee popliteal artery on the left.  I was able to cross this and performed orbital atherectomy, PTA and drug-eluting stenting with reconstitution of his posterior tibial artery.  Unfortunately, his Doppler performed 1 week later revealed this to be occluded with some surrounding hematoma although since his procedure he no longer complains of foot pain or claudication.      07/03/2020 MD FACP,FACC,FAHA,  [facility] 05/23/2020 11:37 AM

## 2020-05-23 NOTE — Assessment & Plan Note (Signed)
History of PAD with left lower extremity critical ischemia and angiography performed 05/05/2020 revealing occluded below the knee popliteal artery on the left.  I was able to cross this and performed orbital atherectomy, PTA and drug-eluting stenting with reconstitution of his posterior tibial artery.  Unfortunately, his Doppler performed 1 week later revealed this to be occluded with some surrounding hematoma although since his procedure he no longer complains of foot pain or claudication.

## 2020-06-04 DIAGNOSIS — M109 Gout, unspecified: Secondary | ICD-10-CM | POA: Diagnosis not present

## 2020-06-04 DIAGNOSIS — E1165 Type 2 diabetes mellitus with hyperglycemia: Secondary | ICD-10-CM | POA: Diagnosis not present

## 2020-06-11 ENCOUNTER — Encounter (HOSPITAL_COMMUNITY): Payer: Self-pay

## 2020-06-21 ENCOUNTER — Encounter (HOSPITAL_COMMUNITY): Payer: Self-pay

## 2020-06-21 ENCOUNTER — Emergency Department (HOSPITAL_COMMUNITY)
Admission: EM | Admit: 2020-06-21 | Discharge: 2020-06-22 | Disposition: A | Discharge status: Home / Self Care | Payer: Self-pay | Attending: Emergency Medicine | Admitting: Emergency Medicine

## 2020-06-21 ENCOUNTER — Other Ambulatory Visit: Payer: Self-pay

## 2020-06-21 DIAGNOSIS — M10072 Idiopathic gout, left ankle and foot: Secondary | ICD-10-CM | POA: Insufficient documentation

## 2020-06-21 DIAGNOSIS — Z79899 Other long term (current) drug therapy: Secondary | ICD-10-CM | POA: Insufficient documentation

## 2020-06-21 DIAGNOSIS — M10071 Idiopathic gout, right ankle and foot: Secondary | ICD-10-CM | POA: Diagnosis not present

## 2020-06-21 DIAGNOSIS — Z794 Long term (current) use of insulin: Secondary | ICD-10-CM | POA: Insufficient documentation

## 2020-06-21 DIAGNOSIS — Z7984 Long term (current) use of oral hypoglycemic drugs: Secondary | ICD-10-CM | POA: Insufficient documentation

## 2020-06-21 DIAGNOSIS — E119 Type 2 diabetes mellitus without complications: Secondary | ICD-10-CM | POA: Insufficient documentation

## 2020-06-21 DIAGNOSIS — Z7982 Long term (current) use of aspirin: Secondary | ICD-10-CM | POA: Insufficient documentation

## 2020-06-21 DIAGNOSIS — Z7902 Long term (current) use of antithrombotics/antiplatelets: Secondary | ICD-10-CM | POA: Insufficient documentation

## 2020-06-21 DIAGNOSIS — M109 Gout, unspecified: Secondary | ICD-10-CM

## 2020-06-21 DIAGNOSIS — I10 Essential (primary) hypertension: Secondary | ICD-10-CM | POA: Insufficient documentation

## 2020-06-21 MED ORDER — KETOROLAC TROMETHAMINE 60 MG/2ML IM SOLN
30.0000 mg | Freq: Once | INTRAMUSCULAR | Status: AC
  Administered 2020-06-22: 30 mg via INTRAMUSCULAR
  Filled 2020-06-21: qty 2

## 2020-06-21 NOTE — ED Notes (Signed)
Pt angry pulled the call light as soon aS HE ARRIVED IN THE TREATMENT ROOM  HE STATED THAT HE HAD WAITED FOR 4 HOURS ALREADY AND HE DID NOT WANT TO WAIT FOR 4 MORE HOURS.  I TOLD HIM HE DID NOT HAVE TO WAIT  HE WANTED TO LEAve  I TOLD HIM OK THAT'S YOUR CHOICE  THEN HE STATED THAT HE NEEDED A CHAIR  To leave

## 2020-06-21 NOTE — ED Triage Notes (Addendum)
Pt reports that he went to a softball game, after he got home, left leg started hurting, and right side of neck hurts. Left pedal pulse is strong, cap refill < 3 sec. No known injury. History of gout and reports a week ago his uric acid level was elevated. Had stent put in left leg in February.

## 2020-06-22 LAB — CBG MONITORING, ED: Glucose-Capillary: 323 mg/dL — ABNORMAL HIGH (ref 70–99)

## 2020-06-22 NOTE — ED Notes (Signed)
Pt very upset about being in room and not seeing RN or receiving medicines

## 2020-06-22 NOTE — ED Provider Notes (Addendum)
[facility]  CSN: [id number] Arrival date & time: 06/21/20 1951  Chief Complaint(s) Leg Pain  HPI Loni BeckwithJames Primm is a 55 y.o. male with a history of gout currently having a gout flare of his left first MTP for which he is seen his PCP and outside hospital for.  Patient was out of town today for softball game and began having worsening pain of his left MTP which radiates up to his leg.  Pain is worse with ambulation.  Patient reports that he has prescribed colchicine and indomethacin but did not go home to get his medicine when he came back to town.  Denies any injuries.  Denies any other physical complaints.  HPI  Past Medical History Past Medical History:  Diagnosis Date  . Diabetes mellitus without complication (HCC)   . Gout   . Hypertension   . Obesity   . PAD (peripheral artery disease) [facility](HCC)    Patient Active Problem List   Diagnosis Date Noted  . Insulin dependent type 2 diabetes mellitus (HCC) 05/06/2020  . Gout 05/06/2020  . Critical lower limb ischemia (HCC) 05/05/2020  . Essential hypertension 04/29/2020  . Hyperlipidemia 04/29/2020  . Peripheral arterial disease (HCC) 04/29/2020   Home Medication(s) Prior to Admission medications   Medication Sig Start Date End Date Taking? Authorizing Provider  allopurinol (ZYLOPRIM) 100 MG tablet Take 100 mg by mouth daily. 01/16/20   [provider]  aspirin 81 MG chewable tablet Chew 81 mg by mouth daily. 10/18/17   [provider]  carvedilol (COREG) 6.25 MG tablet Take 6.25 mg by mouth 2 (two) times daily with a meal.    [provider]  colchicine 0.6 MG tablet Take 1 tablet (0.6 mg total) by mouth daily as needed (gout). 05/06/20 05/06/21  Corky CraftsVaranasi, Jayadeep S, MD  Empagliflozin-metFORMIN HCl [facility](SYNJARDY) 12.07-998 MG TABS Take 1 tablet by mouth 2 (two) times daily. 02/06/20   [provider]  insulin glargine, 2 Unit Dial, (TOUJEO MAX SOLOSTAR)  300 UNIT/ML Solostar Pen Inject 75 Units into the skin daily. 11/21/19   [provider]  lisinopril (ZESTRIL) 40 MG tablet Take 40 mg by mouth daily. 01/25/08   [provider]  PLAVIX 75 MG tablet Take 75 mg by mouth daily. 02/18/20   [provider]  predniSONE (DELTASONE) 20 MG tablet Take 1 tablet (20 mg total) by mouth 2 (two) times daily. 05/06/20   Corky CraftsVaranasi, Jayadeep S, MD  pregabalin (LYRICA) 50 MG capsule Take 1 capsule (50 mg total) by mouth 2 (two) times daily. 04/23/20   Wallis BambergMani, Mario, PA-C  rosuvastatin (CRESTOR) 20 MG tablet Take 20 mg by mouth at bedtime. 04/18/20   [provider]  sildenafil (VIAGRA) 50 MG tablet Take 50 mg by mouth daily as needed for erectile dysfunction. 04/18/20   [provider]  traMADol (ULTRAM) 50 MG tablet Take 1 tablet (50 mg total) by mouth 3 (three) times daily. 04/25/20   Lenn Sinkegal, Norman S, DPM  Past Surgical History Past Surgical History:  Procedure Laterality Date  . ABDOMINAL AORTOGRAM W/LOWER EXTREMITY Left 05/05/2020   Procedure: ABDOMINAL AORTOGRAM W/LOWER EXTREMITY;  Surgeon: Runell Gess, MD;  Location: [facility] INVASIVE CV LAB;  Service: Cardiovascular;  Laterality: Left;  . PERIPHERAL VASCULAR ATHERECTOMY Left 05/05/2020   Procedure: PERIPHERAL VASCULAR ATHERECTOMY;  Surgeon: Runell Gess, MD;  Location: [facility] INVASIVE CV LAB;  Service: Cardiovascular;  Laterality: Left;  pt trunk  . PERIPHERAL VASCULAR INTERVENTION Left 05/05/2020   Procedure: PERIPHERAL VASCULAR INTERVENTION;  Surgeon: Runell Gess, MD;  Location: [facility] INVASIVE CV LAB;  Service: Cardiovascular;  Laterality: Left;  pt trunk   Family History Family History  Problem Relation Age of Onset  . Diabetes Mother   . Hypertension Mother   . Gout Mother   . Diabetes Father   . Hypertension Father   . Gout Father      Social History Social History   Tobacco Use  . Smoking status: Never Smoker  . Smokeless tobacco: Never Used  Substance Use Topics  . Alcohol use: Not Currently   Allergies Patient has no known allergies.  Review of Systems Review of Systems All other systems are reviewed and are negative for acute change except as noted in the HPI  Physical Exam Vital Signs  I have reviewed the triage vital signs BP 124/83 (BP Location: Right Arm)   Pulse (!) 103   Temp 98.7 F (37.1 C) (Oral)   Resp 18   Ht 5\' 7"  (1.702 m)   Wt 95.3 kg   SpO2 98%   BMI 32.89 kg/m   Physical Exam Vitals reviewed.  Constitutional:      General: He is not in acute distress.    Appearance: He is well-developed. He is not diaphoretic.  HENT:     Head: Normocephalic and atraumatic.     Jaw: No trismus.     Right Ear: External ear normal.     Left Ear: External ear normal.     Nose: Nose normal.  Eyes:     General: No scleral icterus.    Conjunctiva/sclera: Conjunctivae normal.  Neck:     Trachea: Phonation normal.  Cardiovascular:     Rate and Rhythm: Normal rate and regular rhythm.  Pulmonary:     Effort: Pulmonary effort is normal. No respiratory distress.     Breath sounds: No stridor.  Abdominal:     General: There is no distension.  Musculoskeletal:        General: Normal range of motion.     Cervical back: Normal range of motion.     Left lower leg: No swelling, tenderness or bony tenderness. No edema.     Left ankle: No swelling. No tenderness. Normal range of motion.     Left foot: Tenderness and bony tenderness present.       Feet:  Neurological:     Mental Status: He is alert and oriented to person, place, and time.  Psychiatric:        Behavior: Behavior normal.     ED Results and Treatments Labs (all labs ordered are listed, but only abnormal results are displayed) Labs Reviewed  CBG MONITORING, ED  EKG  EKG Interpretation  Date/Time:    Ventricular Rate:    PR Interval:    QRS Duration:   QT Interval:    QTC Calculation:   R Axis:     Text Interpretation:        Radiology No results found.  Pertinent labs & imaging results that were available during my care of the patient were reviewed by me and considered in my medical decision making (see chart for details).  Medications Ordered in ED Medications  ketorolac (TORADOL) injection 30 mg (has no administration in time range)                                                                                                                                    Procedures Procedures  (including critical care time)  Medical Decision Making / ED Course I have reviewed the nursing notes for this encounter and the patient's prior records (if available in EHR or on provided paperwork).   Jai Steil was evaluated in Emergency Department on 06/22/2020 for the symptoms described in the history of present illness. He was evaluated in the context of the global COVID-19 pandemic, which necessitated consideration that the patient might be at risk for infection with the SARS-CoV-2 virus that causes COVID-19. Institutional protocols and algorithms that pertain to the evaluation of patients at risk for COVID-19 are in a state of rapid change based on information released by regulatory bodies including the CDC and federal and state organizations. These policies and algorithms were followed during the patient's care in the ED.  Patient has a history of diabetes and has had numerous steroid injections recently.  Will obtain a CBG. Provided with Toradol IM. The patient CBG is reasonable, will prescribe short course of steroids.  CBG in the 300s. Not appropriate for a steroid taper. Recommend he follow up with PCP.       Final Clinical Impression(s) / ED Diagnoses Final diagnoses:  Gout of  big toe   The patient appears reasonably screened and/or stabilized for discharge and I doubt any other medical condition or other [facility] requiring further screening, evaluation, or treatment in the ED at this time prior to discharge. Safe for discharge with strict return precautions.  Disposition: Discharge  Condition: Good  I have discussed the results, Dx and Tx plan with the patient/family who expressed understanding and agree(s) with the plan. Discharge instructions discussed at length. The patient/family was given strict return precautions who verbalized understanding of the instructions. No further questions at time of discharge.    ED Discharge Orders    None       Follow Up: Primary care provider  Call  to schedule an appointment for close follow up     This chart was dictated using voice recognition software.  Despite best efforts to proofread,  errors can occur which can change the documentation meaning.     Venie Montesinos, Amadeo Garnet,  MD 06/22/20 [id number]

## 2020-06-22 NOTE — Discharge Instructions (Addendum)
Your sugar was still elevated at this time and we are not able to provide you a prescription for a steroid taper as this could cause her sugars to continue to rise.  Please follow-up with your primary care provider for any additional pain management for your gout flare.

## 2020-06-27 ENCOUNTER — Other Ambulatory Visit: Payer: Self-pay

## 2020-06-27 ENCOUNTER — Other Ambulatory Visit: Payer: Self-pay | Admitting: Cardiovascular Disease

## 2020-06-27 ENCOUNTER — Ambulatory Visit (HOSPITAL_COMMUNITY)
Admission: RE | Admit: 2020-06-27 | Discharge: 2020-06-27 | Disposition: A | Discharge status: Home / Self Care | Payer: Self-pay | Source: Ambulatory Visit | Attending: Internal Medicine | Admitting: Internal Medicine

## 2020-06-27 ENCOUNTER — Other Ambulatory Visit: Payer: Self-pay | Admitting: Physician Assistant

## 2020-06-27 ENCOUNTER — Encounter: Payer: Self-pay | Admitting: Cardiovascular Disease

## 2020-06-27 ENCOUNTER — Other Ambulatory Visit (HOSPITAL_COMMUNITY): Payer: Self-pay | Admitting: Cardiovascular Disease

## 2020-06-27 DIAGNOSIS — I739 Peripheral vascular disease, unspecified: Secondary | ICD-10-CM | POA: Insufficient documentation

## 2020-06-27 DIAGNOSIS — I10 Essential (primary) hypertension: Secondary | ICD-10-CM | POA: Insufficient documentation

## 2020-06-27 DIAGNOSIS — E782 Mixed hyperlipidemia: Secondary | ICD-10-CM | POA: Insufficient documentation

## 2020-06-27 MED ORDER — PLAVIX 75 MG PO TABS: 75.0000 mg | ORAL_TABLET | Freq: Every day | ORAL | 3 refills | Status: DC

## 2020-06-27 MED ORDER — CLOPIDOGREL BISULFATE 75 MG PO TABS: 75.0000 mg | ORAL_TABLET | Freq: Every day | ORAL | 3 refills | Status: DC

## 2020-06-30 ENCOUNTER — Telehealth: Payer: Self-pay | Admitting: Cardiovascular Disease

## 2020-06-30 DIAGNOSIS — I70229 Atherosclerosis of native arteries of extremities with rest pain, unspecified extremity: Secondary | ICD-10-CM

## 2020-06-30 DIAGNOSIS — I739 Peripheral vascular disease, unspecified: Secondary | ICD-10-CM

## 2020-06-30 NOTE — Telephone Encounter (Signed)
Spoke to patient he stated he has been having pain in left leg were he had a stent.Stated he notices pain when walking at work.He wanted to know results of dopplers he had done 4/1 and what can he take for pain.Advised doppler results not available.Advised he can take Tylenol for pain.Advised Dr.Berry not in office today.I will send message to him for advice.

## 2020-06-30 NOTE — Telephone Encounter (Signed)
Doppler showed an occluded stent with moderate sized hematoma which hopefully will shrink over time. Re check a doppler in 3 months to size the hematoma

## 2020-06-30 NOTE — Telephone Encounter (Signed)
Follow Up:     Pt is calling to see if Ultrasound results are back from Friday?

## 2020-07-01 NOTE — Telephone Encounter (Signed)
Spoke to patient Dr.Berry's advice given.Advised to repeat lower ext arterial dopplers in 3 months.Scheduler will call back with appointment.Patient wanted stronger pain med.Advised to take extra strength tylenol.Advised to call back if pain becomes worse.

## 2020-07-03 ENCOUNTER — Inpatient Hospital Stay (HOSPITAL_COMMUNITY): Admission: RE | Admit: 2020-07-03 | Payer: Self-pay | Source: Ambulatory Visit

## 2020-07-04 DIAGNOSIS — Z794 Long term (current) use of insulin: Secondary | ICD-10-CM | POA: Insufficient documentation

## 2020-07-04 DIAGNOSIS — M10072 Idiopathic gout, left ankle and foot: Secondary | ICD-10-CM | POA: Diagnosis not present

## 2020-07-04 DIAGNOSIS — Z7984 Long term (current) use of oral hypoglycemic drugs: Secondary | ICD-10-CM | POA: Insufficient documentation

## 2020-07-04 DIAGNOSIS — Z79899 Other long term (current) drug therapy: Secondary | ICD-10-CM | POA: Insufficient documentation

## 2020-07-04 DIAGNOSIS — Z7982 Long term (current) use of aspirin: Secondary | ICD-10-CM | POA: Insufficient documentation

## 2020-07-04 DIAGNOSIS — E119 Type 2 diabetes mellitus without complications: Secondary | ICD-10-CM | POA: Insufficient documentation

## 2020-07-04 DIAGNOSIS — H5712 Ocular pain, left eye: Secondary | ICD-10-CM | POA: Diagnosis not present

## 2020-07-04 DIAGNOSIS — I10 Essential (primary) hypertension: Secondary | ICD-10-CM | POA: Insufficient documentation

## 2020-07-05 ENCOUNTER — Emergency Department (HOSPITAL_COMMUNITY)
Admission: EM | Admit: 2020-07-05 | Discharge: 2020-07-05 | Disposition: A | Discharge status: Home / Self Care | Payer: Self-pay | Attending: Emergency Medicine | Admitting: Emergency Medicine

## 2020-07-05 ENCOUNTER — Encounter (HOSPITAL_COMMUNITY): Payer: Self-pay

## 2020-07-05 ENCOUNTER — Other Ambulatory Visit: Payer: Self-pay

## 2020-07-05 DIAGNOSIS — H5712 Ocular pain, left eye: Secondary | ICD-10-CM

## 2020-07-05 DIAGNOSIS — M109 Gout, unspecified: Secondary | ICD-10-CM

## 2020-07-05 MED ORDER — OXYCODONE HCL 5 MG PO TABS: 5.0000 mg | ORAL_TABLET | ORAL | 0 refills | Status: DC | PRN

## 2020-07-05 MED ORDER — PREDNISONE 20 MG PO TABS: 40.0000 mg | ORAL_TABLET | Freq: Every day | ORAL | 0 refills | Status: DC

## 2020-07-05 MED ORDER — PREDNISONE 20 MG PO TABS
60.0000 mg | ORAL_TABLET | Freq: Once | ORAL | Status: AC
  Administered 2020-07-05: 60 mg via ORAL
  Filled 2020-07-05: qty 3

## 2020-07-05 MED ORDER — FLUORESCEIN SODIUM 1 MG OP STRP
1.0000 | ORAL_STRIP | Freq: Once | OPHTHALMIC | Status: AC
  Administered 2020-07-05: 1 via OPHTHALMIC
  Filled 2020-07-05: qty 1

## 2020-07-05 MED ORDER — OXYCODONE-ACETAMINOPHEN 5-325 MG PO TABS
1.0000 | ORAL_TABLET | Freq: Once | ORAL | Status: AC
  Administered 2020-07-05: 1 via ORAL
  Filled 2020-07-05: qty 1

## 2020-07-05 MED ORDER — TETRACAINE HCL 0.5 % OP SOLN
2.0000 [drp] | Freq: Once | OPHTHALMIC | Status: AC
  Administered 2020-07-05: 2 [drp] via OPHTHALMIC
  Filled 2020-07-05: qty 4

## 2020-07-05 NOTE — ED Provider Notes (Signed)
[facility] EMERGENCY DEPARTMENT Provider Note   CSN: [id number] Arrival date & time: 07/04/20  2345   History Chief Complaint  Patient presents with  . Eye Problem    Lawrence Maynard is a 55 y.o. male.  The history is provided by the patient.  Eye Problem He has history of hypertension, diabetes, gout, peripheral vascular disease and comes in complaining that he got something in his left eye.  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  He is complaining of pain under the upper lid.  He is also is complaining of pain in his left great toe which he thinks is an exacerbation of his gout.  He is up-to-date on tetanus immunizations.  Past Medical History:  Diagnosis Date  . Diabetes mellitus without complication (HCC)   . Gout   . Hypertension   . Obesity   . PAD (peripheral artery disease) [facility])     Patient Active Problem List   Diagnosis Date Noted  . Insulin dependent type 2 diabetes mellitus (HCC) 05/06/2020  . Gout 05/06/2020  . Critical lower limb ischemia (HCC) 05/05/2020  . Essential hypertension 04/29/2020  . Hyperlipidemia 04/29/2020  . Peripheral arterial disease (HCC) 04/29/2020    Past Surgical History:  Procedure Laterality Date  . ABDOMINAL AORTOGRAM W/LOWER EXTREMITY Left 05/05/2020   Procedure: ABDOMINAL AORTOGRAM W/LOWER EXTREMITY;  Surgeon: Runell Gess, MD;  Location: [facility] INVASIVE CV LAB;  Service: Cardiovascular;  Laterality: Left;  . PERIPHERAL VASCULAR ATHERECTOMY Left 05/05/2020   Procedure: PERIPHERAL VASCULAR ATHERECTOMY;  Surgeon: Runell Gess, MD;  Location: [facility] INVASIVE CV LAB;  Service: Cardiovascular;  Laterality: Left;  pt trunk  . PERIPHERAL VASCULAR INTERVENTION Left 05/05/2020   Procedure: PERIPHERAL VASCULAR INTERVENTION;  Surgeon: Runell Gess, MD;  Location: [facility] INVASIVE CV LAB;  Service: Cardiovascular;  Laterality: Left;  pt trunk       Family History  Problem Relation Age  of Onset  . Diabetes Mother   . Hypertension Mother   . Gout Mother   . Diabetes Father   . Hypertension Father   . Gout Father     Social History   Tobacco Use  . Smoking status: Never Smoker  . Smokeless tobacco: Never Used  Substance Use Topics  . Alcohol use: Not Currently    Home Medications Prior to Admission medications   Medication Sig Start Date End Date Taking? Authorizing Provider  oxyCODONE (ROXICODONE) 5 MG immediate release tablet Take 1 tablet (5 mg total) by mouth every 4 (four) hours as needed for severe pain. 07/05/20  Yes Dione Booze, MD  allopurinol (ZYLOPRIM) 100 MG tablet Take 100 mg by mouth daily. 01/16/20   [provider]  aspirin 81 MG chewable tablet Chew 81 mg by mouth daily. 10/18/17   [provider]  carvedilol (COREG) 6.25 MG tablet Take 6.25 mg by mouth 2 (two) times daily with a meal.    [provider]  clopidogrel (PLAVIX) 75 MG tablet Take 1 tablet (75 mg total) by mouth daily. 06/27/20   Duke, Roe Rutherford, PA  colchicine 0.6 MG tablet TAKE 1 TABLET (0.6 MG TOTAL) BY MOUTH DAILY AS NEEDED (GOUT). 05/06/20 05/06/21  Dunn, Tacey Ruiz, PA-C  Empagliflozin-metFORMIN HCl (SYNJARDY) 12.07-998 MG TABS Take 1 tablet by mouth 2 (two) times daily. 02/06/20   [provider]  insulin glargine, 2 Unit Dial, (TOUJEO MAX SOLOSTAR) 300 UNIT/ML Solostar Pen Inject 75 Units into the skin  daily. 11/21/19   [provider]  lisinopril (ZESTRIL) 40 MG tablet Take 40 mg by mouth daily. 01/25/08   [provider]  predniSONE (DELTASONE) 20 MG tablet Take 2 tablets (40 mg total) by mouth daily. 07/05/20   Dione Booze, MD  pregabalin (LYRICA) 50 MG capsule Take 1 capsule (50 mg total) by mouth 2 (two) times daily. 04/23/20   Wallis Bamberg, PA-C  rosuvastatin (CRESTOR) 20 MG tablet Take 20 mg by mouth at bedtime. 04/18/20   [provider]  sildenafil (VIAGRA) 50 MG tablet Take 50 mg by mouth daily as needed for erectile  dysfunction. 04/18/20   [provider]  traMADol (ULTRAM) 50 MG tablet Take 1 tablet (50 mg total) by mouth 3 (three) times daily. 04/25/20   Lenn Sink, DPM    Allergies    Patient has no known allergies.  Review of Systems   Review of Systems  All other systems reviewed and are negative.   Physical Exam Updated Vital Signs BP 120/70   Pulse 88   Temp (!) 97.4 F (36.3 C) (Oral)   Resp 17   Ht 5\' 7"  (1.702 m)   Wt 99.3 kg   SpO2 97%   BMI 34.30 kg/m   Physical Exam Vitals and nursing note reviewed.   55 year old male, resting comfortably and in no acute distress. Vital signs are normal. Oxygen saturation is 97%, which is normal. Head is normocephalic and atraumatic. PERRLA, EOMI. Oropharynx is clear.  No foreign body seen on the left cornea.  Anterior chamber is clear.  Upper and lower conjunctival sacs were inspected after eversion of lids and no foreign body was seen.  Slit-lamp exam shows no foreign body.  I stained with fluorescein and examined with a cobalt blue filter and there is a suggestion of an area of increased uptake in the inferior nasal aspect of the cornea, but no discrete abrasion seen. Neck is nontender and supple without adenopathy or JVD. Back is nontender and there is no CVA tenderness. Lungs are clear without rales, wheezes, or rhonchi. Chest is nontender. Heart has regular rate and rhythm without murmur. Abdomen is soft, flat, nontender without masses or hepatosplenomegaly and peristalsis is normoactive. Extremities have no cyanosis or edema, full range of motion is present.  Mild swelling and warmth of the left first MTP joint consistent with gout Skin is warm and dry without rash. Neurologic: Mental status is normal, cranial nerves are intact, there are no motor or sensory deficits.  ED Results / Procedures / Treatments    Procedures Procedures   Medications Ordered in ED Medications  predniSONE (DELTASONE) tablet 60 mg (has no  administration in time range)  oxyCODONE-acetaminophen (PERCOCET/ROXICET) 5-325 MG per tablet 1 tablet (has no administration in time range)  tetracaine (PONTOCAINE) 0.5 % ophthalmic solution 2 drop (2 drops Left Eye Given by Other 07/05/20 0340)  fluorescein ophthalmic strip 1 strip (1 strip Left Eye Given by Other 07/05/20 0340)    ED Course  I have reviewed the triage vital signs and the nursing notes.  MDM Rules/Calculators/A&P Foreign body sensation of the left eye suspicious for corneal abrasion but no definite abrasion seen on exam.  He will be referred to ophthalmology for further evaluation.  Visual acuity was obtained and is 20/32 in each eye.  He is given a dose of prednisone and oxycodone-acetaminophen, discharged with prescriptions for prednisone and oxycodone.  He is to follow-up with his primary care provider in 5 days,  advised to monitor his blood sugar while taking prednisone.  Old records are reviewed, and he does have prior ED visits for gout.  Final Clinical Impression(s) / ED Diagnoses Final diagnoses:  Pain in or around eye, left  Gouty arthritis of left great toe    Rx / DC Orders ED Discharge Orders         Ordered    predniSONE (DELTASONE) 20 MG tablet  Daily        07/05/20 0340    oxyCODONE (ROXICODONE) 5 MG immediate release tablet  Every 4 hours PRN        07/05/20 0340           Dione Booze, MD 07/05/20 [phone]

## 2020-07-05 NOTE — Discharge Instructions (Signed)
You may take ibuprofen or acetaminophen as needed for pain, take oxycodone for severe pain.  Your exam today did not definitely show a scratch on your cornea, but I suspect that that is what is causing your eye pain.  Please see the ophthalmologist today for a more complete eye exam - call first thing in the morning to ask for a same-day appointment.  You will be taking prednisone for your gout.  This can make your blood sugar go higher.  Please monitor it closely.

## 2020-07-05 NOTE — ED Triage Notes (Signed)
Pt reports that he has something in his eye and it will not come out. 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 No redness or drainage noted at this time.

## 2020-07-11 ENCOUNTER — Emergency Department (HOSPITAL_COMMUNITY)
Admission: EM | Admit: 2020-07-11 | Discharge: 2020-07-11 | Disposition: A | Discharge status: Home / Self Care | Payer: Self-pay | Attending: Emergency Medicine | Admitting: Emergency Medicine

## 2020-07-11 ENCOUNTER — Encounter (HOSPITAL_COMMUNITY): Payer: Self-pay

## 2020-07-11 DIAGNOSIS — M109 Gout, unspecified: Secondary | ICD-10-CM | POA: Insufficient documentation

## 2020-07-11 DIAGNOSIS — Z7982 Long term (current) use of aspirin: Secondary | ICD-10-CM | POA: Insufficient documentation

## 2020-07-11 DIAGNOSIS — L03011 Cellulitis of right finger: Secondary | ICD-10-CM | POA: Diagnosis not present

## 2020-07-11 DIAGNOSIS — Z794 Long term (current) use of insulin: Secondary | ICD-10-CM | POA: Insufficient documentation

## 2020-07-11 DIAGNOSIS — I10 Essential (primary) hypertension: Secondary | ICD-10-CM | POA: Insufficient documentation

## 2020-07-11 DIAGNOSIS — E119 Type 2 diabetes mellitus without complications: Secondary | ICD-10-CM | POA: Insufficient documentation

## 2020-07-11 DIAGNOSIS — M10072 Idiopathic gout, left ankle and foot: Secondary | ICD-10-CM | POA: Diagnosis not present

## 2020-07-11 DIAGNOSIS — Z79899 Other long term (current) drug therapy: Secondary | ICD-10-CM | POA: Insufficient documentation

## 2020-07-11 DIAGNOSIS — Z7902 Long term (current) use of antithrombotics/antiplatelets: Secondary | ICD-10-CM | POA: Insufficient documentation

## 2020-07-11 MED ORDER — PREDNISONE 20 MG PO TABS: ORAL_TABLET | ORAL | 0 refills | Status: DC

## 2020-07-11 MED ORDER — DOXYCYCLINE HYCLATE 100 MG PO CAPS: 100.0000 mg | ORAL_CAPSULE | Freq: Two times a day (BID) | ORAL | 0 refills | Status: DC

## 2020-07-11 MED ORDER — COLCHICINE 0.6 MG PO TABS: 0.6000 mg | ORAL_TABLET | Freq: Every day | ORAL | 0 refills | Status: DC

## 2020-07-11 MED ORDER — KETOROLAC TROMETHAMINE 60 MG/2ML IM SOLN
60.0000 mg | Freq: Once | INTRAMUSCULAR | Status: AC
  Administered 2020-07-11: 60 mg via INTRAMUSCULAR
  Filled 2020-07-11: qty 2

## 2020-07-11 NOTE — ED Notes (Signed)
Pt ambulating to restroom at this time.

## 2020-07-11 NOTE — ED Provider Notes (Signed)
[facility]-EMERGENCY DEPT Provider Note   CSN: [id number] Arrival date & time: 07/11/20  8850     History Chief Complaint  Patient presents with  . Leg Pain  . Hand Pain    Lawrence Maynard is a 55 y.o. male.  The history is provided by the patient and medical records. No language interpreter was used.  Leg Pain Hand Pain     55 year old male significant history of gout, diabetes, hypertension, obesity, PAD, presenting for evaluation of joint pain.  Patient report he still pain about his left ankle since yesterday after he ate to hot dog.  Pain is sharp, nonradiating, moderate intensity, and felt similar to prior gout flare.  Pain worse when he moves his ankle or with palpation.  Furthermore, he also complaining of pain to his right thumb for the past 2 to 3 days.  States he bit his cuticle several days prior and since then it has been sore.  He has noticed increased swelling and tenderness to the touch without any joint pain to his finger.  No fever no numbness.  No recent injury.  Past Medical History:  Diagnosis Date  . Diabetes mellitus without complication (HCC)   . Gout   . Hypertension   . Obesity   . PAD (peripheral artery disease) [facility])     Patient Active Problem List   Diagnosis Date Noted  . Insulin dependent type 2 diabetes mellitus (HCC) 05/06/2020  . Gout 05/06/2020  . Critical lower limb ischemia (HCC) 05/05/2020  . Essential hypertension 04/29/2020  . Hyperlipidemia 04/29/2020  . Peripheral arterial disease (HCC) 04/29/2020    Past Surgical History:  Procedure Laterality Date  . ABDOMINAL AORTOGRAM W/LOWER EXTREMITY Left 05/05/2020   Procedure: ABDOMINAL AORTOGRAM W/LOWER EXTREMITY;  Surgeon: Runell Gess, MD;  Location: [facility] INVASIVE CV LAB;  Service: Cardiovascular;  Laterality: Left;  . PERIPHERAL VASCULAR ATHERECTOMY Left 05/05/2020   Procedure: PERIPHERAL VASCULAR ATHERECTOMY;  Surgeon: Runell Gess, MD;  Location: [facility] INVASIVE  CV LAB;  Service: Cardiovascular;  Laterality: Left;  pt trunk  . PERIPHERAL VASCULAR INTERVENTION Left 05/05/2020   Procedure: PERIPHERAL VASCULAR INTERVENTION;  Surgeon: Runell Gess, MD;  Location: [facility] INVASIVE CV LAB;  Service: Cardiovascular;  Laterality: Left;  pt trunk       Family History  Problem Relation Age of Onset  . Diabetes Mother   . Hypertension Mother   . Gout Mother   . Diabetes Father   . Hypertension Father   . Gout Father     Social History   Tobacco Use  . Smoking status: Never Smoker  . Smokeless tobacco: Never Used  Substance Use Topics  . Alcohol use: Not Currently    Home Medications Prior to Admission medications   Medication Sig Start Date End Date Taking? Authorizing Provider  allopurinol (ZYLOPRIM) 100 MG tablet Take 100 mg by mouth daily. 01/16/20   [provider]  aspirin 81 MG chewable tablet Chew 81 mg by mouth daily. 10/18/17   [provider]  carvedilol (COREG) 6.25 MG tablet Take 6.25 mg by mouth 2 (two) times daily with a meal.    [provider]  clopidogrel (PLAVIX) 75 MG tablet Take 1 tablet (75 mg total) by mouth daily. 06/27/20   Duke, Roe Rutherford, PA  colchicine 0.6 MG tablet TAKE 1 TABLET (0.6 MG TOTAL) BY MOUTH DAILY AS NEEDED (GOUT). 05/06/20 05/06/21  Dunn, Tacey Ruiz, PA-C  Empagliflozin-metFORMIN HCl (SYNJARDY) 12.07-998 MG TABS Take 1 tablet  by mouth 2 (two) times daily. 02/06/20   [provider]  insulin glargine, 2 Unit Dial, (TOUJEO MAX SOLOSTAR) 300 UNIT/ML Solostar Pen Inject 75 Units into the skin daily. 11/21/19   [provider]  lisinopril (ZESTRIL) 40 MG tablet Take 40 mg by mouth daily. 01/25/08   [provider]  oxyCODONE (ROXICODONE) 5 MG immediate release tablet Take 1 tablet (5 mg total) by mouth every 4 (four) hours as needed for severe pain. 07/05/20   Dione Booze, MD  predniSONE (DELTASONE) 20 MG tablet Take 2 tablets (40 mg total) by mouth daily. 07/05/20    Dione Booze, MD  pregabalin (LYRICA) 50 MG capsule Take 1 capsule (50 mg total) by mouth 2 (two) times daily. 04/23/20   Wallis Bamberg, PA-C  rosuvastatin (CRESTOR) 20 MG tablet Take 20 mg by mouth at bedtime. 04/18/20   [provider]  sildenafil (VIAGRA) 50 MG tablet Take 50 mg by mouth daily as needed for erectile dysfunction. 04/18/20   [provider]  traMADol (ULTRAM) 50 MG tablet Take 1 tablet (50 mg total) by mouth 3 (three) times daily. 04/25/20   Lenn Sink, DPM    Allergies    Patient has no known allergies.  Review of Systems   Review of Systems  All other systems reviewed and are negative.   Physical Exam Updated Vital Signs BP (!) 120/96 (BP Location: Right Arm)   Pulse 91   Temp 98.2 F (36.8 C) (Oral)   Resp 16   SpO2 100%   Physical Exam Vitals and nursing note reviewed.  Constitutional:      General: He is not in acute distress.    Appearance: He is well-developed.  HENT:     Head: Atraumatic.  Eyes:     Conjunctiva/sclera: Conjunctivae normal.  Musculoskeletal:        General: Tenderness (Left ankle: Tenderness about the ankle on palpation with minimal swelling but no significant erythema or warmth appreciated.  Ankle with full range of motion but with reproducible pain.  DP pulse palpable.) present.     Cervical back: Neck supple.  Skin:    Capillary Refill: Capillary refill takes less than 2 seconds.     Findings: No rash.     Comments: Right thumb: Tenderness along the lateral cuticle nail fold with swelling suggestive of a paronychia.  No joint involvement.  Neurological:     Mental Status: He is alert. Mental status is at baseline.  Psychiatric:        Mood and Affect: Mood normal.     ED Results / Procedures / Treatments   Labs (all labs ordered are listed, but only abnormal results are displayed) Labs Reviewed - No data to display  EKG None  Radiology No results found.  Procedures Procedures   Medications  Ordered in ED Medications  ketorolac (TORADOL) injection 60 mg (60 mg Intramuscular Given 07/11/20 0754)    ED Course  I have reviewed the triage vital signs and the nursing notes.  Pertinent labs & imaging results that were available during my care of the patient were reviewed by me and considered in my medical decision making (see chart for details).    MDM Rules/Calculators/A&P                          BP (!) 120/96   Pulse 91   Temp 98.2 F (36.8 C)   Resp 16   SpO2 100%  Final Clinical Impression(s) / ED Diagnoses Final diagnoses:  Acute paronychia of right thumb  Acute gout of left ankle, unspecified cause    Rx / DC Orders ED Discharge Orders         Ordered    colchicine 0.6 MG tablet  Daily        07/11/20 0822    predniSONE (DELTASONE) 20 MG tablet        07/11/20 0822    doxycycline (VIBRAMYCIN) 100 MG capsule  2 times daily        07/11/20 0822         7:48 AM Patient developed left ankle pain after eating hot dogs.  Pain is consistent with gouty flare that he has had in the past.  Patient states usually steroid does help for him and he does have insulin at home to use when his blood sugar elevates.  In that setting, I will prescribe a short course of burst and taper steroid along with colchicine.  Patient complaining of right thumb pain.  Finding consistent with a right paronychia.  I offer to perform incision and drainage however this is an early manifestation of the paronychia and patient would prefer to continue soaking his thumb in Epsom salt water and declined I&D at this time.  Patient understands that if it progresses, he would likely benefit from an I&D.  Will prescribe antibiotic.  Return precaution given.  I have low suspicion for septic joint or any systemic manifestation.   Fayrene Helper, PA-C 07/11/20 0300    Vanetta Mulders, MD 07/11/20 [phone]

## 2020-07-11 NOTE — ED Triage Notes (Signed)
Pt arrived via walk in, c/o left leg pain and pain to thumb of right hand. States he believes it is his gout flaring up, hx of. Increased swelling since last night. States he ate x2 hot dogs yesterday and that is when pain started.

## 2020-07-11 NOTE — Discharge Instructions (Addendum)
Continue soaking your right thumb in Epsom salt water several times daily to help aid with healing.  If you notice worsening pain and swelling please return for incision and drainage procedure.  Monitor your blood sugar carefully while you are on steroids for treatment of your gout.

## 2020-07-12 ENCOUNTER — Emergency Department (HOSPITAL_COMMUNITY): Admission: EM | Admit: 2020-07-12 | Discharge: 2020-07-12 | Discharge status: Against Medical Advice | Payer: Self-pay

## 2020-07-12 NOTE — ED Notes (Signed)
Patient stated he wanted to leave. MSE waiver signed by patient.

## 2020-07-13 ENCOUNTER — Emergency Department (HOSPITAL_COMMUNITY)
Admission: EM | Admit: 2020-07-13 | Discharge: 2020-07-13 | Disposition: A | Discharge status: Home / Self Care | Payer: Self-pay | Attending: Emergency Medicine | Admitting: Emergency Medicine

## 2020-07-13 ENCOUNTER — Emergency Department (HOSPITAL_COMMUNITY): Payer: Self-pay

## 2020-07-13 ENCOUNTER — Other Ambulatory Visit: Payer: Self-pay

## 2020-07-13 ENCOUNTER — Encounter (HOSPITAL_COMMUNITY): Payer: Self-pay | Admitting: Emergency Medicine

## 2020-07-13 DIAGNOSIS — M25572 Pain in left ankle and joints of left foot: Secondary | ICD-10-CM | POA: Diagnosis not present

## 2020-07-13 DIAGNOSIS — Z79899 Other long term (current) drug therapy: Secondary | ICD-10-CM | POA: Insufficient documentation

## 2020-07-13 DIAGNOSIS — Z794 Long term (current) use of insulin: Secondary | ICD-10-CM | POA: Insufficient documentation

## 2020-07-13 DIAGNOSIS — M79675 Pain in left toe(s): Secondary | ICD-10-CM | POA: Insufficient documentation

## 2020-07-13 DIAGNOSIS — E1169 Type 2 diabetes mellitus with other specified complication: Secondary | ICD-10-CM | POA: Insufficient documentation

## 2020-07-13 DIAGNOSIS — L03031 Cellulitis of right toe: Secondary | ICD-10-CM | POA: Diagnosis not present

## 2020-07-13 DIAGNOSIS — I10 Essential (primary) hypertension: Secondary | ICD-10-CM | POA: Insufficient documentation

## 2020-07-13 DIAGNOSIS — M25579 Pain in unspecified ankle and joints of unspecified foot: Secondary | ICD-10-CM

## 2020-07-13 DIAGNOSIS — E785 Hyperlipidemia, unspecified: Secondary | ICD-10-CM | POA: Insufficient documentation

## 2020-07-13 DIAGNOSIS — Z7982 Long term (current) use of aspirin: Secondary | ICD-10-CM | POA: Insufficient documentation

## 2020-07-13 DIAGNOSIS — L03011 Cellulitis of right finger: Secondary | ICD-10-CM | POA: Insufficient documentation

## 2020-07-13 DIAGNOSIS — Z7902 Long term (current) use of antithrombotics/antiplatelets: Secondary | ICD-10-CM | POA: Insufficient documentation

## 2020-07-13 LAB — CBG MONITORING, ED: Glucose-Capillary: 86 mg/dL (ref 70–99)

## 2020-07-13 MED ORDER — KETOROLAC TROMETHAMINE 30 MG/ML IJ SOLN
30.0000 mg | Freq: Once | INTRAMUSCULAR | Status: AC
  Administered 2020-07-13: 30 mg via INTRAMUSCULAR
  Filled 2020-07-13: qty 1

## 2020-07-13 MED ORDER — LIDOCAINE HCL 2 % IJ SOLN: 10.0000 mL | Freq: Once | INTRAMUSCULAR | Status: DC

## 2020-07-13 NOTE — ED Triage Notes (Signed)
Emergency Medicine Provider Triage Evaluation Note  Lawrence Maynard , a 55 y.o. male  was evaluated in triage.  Pt complains of ankle and foot pain. Also with finger pain.  Review of Systems  Positive: Ankle foot and finger pain Negative: fever  Physical Exam  BP 129/83 (BP Location: Left Arm)   Pulse 94   Temp 98.2 F (36.8 C) (Oral)   Resp 16   Ht 5\' 7"  (1.702 m)   Wt 99.3 kg   SpO2 95%   BMI 34.30 kg/m  Gen:   Awake, no distress   HEENT:  Atraumatic  Resp:  Normal effort  Cardiac:  Normal rate  Abd:   Nondistended,  MSK:   Moves extremities without difficulty  Neuro:  Speech clear   Medical Decision Making  Medically screening exam initiated at 7:56 PM.  Appropriate orders placed.  [name] was informed that the remainder of the evaluation will be completed by another provider, this initial triage assessment does not replace that evaluation, and the importance of remaining in the ED until their evaluation is complete.  Clinical Impression   55 y/o m with foot and ankle pain he feels is consistent with gout. He is also c/o finger pain and appears to have a paronychia.   MSE was initiated and I personally evaluated the patient and placed orders (if any) at  7:56 PM on July 13, 2020.  The patient appears stable so that the remainder of the MSE may be completed by another provider.    July 15, 2020, [name] 07/13/20 1956

## 2020-07-13 NOTE — Discharge Instructions (Signed)
Take medications as directed.  ° °Please follow up with your primary care provider within 5-7 days for re-evaluation of your symptoms. If you do not have a primary care provider, information for a healthcare clinic has been provided for you to make arrangements for follow up care. Please return to the emergency department for any new or worsening symptoms. ° °

## 2020-07-13 NOTE — ED Provider Notes (Addendum)
[facility]-EMERGENCY DEPT Provider Note   CSN: [id number] Arrival date & time: 07/13/20  1922     History Chief Complaint  Patient presents with  . Ankle Pain  . Hand Pain    Lawrence Maynard is a 55 y.o. male.  HPI   55 year old male with a history of diabetes, gout, hypertension, obesity, peripheral arterial disease, who presents to the emergency department today complaining of left ankle and left great toe pain ongoing on and off for several weeks.  He states that his symptoms are consistent with gout.  His symptoms are typically resolved with colchicine and prednisone however he is here today requesting a shot of Toradol to help with the pain until the medicine starts kicking in.  Additionally is complaining of some pain and color changes to the right thumb.  He was diagnosed with a paronychia few days ago but refused I&D at that time and was given Rx for doxycycline and advised to be seen again if symptoms did not improve.  He denies any fevers.  Past Medical History:  Diagnosis Date  . Diabetes mellitus without complication (HCC)   . Gout   . Hypertension   . Obesity   . PAD (peripheral artery disease) [facility])     Patient Active Problem List   Diagnosis Date Noted  . Insulin dependent type 2 diabetes mellitus (HCC) 05/06/2020  . Gout 05/06/2020  . Critical lower limb ischemia (HCC) 05/05/2020  . Essential hypertension 04/29/2020  . Hyperlipidemia 04/29/2020  . Peripheral arterial disease (HCC) 04/29/2020    Past Surgical History:  Procedure Laterality Date  . ABDOMINAL AORTOGRAM W/LOWER EXTREMITY Left 05/05/2020   Procedure: ABDOMINAL AORTOGRAM W/LOWER EXTREMITY;  Surgeon: Runell Gess, MD;  Location: [facility] INVASIVE CV LAB;  Service: Cardiovascular;  Laterality: Left;  . PERIPHERAL VASCULAR ATHERECTOMY Left 05/05/2020   Procedure: PERIPHERAL VASCULAR ATHERECTOMY;  Surgeon: Runell Gess, MD;  Location: [facility] INVASIVE CV LAB;  Service: Cardiovascular;   Laterality: Left;  pt trunk  . PERIPHERAL VASCULAR INTERVENTION Left 05/05/2020   Procedure: PERIPHERAL VASCULAR INTERVENTION;  Surgeon: Runell Gess, MD;  Location: [facility] INVASIVE CV LAB;  Service: Cardiovascular;  Laterality: Left;  pt trunk       Family History  Problem Relation Age of Onset  . Diabetes Mother   . Hypertension Mother   . Gout Mother   . Diabetes Father   . Hypertension Father   . Gout Father     Social History   Tobacco Use  . Smoking status: Never Smoker  . Smokeless tobacco: Never Used  Substance Use Topics  . Alcohol use: Not Currently    Home Medications Prior to Admission medications   Medication Sig Start Date End Date Taking? Authorizing Provider  allopurinol (ZYLOPRIM) 100 MG tablet Take 100 mg by mouth daily. 01/16/20   [provider]  aspirin 81 MG chewable tablet Chew 81 mg by mouth daily. 10/18/17   [provider]  carvedilol (COREG) 6.25 MG tablet Take 6.25 mg by mouth 2 (two) times daily with a meal.    [provider]  clopidogrel (PLAVIX) 75 MG tablet Take 1 tablet (75 mg total) by mouth daily. 06/27/20   [facility], Roe Rutherford, PA  colchicine 0.6 MG tablet Take 1 tablet (0.6 mg total) by mouth daily. 07/11/20   Fayrene Helper, PA-C  doxycycline (VIBRAMYCIN) 100 MG capsule Take 1 capsule (100 mg total) by mouth 2 (two) times daily. One po bid x 7 days 07/11/20  Fayrene Helper, PA-C  Empagliflozin-metFORMIN HCl (SYNJARDY) 12.07-998 MG TABS Take 1 tablet by mouth 2 (two) times daily. 02/06/20   [provider]  insulin glargine, 2 Unit Dial, (TOUJEO MAX SOLOSTAR) 300 UNIT/ML Solostar Pen Inject 75 Units into the skin daily. 11/21/19   [provider]  lisinopril (ZESTRIL) 40 MG tablet Take 40 mg by mouth daily. 01/25/08   [provider]  oxyCODONE (ROXICODONE) 5 MG immediate release tablet Take 1 tablet (5 mg total) by mouth every 4 (four) hours as needed for severe pain. 07/05/20   Dione Booze, MD   predniSONE (DELTASONE) 20 MG tablet 3 tabs po day one, then 2 tabs daily x 4 days 07/11/20   Fayrene Helper, PA-C  pregabalin (LYRICA) 50 MG capsule Take 1 capsule (50 mg total) by mouth 2 (two) times daily. 04/23/20   Wallis Bamberg, PA-C  rosuvastatin (CRESTOR) 20 MG tablet Take 20 mg by mouth at bedtime. 04/18/20   [provider]  sildenafil (VIAGRA) 50 MG tablet Take 50 mg by mouth daily as needed for erectile dysfunction. 04/18/20   [provider]  traMADol (ULTRAM) 50 MG tablet Take 1 tablet (50 mg total) by mouth 3 (three) times daily. 04/25/20   Lenn Sink, DPM    Allergies    Patient has no known allergies.  Review of Systems   Review of Systems  Constitutional: Negative for fever.  Musculoskeletal:       Ankle/foot pain, finger pain  Skin: Positive for wound.  Neurological: Negative for weakness and numbness.    Physical Exam Updated Vital Signs BP 129/83 (BP Location: Left Arm)   Pulse 94   Temp 98.2 F (36.8 C) (Oral)   Resp 16   Ht 5\' 7"  (1.702 m)   Wt 99.3 kg   SpO2 95%   BMI 34.30 kg/m   Physical Exam Constitutional:      General: He is not in acute distress.    Appearance: He is well-developed.  Eyes:     Conjunctiva/sclera: Conjunctivae normal.  Cardiovascular:     Rate and Rhythm: Normal rate and regular rhythm.  Pulmonary:     Effort: Pulmonary effort is normal.     Breath sounds: Normal breath sounds.  Musculoskeletal:     Comments: TTP to the ankle and to the left great toe. ttp to the tip of the right thumb with paronychia noted. dp pulse intact.  Skin:    General: Skin is warm and dry.  Neurological:     Mental Status: He is alert and oriented to person, place, and time.     ED Results / Procedures / Treatments   Labs (all labs ordered are listed, but only abnormal results are displayed) Labs Reviewed  CBG MONITORING, ED    EKG None  Radiology DG Ankle Complete Left  Result Date: 07/13/2020 CLINICAL DATA:  Ankle  pain EXAM: LEFT ANKLE COMPLETE - 3+ VIEW COMPARISON:  None. FINDINGS: Mild spurring in the ankle joint and joint space narrowing. No acute bony abnormality. Specifically, no fracture, subluxation, or dislocation. Diffuse vascular calcifications. IMPRESSION: No acute bony abnormality. Electronically Signed   By: 07/15/2020 M.D.   On: 07/13/2020 20:24   DG Foot Complete Left  Result Date: 07/13/2020 CLINICAL DATA:  Left foot and ankle pain EXAM: LEFT FOOT - COMPLETE 3+ VIEW COMPARISON:  None. FINDINGS: Degenerative changes of the 1st MTP joint with joint space narrowing and spurring. No acute bony abnormality. Specifically, no fracture, subluxation, or dislocation.  Diffuse vascular calcifications. IMPRESSION: No acute bony abnormality. Electronically Signed   By: Charlett Nose M.D.   On: 07/13/2020 20:23    Procedures .Marland KitchenIncision and Drainage  Date/Time: 07/13/2020 9:43 PM Performed by: [name], PA-C Authorized by: [name], PA-C   Consent:    Consent obtained:  Verbal   Consent given by:  Patient   Risks, benefits, and alternatives were discussed: yes     Risks discussed:  Bleeding, incomplete drainage and pain   Alternatives discussed:  No treatment Universal protocol:    Procedure explained and questions answered to patient or proxy's satisfaction: yes     Immediately prior to procedure, a time out was called: yes     Patient identity confirmed:  Verbally with patient Location:    Indications for incision and drainage: paronychia.   Size:  .5cm   Location: finger. Pre-procedure details:    Skin preparation:  Povidone-iodine Sedation:    Sedation type:  None Anesthesia:    Anesthesia method: biofreeze spray. Procedure type:    Complexity:  Simple Procedure details:    Incision types:  Stab incision   Incision depth:  Dermal   Wound management:  Probed and deloculated   Drainage:  Purulent   Drainage amount:  Scant   Wound treatment:  Wound left open    Packing materials:  None Post-procedure details:    Procedure completion:  Tolerated     Medications Ordered in ED Medications  lidocaine (XYLOCAINE) 2 % (with pres) injection 200 mg (200 mg Intradermal Not Given 07/13/20 2141)  ketorolac (TORADOL) 30 MG/ML injection 30 mg (30 mg Intramuscular Given 07/13/20 2143)    ED Course  I have reviewed the triage vital signs and the nursing notes.  Pertinent labs & imaging results that were available during my care of the patient were reviewed by me and considered in my medical decision making (see chart for details).    MDM Rules/Calculators/A&P                          55 year old male presenting the emergency department today for evaluation of left ankle and great toe pain consistent with gout and also paronychia to the right thumb.  He was already seen 2 days ago and diagnosed with gout and given prescription for prednisone and colchicine.  He simply requesting a shot of Toradol to help with his pain.  This was given in the ED.  In regards to his paronychia this was incised and drained.  He was advised to continue taking the antibiotics and return to the ED for any new or worsening symptoms   Final Clinical Impression(s) / ED Diagnoses Final diagnoses:  Acute ankle pain, unspecified laterality  Great toe pain, left  Paronychia of thumb, right    Rx / DC Orders ED Discharge Orders    None       [name], PA-C 07/13/20 2124    [name], PA-C 07/13/20 2144    Terrilee Files, MD 07/14/20 1002

## 2020-07-13 NOTE — ED Triage Notes (Signed)
Patient presents with a right thumb paronychia and left ankle pain due to gout. Patient states he just started work and has not had insurance. Patient seen x2 days ago for gout and evaluated.

## 2020-07-23 ENCOUNTER — Telehealth: Payer: Self-pay | Admitting: Cardiovascular Disease

## 2020-07-23 DIAGNOSIS — I739 Peripheral vascular disease, unspecified: Secondary | ICD-10-CM

## 2020-07-23 MED ORDER — CLOPIDOGREL BISULFATE 75 MG PO TABS: 75.0000 mg | ORAL_TABLET | Freq: Every day | ORAL | 0 refills | Status: DC

## 2020-07-23 NOTE — Telephone Encounter (Signed)
Pt c/o medication issue:  1. Name of Medication: clopidogrel (PLAVIX) 75 MG tablet  2. How are you currently taking this medication (dosage and times per day)? As written  3. Are you having a reaction (difficulty breathing--STAT)? No    4. What is your medication issue? Patient is on assignment for work and needs a prescription sent from where he's located in [place], [place]. Please send a new subscription to [facility] [address] [place] - [address].

## 2020-07-23 NOTE — Telephone Encounter (Signed)
Spoke to patient stated he is working in [place] this week.Stated he forgot his Plavix.Refill sent to [facility] in [place].

## 2020-07-29 DIAGNOSIS — M1A9XX0 Chronic gout, unspecified, without tophus (tophi): Secondary | ICD-10-CM | POA: Diagnosis not present

## 2020-07-29 DIAGNOSIS — M10072 Idiopathic gout, left ankle and foot: Secondary | ICD-10-CM | POA: Diagnosis not present

## 2020-07-29 DIAGNOSIS — M79672 Pain in left foot: Secondary | ICD-10-CM | POA: Diagnosis not present

## 2020-07-29 DIAGNOSIS — L539 Erythematous condition, unspecified: Secondary | ICD-10-CM | POA: Diagnosis not present

## 2020-08-05 DIAGNOSIS — K0889 Other specified disorders of teeth and supporting structures: Secondary | ICD-10-CM | POA: Diagnosis not present

## 2020-08-05 DIAGNOSIS — K029 Dental caries, unspecified: Secondary | ICD-10-CM | POA: Diagnosis not present

## 2020-08-05 DIAGNOSIS — M25562 Pain in left knee: Secondary | ICD-10-CM | POA: Diagnosis not present

## 2020-08-15 ENCOUNTER — Ambulatory Visit: Payer: BC Managed Care – PPO | Admitting: Medical

## 2020-08-15 ENCOUNTER — Encounter: Payer: Self-pay | Admitting: Medical

## 2020-08-15 ENCOUNTER — Other Ambulatory Visit: Payer: Self-pay

## 2020-08-15 VITALS — BP 140/100 | HR 87 | Ht 67.0 in | Wt 206.4 lb

## 2020-08-15 DIAGNOSIS — E785 Hyperlipidemia, unspecified: Secondary | ICD-10-CM

## 2020-08-15 DIAGNOSIS — I10 Essential (primary) hypertension: Secondary | ICD-10-CM | POA: Diagnosis not present

## 2020-08-15 DIAGNOSIS — Z8673 Personal history of transient ischemic attack (TIA), and cerebral infarction without residual deficits: Secondary | ICD-10-CM

## 2020-08-15 DIAGNOSIS — M1A9XX0 Chronic gout, unspecified, without tophus (tophi): Secondary | ICD-10-CM

## 2020-08-15 DIAGNOSIS — E1165 Type 2 diabetes mellitus with hyperglycemia: Secondary | ICD-10-CM | POA: Diagnosis not present

## 2020-08-15 DIAGNOSIS — R251 Tremor, unspecified: Secondary | ICD-10-CM

## 2020-08-15 DIAGNOSIS — I739 Peripheral vascular disease, unspecified: Secondary | ICD-10-CM

## 2020-08-15 DIAGNOSIS — N529 Male erectile dysfunction, unspecified: Secondary | ICD-10-CM | POA: Diagnosis not present

## 2020-08-15 LAB — POCT URINALYSIS DIP (PROADVANTAGE DEVICE)
Bilirubin, UA: NEGATIVE
Blood, UA: NEGATIVE
Glucose, UA: >=1000 mg/dL — AB
Ketones, POC UA: NEGATIVE mg/dL
Leukocytes, UA: NEGATIVE
Nitrite, UA: NEGATIVE
Protein Ur, POC: NEGATIVE mg/dL
Specific Gravity, Urine: 1.02
Urobilinogen, Ur: 0.2
pH, UA: 6 (ref 5.0–8.0)

## 2020-08-15 NOTE — Progress Notes (Signed)
Subjective:  Lawrence Maynard is a 55 y.o. male who presents for Chief Complaint  Patient presents with  . New Patient (Initial Visit)     Here as a new patient today.  Medical team: Lawrence Maynard, cardiology  (Chart record shows numerous Emergency Department visits in recent months)  Here to establish with primary care.  Was seeing PCP in [place], [place] last year.  Had some medication refills [facility]  Lawrence Maynard has a medical history significant for diabetes, high blood pressure, hyperlipidemia, gout, peripheral arterial disease.  He notes history stroke age 65.  Had stent put in left leg from Lawrence Maynard in last year .    Still having problems with his left leg and foot.   He notes significant ED problems.  Has trouble getting and keeping erection x last 4 years.  He notes some off balance feeling x 8-9 months.    HTN - does not checks BPs at home.  Takes Carvedilol 6.25mg  BID and Lisinopril 40mg  daily.  Diabetes - taking Ozempic 0.5mg  weekly x 7 months, on Synjardy 12.5/1000mg  once daily.   Fasting glucose 140-230.   If sugars high over 200, will do 20 units, and uses Toujeo 75u daily.  Hyperlipidemia - on Crestor 20mg  daily  Uses aspirin 81mg  daily and plavix 75mg  daily.   Has gout, takes prednisone occasionally for flare up.  He denies alcohol use or smoking.  No other aggravating or relieving factors.    No other c/o.  Past Medical History:  Diagnosis Date  . Diabetes mellitus without complication (HCC)   . Gout   . Hypertension   . Obesity   . PAD (peripheral artery disease) (HCC)    Current Outpatient Medications on File Prior to Visit  Medication Sig Dispense Refill  . carvedilol (COREG) 6.25 MG tablet Take 6.25 mg by mouth 2 (two) times daily with a meal.    . clopidogrel (PLAVIX) 75 MG tablet Take 1 tablet (75 mg total) by mouth daily. 30 tablet 0  . allopurinol (ZYLOPRIM) 100 MG tablet Take 100 mg by mouth daily. (Patient not taking:  Reported on 08/15/2020)    . aspirin 81 MG chewable tablet Chew 81 mg by mouth daily. (Patient not taking: Reported on 08/15/2020)    . colchicine 0.6 MG tablet Take 1 tablet (0.6 mg total) by mouth daily. 20 tablet 0  . doxycycline (VIBRAMYCIN) 100 MG capsule Take 1 capsule (100 mg total) by mouth 2 (two) times daily. One po bid x 7 days 14 capsule 0  . Empagliflozin-metFORMIN HCl (SYNJARDY) 12.07-998 MG TABS Take 1 tablet by mouth 2 (two) times daily.    . insulin glargine, 2 Unit Dial, (TOUJEO MAX SOLOSTAR) 300 UNIT/ML Solostar Pen Inject 75 Units into the skin daily.    lisinopril (ZESTRIL) 40 MG tablet Take 40 mg by mouth daily.    Maynard oxyCODONE (ROXICODONE) 5 MG immediate release tablet Take 1 tablet (5 mg total) by mouth every 4 (four) hours as needed for severe pain. 10 tablet 0  . predniSONE (DELTASONE) 20 MG tablet 3 tabs po day one, then 2 tabs daily x 4 days 11 tablet 0  . pregabalin (LYRICA) 50 MG capsule Take 1 capsule (50 mg total) by mouth 2 (two) times daily. 30 capsule 0  . rosuvastatin (CRESTOR) 20 MG tablet Take 20 mg by mouth at bedtime.    . sildenafil (VIAGRA) 50 MG tablet Take 50 mg by mouth daily as needed for erectile  dysfunction.    . traMADol (ULTRAM) 50 MG tablet Take 1 tablet (50 mg total) by mouth 3 (three) times daily. 90 tablet 2   Current Facility-Administered Medications on File Prior to Visit  Medication Dose Route Frequency Provider Last Rate Last Admin  . triamcinolone acetonide (KENALOG) 10 MG/ML injection 10 mg  10 mg Other Once Regal, Norman S, DPM         The following portions of the patient's history were reviewed and updated as appropriate: allergies, current medications, past family history, past medical history, past social history, past surgical history and problem list.  ROS Otherwise as in subjective above     Objective: BP (!) 140/100   Pulse 87   Ht 5\' 7"  (1.702 m)   Wt 206 lb 6.4 oz (93.6 kg)   SpO2 96%   BMI 32.33 kg/m   BP  Readings from Last 3 Encounters:  08/15/20 (!) 140/100  07/13/20 (!) 156/85  07/11/20 (!) 120/96   Wt Readings from Last 3 Encounters:  08/15/20 206 lb 6.4 oz (93.6 kg)  07/13/20 219 lb (99.3 kg)  07/05/20 219 lb (99.3 kg)    General appearance: alert, no distress, well developed, well nourished, African American male Neck: supple, no lymphadenopathy, no thyromegaly, no masses, no bruits Heart: RRR, normal S1, S2, no murmurs Lungs: CTA bilaterally, no wheezes, rhonchi, or rales Pulses: 1+ upper and faint pulses in pedal pulses Ext: no edema Neuro: CN II through XII intact, he seemed a little unsteady leaning to the left on Romberg but otherwise unremarkable nonfocal exam Psych: Pleasant, answers questions appropriately  Diabetic Foot Exam - Simple   Simple Foot Form Diabetic Foot exam was performed with the following findings: Yes 08/15/2020 10:19 AM  Visual Inspection See comments: Yes Sensation Testing See comments: Yes Pulse Check See comments: Yes Comments Barely palpable pulses in the leg but no cold leg or cold extremity, decreased cap refill of the toes, decreased sensation slightly on the left foot otherwise normal monofilament sensation, and there are scars from old sores on the bilateral lower legs in general       Assessment: Encounter Diagnoses  Name Primary?  . Essential hypertension, benign Yes  . Uncontrolled type 2 diabetes mellitus with hyperglycemia (HCC)   . Hyperlipidemia, unspecified hyperlipidemia type   . Erectile dysfunction, unspecified erectile dysfunction type   . History of stroke   . Chronic gout without tophus, unspecified cause, unspecified site   . Shakes   . Peripheral arterial disease (HCC)      Plan: We discussed his symptoms and concerns.  I reviewed over his health history and medications.  He appears to have uncontrolled diabetes and uncontrolled high blood pressure  Updated labs as below today.  Continue current medications.   He will need a refill on Toujeo and Ozempic soon if we continue these at the same dose.  Otherwise he notes refills on other medications.  Follow-up with Lawrence Maynard concerning peripheral arterial disease and uncontrolled high blood pressure.  Erectile dysfunction- I advised I would contact Lawrence Maynard to make sure his heart is healthy enough for sexual activity.  If so then we will do a trial of sildenafil generic.  We discussed other options.  We discussed the risk and benefits of medication  History of stroke, ataxia-pending labs consider updated carotid ultrasound and possibly head imaging going forward  Shakiness of legs-possibly related to peripheral arterial disease.  Labs including B12 today  Chronic gout-update uric acid level  today  Glyn was seen today for new patient (initial visit).  Diagnoses and all orders for this visit:  Essential hypertension, benign -     Hepatic function panel  Uncontrolled type 2 diabetes mellitus with hyperglycemia (HCC) -     Hepatic function panel -     Hemoglobin A1c -     POCT Urinalysis DIP (Proadvantage Device) -     Microalbumin/Creatinine Ratio, Urine  Hyperlipidemia, unspecified hyperlipidemia type -     Lipid panel  Erectile dysfunction, unspecified erectile dysfunction type  History of stroke -     Lipid panel  Chronic gout without tophus, unspecified cause, unspecified site -     Uric acid  Shakes -     Vitamin B12  Peripheral arterial disease (HCC)    Follow up: pending labs

## 2020-08-16 LAB — HEMOGLOBIN A1C
Est. average glucose Bld gHb Est-mCnc: 214 mg/dL
Hgb A1c MFr Bld: 9.1 % — ABNORMAL HIGH (ref 4.8–5.6)

## 2020-08-16 LAB — HEPATIC FUNCTION PANEL
ALT: 23 IU/L (ref 0–44)
AST: 18 IU/L (ref 0–40)
Albumin: 4.4 g/dL (ref 3.8–4.9)
Alkaline Phosphatase: 73 IU/L (ref 44–121)
Bilirubin Total: 0.5 mg/dL (ref 0.0–1.2)
Bilirubin, Direct: 0.2 mg/dL (ref 0.00–0.40)
Total Protein: 7.4 g/dL (ref 6.0–8.5)

## 2020-08-16 LAB — LIPID PANEL
Chol/HDL Ratio: 3.2 ratio (ref 0.0–5.0)
Cholesterol, Total: 182 mg/dL (ref 100–199)
HDL: 57 mg/dL (ref >39)
LDL Chol Calc (NIH): 100 mg/dL — ABNORMAL HIGH (ref 0–99)
Triglycerides: 141 mg/dL (ref 0–149)
VLDL Cholesterol Cal: 25 mg/dL (ref 5–40)

## 2020-08-16 LAB — MICROALBUMIN / CREATININE URINE RATIO
Creatinine, Urine: 77.6 mg/dL
Microalb/Creat Ratio: 35 mg/g creat — ABNORMAL HIGH (ref 0–29)
Microalbumin, Urine: 27.5 ug/mL

## 2020-08-16 LAB — URIC ACID: Uric Acid: 8.4 mg/dL (ref 3.8–8.4)

## 2020-08-16 LAB — VITAMIN B12: Vitamin B-12: 1271 pg/mL — ABNORMAL HIGH (ref 232–1245)

## 2020-08-18 ENCOUNTER — Other Ambulatory Visit: Payer: Self-pay | Admitting: Medical

## 2020-08-18 DIAGNOSIS — I739 Peripheral vascular disease, unspecified: Secondary | ICD-10-CM

## 2020-08-18 MED ORDER — BD PEN NEEDLE NANO U/F 32G X 4 MM MISC: 1.0000 | Freq: Every day | 11 refills | Status: AC

## 2020-08-18 MED ORDER — OZEMPIC (1 MG/DOSE) 4 MG/3ML ~~LOC~~ SOPN: 1.0000 mg | PEN_INJECTOR | SUBCUTANEOUS | 5 refills | Status: DC

## 2020-08-18 MED ORDER — ALLOPURINOL 300 MG PO TABS
300.0000 mg | ORAL_TABLET | Freq: Every day | ORAL | 3 refills | Status: DC
Start: 2020-08-18 — End: 2020-12-07

## 2020-08-18 MED ORDER — SYNJARDY 12.5-1000 MG PO TABS
1.0000 | ORAL_TABLET | Freq: Two times a day (BID) | ORAL | 1 refills | Status: AC
Start: 2020-08-18 — End: ?

## 2020-08-18 MED ORDER — REPATHA 140 MG/ML ~~LOC~~ SOSY: 140.0000 mg | PREFILLED_SYRINGE | SUBCUTANEOUS | 11 refills | Status: DC

## 2020-08-18 MED ORDER — PREGABALIN 50 MG PO CAPS: 50.0000 mg | ORAL_CAPSULE | Freq: Two times a day (BID) | ORAL | 0 refills | Status: DC

## 2020-08-18 MED ORDER — TOUJEO MAX SOLOSTAR 300 UNIT/ML ~~LOC~~ SOPN
80.0000 [IU] | PEN_INJECTOR | Freq: Every day | SUBCUTANEOUS | 3 refills | Status: DC
Start: 2020-08-18 — End: 2020-12-07

## 2020-08-18 MED ORDER — ROSUVASTATIN CALCIUM 20 MG PO TABS: 20.0000 mg | ORAL_TABLET | Freq: Every day | ORAL | 3 refills | Status: DC

## 2020-08-18 MED ORDER — ASPIRIN 81 MG PO CHEW: 81.0000 mg | CHEWABLE_TABLET | Freq: Every day | ORAL | 3 refills | Status: AC

## 2020-08-18 MED ORDER — SILDENAFIL CITRATE 100 MG PO TABS: 100.0000 mg | ORAL_TABLET | ORAL | 1 refills | Status: AC | PRN

## 2020-08-20 ENCOUNTER — Encounter: Payer: Self-pay | Admitting: Medical

## 2020-08-20 DIAGNOSIS — N528 Other male erectile dysfunction: Secondary | ICD-10-CM | POA: Diagnosis not present

## 2020-09-12 DIAGNOSIS — M25562 Pain in left knee: Secondary | ICD-10-CM | POA: Diagnosis not present

## 2020-09-12 DIAGNOSIS — Z8739 Personal history of other diseases of the musculoskeletal system and connective tissue: Secondary | ICD-10-CM | POA: Diagnosis not present

## 2020-09-12 DIAGNOSIS — M10062 Idiopathic gout, left knee: Secondary | ICD-10-CM | POA: Diagnosis not present

## 2020-09-12 DIAGNOSIS — R2689 Other abnormalities of gait and mobility: Secondary | ICD-10-CM | POA: Diagnosis not present

## 2020-09-19 ENCOUNTER — Telehealth: Payer: Self-pay | Admitting: Cardiovascular Disease

## 2020-09-19 DIAGNOSIS — I739 Peripheral vascular disease, unspecified: Secondary | ICD-10-CM

## 2020-09-19 MED ORDER — CLOPIDOGREL BISULFATE 75 MG PO TABS: 75.0000 mg | ORAL_TABLET | Freq: Every day | ORAL | 1 refills | Status: DC

## 2020-09-19 MED ORDER — LISINOPRIL 40 MG PO TABS: 40.0000 mg | ORAL_TABLET | Freq: Every day | ORAL | 1 refills | Status: DC

## 2020-09-19 NOTE — Telephone Encounter (Signed)
*  STAT* If patient is at the pharmacy, call can be transferred to refill team.   1. Which medications need to be refilled? (please list name of each medication and dose if known) clopidogrel (PLAVIX) 75 MG tablet lisinopril (ZESTRIL) 40 MG tablet rosuvastatin (CRESTOR) 20 MG tablet   2. Which pharmacy/location (including street and city if local pharmacy) is medication to be sent to? [facility] - [place] - [address]  3. Do they need a 30 day or 90 day supply? 30

## 2020-09-23 ENCOUNTER — Telehealth: Payer: Self-pay | Admitting: Cardiovascular Disease

## 2020-09-23 DIAGNOSIS — I739 Peripheral vascular disease, unspecified: Secondary | ICD-10-CM

## 2020-09-23 DIAGNOSIS — M10462 Other secondary gout, left knee: Secondary | ICD-10-CM | POA: Diagnosis not present

## 2020-09-23 DIAGNOSIS — M25562 Pain in left knee: Secondary | ICD-10-CM | POA: Diagnosis not present

## 2020-09-23 NOTE — Telephone Encounter (Signed)
*  STAT* If patient is at the pharmacy, call can be transferred to refill team.   1. Which medications need to be refilled? (please list name of each medication and dose if known) Plavix  rosuvastatin, linsinpril   2. Which pharmacy/location (including street and city if local pharmacy) is medication to be sent to? [facility] in [place]  3. Do they need a 30 day or 90 day supply? 30

## 2020-09-24 MED ORDER — CLOPIDOGREL BISULFATE 75 MG PO TABS: 75.0000 mg | ORAL_TABLET | Freq: Every day | ORAL | 1 refills | Status: AC

## 2020-09-24 MED ORDER — CLOPIDOGREL BISULFATE 75 MG PO TABS: 75.0000 mg | ORAL_TABLET | Freq: Every day | ORAL | 1 refills | Status: DC

## 2020-09-24 MED ORDER — ROSUVASTATIN CALCIUM 20 MG PO TABS: 20.0000 mg | ORAL_TABLET | Freq: Every day | ORAL | 1 refills | Status: AC

## 2020-09-24 MED ORDER — LISINOPRIL 40 MG PO TABS: 40.0000 mg | ORAL_TABLET | Freq: Every day | ORAL | 1 refills | Status: AC

## 2020-09-24 MED ORDER — ROSUVASTATIN CALCIUM 20 MG PO TABS: 20.0000 mg | ORAL_TABLET | Freq: Every day | ORAL | 3 refills | Status: DC

## 2020-09-24 MED ORDER — LISINOPRIL 40 MG PO TABS: 40.0000 mg | ORAL_TABLET | Freq: Every day | ORAL | 1 refills | Status: DC

## 2020-09-24 NOTE — Addendum Note (Signed)
Addended by: Johney Frame A on: 09/24/2020 04:22 PM   Modules accepted: Orders

## 2020-09-24 NOTE — Telephone Encounter (Signed)
Prescriptions sent to pharmacy

## 2020-09-24 NOTE — Telephone Encounter (Signed)
*  STAT* If patient is at the pharmacy, call can be transferred to refill team.   1. Which medications need to be refilled? (please list name of each medication and dose if known) clopidogrel (PLAVIX) 75 MG tablet rosuvastatin (CRESTOR) 20 MG tablet lisinopril (ZESTRIL) 40 MG tablet  2. Which pharmacy/location (including street and city if local pharmacy) is medication to be sent to? [facility] [address] [place], [place] [address]  3. Do they need a 30 day or 90 day supply? 90 day supply  Insurance will not cover it through [facility].

## 2020-09-24 NOTE — Telephone Encounter (Signed)
Follow up:     Patient has call a few times this morning concering his medication. Patient is working out of town and states he is out of his medication. Please advise.

## 2020-09-25 NOTE — Telephone Encounter (Signed)
Rx(s) sent to pharmacy electronically.  

## 2020-09-30 ENCOUNTER — Ambulatory Visit: Payer: BC Managed Care – PPO | Admitting: Medical

## 2020-10-04 ENCOUNTER — Other Ambulatory Visit: Payer: Self-pay

## 2020-10-04 ENCOUNTER — Encounter (HOSPITAL_COMMUNITY): Payer: Self-pay

## 2020-10-04 ENCOUNTER — Emergency Department (HOSPITAL_COMMUNITY)
Admission: EM | Admit: 2020-10-04 | Discharge: 2020-10-05 | Discharge status: Against Medical Advice | Payer: BC Managed Care – PPO | Attending: Emergency Medicine | Admitting: Emergency Medicine

## 2020-10-04 DIAGNOSIS — Z7902 Long term (current) use of antithrombotics/antiplatelets: Secondary | ICD-10-CM | POA: Insufficient documentation

## 2020-10-04 DIAGNOSIS — Z794 Long term (current) use of insulin: Secondary | ICD-10-CM | POA: Insufficient documentation

## 2020-10-04 DIAGNOSIS — E1151 Type 2 diabetes mellitus with diabetic peripheral angiopathy without gangrene: Secondary | ICD-10-CM | POA: Insufficient documentation

## 2020-10-04 DIAGNOSIS — Z79899 Other long term (current) drug therapy: Secondary | ICD-10-CM | POA: Insufficient documentation

## 2020-10-04 DIAGNOSIS — E1169 Type 2 diabetes mellitus with other specified complication: Secondary | ICD-10-CM | POA: Diagnosis not present

## 2020-10-04 DIAGNOSIS — Z7982 Long term (current) use of aspirin: Secondary | ICD-10-CM | POA: Insufficient documentation

## 2020-10-04 DIAGNOSIS — E785 Hyperlipidemia, unspecified: Secondary | ICD-10-CM | POA: Insufficient documentation

## 2020-10-04 DIAGNOSIS — Z7984 Long term (current) use of oral hypoglycemic drugs: Secondary | ICD-10-CM | POA: Diagnosis not present

## 2020-10-04 DIAGNOSIS — I10 Essential (primary) hypertension: Secondary | ICD-10-CM | POA: Insufficient documentation

## 2020-10-04 DIAGNOSIS — M109 Gout, unspecified: Secondary | ICD-10-CM | POA: Diagnosis not present

## 2020-10-04 DIAGNOSIS — M79673 Pain in unspecified foot: Secondary | ICD-10-CM | POA: Diagnosis present

## 2020-10-04 NOTE — ED Triage Notes (Signed)
Pt reports increased right foot pain due to gout in right big toe.

## 2020-10-05 ENCOUNTER — Encounter (HOSPITAL_COMMUNITY): Payer: Self-pay | Admitting: Emergency Medicine

## 2020-10-05 MED ORDER — PREDNISONE 20 MG PO TABS: ORAL_TABLET | ORAL | 0 refills | Status: DC

## 2020-10-05 MED ORDER — PREDNISONE 20 MG PO TABS
60.0000 mg | ORAL_TABLET | Freq: Once | ORAL | Status: AC
  Administered 2020-10-05: 60 mg via ORAL
  Filled 2020-10-05: qty 3

## 2020-10-05 NOTE — ED Provider Notes (Signed)
[facility]-EMERGENCY DEPT Provider Note   CSN: [id number] Arrival date & time: 10/04/20  2125     History Chief Complaint  Patient presents with   Foot Pain    Lawrence Maynard is a 55 y.o. male.  The history is provided by the patient. The history is limited by the condition of the patient.  Foot Pain This is a recurrent problem. The current episode started more than 2 days ago. The problem occurs constantly. The problem has not changed since onset.Pertinent negatives include no chest pain, no abdominal pain, no headaches and no shortness of breath. Nothing aggravates the symptoms. Nothing relieves the symptoms. He has tried nothing for the symptoms. The treatment provided no relief.  Patient with known gout with a flare.      Past Medical History:  Diagnosis Date   Diabetes mellitus without complication (HCC)    Gout    Hypertension    Obesity    PAD (peripheral artery disease) (HCC)     Patient Active Problem List   Diagnosis Date Noted   Essential hypertension, benign 08/15/2020   Erectile dysfunction 08/15/2020   Uncontrolled type 2 diabetes mellitus with hyperglycemia (HCC) 08/15/2020   History of stroke 08/15/2020   Chronic gout without tophus 08/15/2020   Shakes 08/15/2020   Insulin dependent type 2 diabetes mellitus (HCC) 05/06/2020   Gout 05/06/2020   Critical lower limb ischemia (HCC) 05/05/2020   Essential hypertension 04/29/2020   Hyperlipidemia 04/29/2020   Peripheral arterial disease (HCC) 04/29/2020    Past Surgical History:  Procedure Laterality Date   ABDOMINAL AORTOGRAM W/LOWER EXTREMITY Left 05/05/2020   Procedure: ABDOMINAL AORTOGRAM W/LOWER EXTREMITY;  Surgeon: Runell Gess, MD;  Location: [facility] INVASIVE CV LAB;  Service: Cardiovascular;  Laterality: Left;   PERIPHERAL VASCULAR ATHERECTOMY Left 05/05/2020   Procedure: PERIPHERAL VASCULAR ATHERECTOMY;  Surgeon: Runell Gess, MD;  Location: [facility] INVASIVE CV LAB;  Service:  Cardiovascular;  Laterality: Left;  pt trunk   PERIPHERAL VASCULAR INTERVENTION Left 05/05/2020   Procedure: PERIPHERAL VASCULAR INTERVENTION;  Surgeon: Runell Gess, MD;  Location: [facility] INVASIVE CV LAB;  Service: Cardiovascular;  Laterality: Left;  pt trunk       Family History  Problem Relation Age of Onset   Diabetes Mother    Hypertension Mother    Gout Mother    Diabetes Father    Hypertension Father    Gout Father     Social History   Tobacco Use   Smoking status: Never   Smokeless tobacco: Never  Substance Use Topics   Alcohol use: Not Currently    Home Medications Prior to Admission medications   Medication Sig Start Date End Date Taking? Authorizing Provider  allopurinol (ZYLOPRIM) 300 MG tablet Take 1 tablet (300 mg total) by mouth daily. 08/18/20 08/18/21  Tysinger, Kermit Balo, PA-C  aspirin 81 MG chewable tablet Chew 1 tablet (81 mg total) by mouth daily. 08/18/20   Tysinger, Kermit Balo, PA-C  carvedilol (COREG) 6.25 MG tablet Take 6.25 mg by mouth 2 (two) times daily with a meal.    [provider]  clopidogrel (PLAVIX) 75 MG tablet Take 1 tablet (75 mg total) by mouth daily. 09/24/20   Runell Gess, MD  Empagliflozin-metFORMIN HCl (SYNJARDY) 12.07-998 MG TABS Take 1 tablet by mouth 2 (two) times daily. 08/18/20   Tysinger, Kermit Balo, PA-C  Evolocumab (REPATHA) 140 MG/ML SOSY Inject 140 mg into the skin every 14 (fourteen) days. 08/18/20   Tysinger, Kermit Balo,  PA-C  insulin glargine, 2 Unit Dial, (TOUJEO MAX SOLOSTAR) 300 UNIT/ML Solostar Pen Inject 80 Units into the skin daily. 08/18/20   Tysinger, Kermit Balo, PA-C  Insulin Pen Needle (BD PEN NEEDLE NANO U/F) 32G X 4 MM MISC 1 each by Does not apply route at bedtime. 08/18/20   Tysinger, Kermit Balo, PA-C  lisinopril (ZESTRIL) 40 MG tablet Take 1 tablet (40 mg total) by mouth daily. 09/24/20   Runell Gess, MD  pregabalin (LYRICA) 50 MG capsule Take 1 capsule (50 mg total) by mouth 2 (two) times daily. 08/18/20    Tysinger, Kermit Balo, PA-C  rosuvastatin (CRESTOR) 20 MG tablet Take 1 tablet (20 mg total) by mouth at bedtime. 09/24/20   Runell Gess, MD  Semaglutide, 1 MG/DOSE, (OZEMPIC, 1 MG/DOSE,) 4 MG/3ML SOPN Inject 1 mg into the skin once a week. 08/18/20   Tysinger, Kermit Balo, PA-C  sildenafil (VIAGRA) 100 MG tablet Take 1 tablet (100 mg total) by mouth as needed for erectile dysfunction. 08/18/20 08/18/21  Tysinger, Kermit Balo, PA-C  traMADol (ULTRAM) 50 MG tablet Take 1 tablet (50 mg total) by mouth 3 (three) times daily. 04/25/20   Lenn Sink, DPM    Allergies    Patient has no known allergies.  Review of Systems   Review of Systems  Constitutional:  Negative for fever.  HENT:  Negative for drooling.   Eyes:  Negative for redness.  Respiratory:  Negative for shortness of breath.   Cardiovascular:  Negative for chest pain.  Gastrointestinal:  Negative for abdominal pain.  Genitourinary:  Negative for difficulty urinating.  Musculoskeletal:  Positive for arthralgias and joint swelling.  Skin:  Negative for rash.  Neurological:  Negative for headaches.  Psychiatric/Behavioral:  Negative for agitation.   All other systems reviewed and are negative.  Physical Exam Updated Vital Signs BP (!) 163/93 (BP Location: Right Arm)   Pulse 73   Temp 98.3 F (36.8 C) (Oral)   Resp 16   Ht 5\' 7"  (1.702 m)   Wt 95.7 kg   SpO2 96%   BMI 33.05 kg/m   Physical Exam Vitals and nursing note reviewed.  Constitutional:      General: He is not in acute distress.    Appearance: Normal appearance.  HENT:     Head: Normocephalic and atraumatic.     Nose: Nose normal.  Eyes:     Conjunctiva/sclera: Conjunctivae normal.     Pupils: Pupils are equal, round, and reactive to light.  Cardiovascular:     Rate and Rhythm: Normal rate and regular rhythm.     Pulses: Normal pulses.     Heart sounds: Normal heart sounds.  Pulmonary:     Effort: Pulmonary effort is normal.     Breath sounds: Normal breath  sounds.  Abdominal:     General: Abdomen is flat. Bowel sounds are normal.     Palpations: Abdomen is soft.     Tenderness: There is no abdominal tenderness. There is no guarding.  Musculoskeletal:        General: Normal range of motion.     Cervical back: Normal range of motion and neck supple.  Skin:    General: Skin is warm and dry.     Capillary Refill: Capillary refill takes less than 2 seconds.  Neurological:     General: No focal deficit present.     Mental Status: He is alert and oriented to person, place, and time.     Deep  Tendon Reflexes: Reflexes normal.  Psychiatric:        Mood and Affect: Mood normal.        Behavior: Behavior normal.    ED Results / Procedures / Treatments   Labs (all labs ordered are listed, but only abnormal results are displayed) Labs Reviewed - No data to display  EKG None  Radiology No results found.  Procedures Procedures   Medications Ordered in ED Medications  predniSONE (DELTASONE) tablet 60 mg (60 mg Oral Given 10/05/20 0049)    ED Course  I have reviewed the triage vital signs and the nursing notes.   Treated for gout in the ED.    Pertinent labs & imaging results that were available during my care of the patient were reviewed by me and considered in my  Final Clinical Impression(s) / ED Diagnoses Final diagnoses:  Acute gout involving toe, unspecified cause, unspecified laterality   Eloped during the treatment  Rx / DC Orders ED Discharge Orders     None        Shequilla Goodgame, MD 10/05/20 1694

## 2020-10-07 ENCOUNTER — Encounter: Payer: Self-pay | Admitting: Medical

## 2020-10-09 ENCOUNTER — Other Ambulatory Visit: Payer: Self-pay

## 2020-10-09 ENCOUNTER — Encounter: Payer: Self-pay | Admitting: *Deleted

## 2020-10-09 ENCOUNTER — Ambulatory Visit: Payer: BC Managed Care – PPO | Admitting: Podiatry

## 2020-10-09 ENCOUNTER — Ambulatory Visit (INDEPENDENT_AMBULATORY_CARE_PROVIDER_SITE_OTHER): Payer: BC Managed Care – PPO | Admitting: Podiatry

## 2020-10-09 ENCOUNTER — Encounter: Payer: Self-pay | Admitting: Podiatry

## 2020-10-09 DIAGNOSIS — L600 Ingrowing nail: Secondary | ICD-10-CM

## 2020-10-09 DIAGNOSIS — L03031 Cellulitis of right toe: Secondary | ICD-10-CM | POA: Diagnosis not present

## 2020-10-09 DIAGNOSIS — E1165 Type 2 diabetes mellitus with hyperglycemia: Secondary | ICD-10-CM | POA: Diagnosis not present

## 2020-10-09 MED ORDER — DOXYCYCLINE HYCLATE 100 MG PO TABS: 100.0000 mg | ORAL_TABLET | Freq: Two times a day (BID) | ORAL | 0 refills | Status: DC

## 2020-10-09 MED ORDER — DOXYCYCLINE HYCLATE 100 MG PO TABS: 100.0000 mg | ORAL_TABLET | Freq: Two times a day (BID) | ORAL | 0 refills | Status: AC

## 2020-10-09 NOTE — Progress Notes (Signed)
Subjective:  Patient ID: Lawrence Maynard, male    DOB: June 11, 1965,  MRN: [id number]  Chief Complaint  Patient presents with   Ingrown Toenail    Right hallux ingrown and acute gout in right toe     55 y.o. male presents with the above complaint.  Patient presents with complaint of right hallux ingrown medial border.  Patient states the pain first as there is a purulent drainage.  There is redness associate with it.  He states that he was placed on antibiotics by his primary care physician Keflex.  He states that he is a diabetic with last A1c of 9.1.  Has progressive gotten worse.  He would like to have the ingrown removed.  He tried to remove it himself which led to an infection.   Review of Systems: Negative except as noted in the HPI. Denies N/V/F/Ch.  Past Medical History:  Diagnosis Date   Diabetes mellitus without complication (HCC)    Gout    Hypertension    Obesity    PAD (peripheral artery disease) (HCC)     Current Outpatient Medications:    doxycycline (VIBRA-TABS) 100 MG tablet, Take 1 tablet (100 mg total) by mouth 2 (two) times daily for 10 days., Disp: 20 tablet, Rfl: 0   allopurinol (ZYLOPRIM) 300 MG tablet, Take 1 tablet (300 mg total) by mouth daily., Disp: 90 tablet, Rfl: 3   aspirin 81 MG chewable tablet, Chew 1 tablet (81 mg total) by mouth daily., Disp: 90 tablet, Rfl: 3   carvedilol (COREG) 6.25 MG tablet, Take 6.25 mg by mouth 2 (two) times daily with a meal., Disp: , Rfl:    clopidogrel (PLAVIX) 75 MG tablet, Take 1 tablet (75 mg total) by mouth daily., Disp: 90 tablet, Rfl: 1   Empagliflozin-metFORMIN HCl (SYNJARDY) 12.07-998 MG TABS, Take 1 tablet by mouth 2 (two) times daily., Disp: 180 tablet, Rfl: 1   Evolocumab (REPATHA) 140 MG/ML SOSY, Inject 140 mg into the skin every 14 (fourteen) days., Disp: 2.1 mL, Rfl: 11   insulin glargine, 2 Unit Dial, (TOUJEO MAX SOLOSTAR) 300 UNIT/ML Solostar Pen, Inject 80 Units into the skin daily., Disp: 15 mL, Rfl: 3   Insulin  Pen Needle (BD PEN NEEDLE NANO U/F) 32G X 4 MM MISC, 1 each by Does not apply route at bedtime., Disp: 100 each, Rfl: 11   lisinopril (ZESTRIL) 40 MG tablet, Take 1 tablet (40 mg total) by mouth daily., Disp: 90 tablet, Rfl: 1   predniSONE (DELTASONE) 20 MG tablet, 2 tabs po daily x 3 days, Disp: 6 tablet, Rfl: 0   pregabalin (LYRICA) 50 MG capsule, Take 1 capsule (50 mg total) by mouth 2 (two) times daily., Disp: 180 capsule, Rfl: 0   rosuvastatin (CRESTOR) 20 MG tablet, Take 1 tablet (20 mg total) by mouth at bedtime., Disp: 90 tablet, Rfl: 1   Semaglutide, 1 MG/DOSE, (OZEMPIC, 1 MG/DOSE,) 4 MG/3ML SOPN, Inject 1 mg into the skin once a week., Disp: 3 mL, Rfl: 5   sildenafil (VIAGRA) 100 MG tablet, Take 1 tablet (100 mg total) by mouth as needed for erectile dysfunction., Disp: 20 tablet, Rfl: 1   traMADol (ULTRAM) 50 MG tablet, Take 1 tablet (50 mg total) by mouth 3 (three) times daily., Disp: 90 tablet, Rfl: 2  Current Facility-Administered Medications:    triamcinolone acetonide (KENALOG) 10 MG/ML injection 10 mg, 10 mg, Other, Once, Regal, Kirstie Peri, DPM  Social History   Tobacco Use  Smoking Status Never  Smokeless Tobacco  Never    No Known Allergies Objective:  There were no vitals filed for this visit. There is no height or weight on file to calculate BMI. Constitutional Well developed. Well nourished.  Vascular Dorsalis pedis pulses faintly palpable bilaterally. Posterior tibial pulses faintly palpable bilaterally. Capillary refill normal to all digits.  No cyanosis or clubbing noted. Pedal hair growth normal.  Neurologic Normal speech. Oriented to person, place, and time. Epicritic sensation to light touch grossly present bilaterally.  Dermatologic Painful ingrowing nail at medial nail borders of the hallux nail right.  Erythema noted with redness No other open wounds. No skin lesions.  Orthopedic: Normal joint ROM without pain or crepitus bilaterally. No visible  deformities. No bony tenderness.   Radiographs: None Assessment:   1. Ingrown toenail of right foot   2. Paronychia of great toe of right foot   3. Uncontrolled type 2 diabetes mellitus with hyperglycemia (HCC)    Plan:  Patient was evaluated and treated and all questions answered.  Ingrown Nail, right -Patient elects to proceed with minor surgery to remove ingrown toenail removal today. Consent reviewed and signed by patient. -Ingrown nail excised. See procedure note. -I encouraged Betadine wet-to-dry dressing written instructions provided and reviewed. -Patient to follow up in 2 weeks for nail check. -Doxycycline was dispensed for skin and soft tissue prophylaxis.  Given that he is a diabetic with uncontrolled A1c he is a high risk of infection however given that he is already currently infected at this time the benefit of not doing an ingrown procedure does not outweigh the benefit of removing the ingrown.  Patient states understand like to proceed despite the risks  Procedure: Excision of Ingrown Toenail Location: Right 1st toe medial nail borders. Anesthesia: Lidocaine 1% plain; 1.5 mL and Marcaine 0.5% plain; 1.5 mL, digital block. Skin Prep: Betadine. Dressing: Silvadene; telfa; dry, sterile, compression dressing. Technique: Following skin prep, the toe was exsanguinated and a tourniquet was secured at the base of the toe. The affected nail border was freed, split with a nail splitter, and excised. Chemical matrixectomy was then performed with phenol and irrigated out with alcohol. The tourniquet was then removed and sterile dressing applied. Disposition: Patient tolerated procedure well. Patient to return in 2 weeks for follow-up.   No follow-ups on file.

## 2020-10-15 ENCOUNTER — Telehealth: Payer: Self-pay | Admitting: *Deleted

## 2020-10-15 MED ORDER — IBUPROFEN 800 MG PO TABS: 800.0000 mg | ORAL_TABLET | Freq: Four times a day (QID) | ORAL | 1 refills | Status: AC | PRN

## 2020-10-15 NOTE — Addendum Note (Signed)
Addended by: Nicholes Rough on: 10/15/2020 04:38 PM   Modules accepted: Orders

## 2020-10-15 NOTE — Telephone Encounter (Signed)
"  I've still been having pain in my toes since I left there.  I don't know if it's the gout or what.  I'm in pain, I need something for the pain."  What pharmacy do you use?  "Send it to [facility], it doesn't matter which one."  I'll tell them to send it to [facility].

## 2020-10-16 ENCOUNTER — Emergency Department (HOSPITAL_COMMUNITY)
Admission: EM | Admit: 2020-10-16 | Discharge: 2020-10-17 | Disposition: A | Discharge status: Home / Self Care | Payer: BC Managed Care – PPO | Attending: Emergency Medicine | Admitting: Emergency Medicine

## 2020-10-16 ENCOUNTER — Other Ambulatory Visit: Payer: Self-pay

## 2020-10-16 ENCOUNTER — Encounter (HOSPITAL_COMMUNITY): Payer: Self-pay

## 2020-10-16 DIAGNOSIS — M79675 Pain in left toe(s): Secondary | ICD-10-CM | POA: Insufficient documentation

## 2020-10-16 DIAGNOSIS — Z5321 Procedure and treatment not carried out due to patient leaving prior to being seen by health care provider: Secondary | ICD-10-CM | POA: Insufficient documentation

## 2020-10-16 MED ORDER — HYDROCODONE-ACETAMINOPHEN 5-325 MG PO TABS
1.0000 | ORAL_TABLET | Freq: Once | ORAL | Status: AC
  Administered 2020-10-16: 1 via ORAL
  Filled 2020-10-16: qty 1

## 2020-10-16 MED ORDER — PREDNISONE 20 MG PO TABS
60.0000 mg | ORAL_TABLET | Freq: Once | ORAL | Status: AC
  Administered 2020-10-16: 60 mg via ORAL
  Filled 2020-10-16: qty 3

## 2020-10-16 NOTE — ED Provider Notes (Signed)
Emergency Medicine Provider Triage Evaluation Note  Lawrence Maynard , a 55 y.o. male  was evaluated in triage.  Pt complains of pain to the left great toe. Had ingrown toenail removed by Dr. Allena Katz of Podiatry on 10/09/20. Has been having pain since this time which he attributes to gout. Also on doxycycline given concern for associated infection, though patient reports swelling is improving with abx. Denies fevers, drainage. Is requesting medication for pain as OTC analgesics have not been helping. Doesn't have follow up until 10/30/20.  Review of Systems  Positive: L great toe pain Negative: Fever, wound drainage  Physical Exam  BP (!) 153/90 (BP Location: Left Arm)   Pulse 90   Temp 98.5 F (36.9 C) (Oral)   Resp 18   Ht 5\' 7"  (1.702 m)   Wt 95 kg   SpO2 97%   BMI 32.80 kg/m  Gen:   Awake, no distress   Resp:  Normal effort  MSK:   Moves extremities without difficulty  Other:  Pallor to skin around site of toenail removal noted upon removal of bandage. Left open to air. No purulence, frank abscess. Mild erythema and swelling of the left 1st MTP joint. TTP to the L great toe.  Medical Decision Making  Medically screening exam initiated at 10:25 PM.  Appropriate orders placed.  Alexi Geibel was informed that the remainder of the evaluation will be completed by another provider, this initial triage assessment does not replace that evaluation, and the importance of remaining in the ED until their evaluation is complete.  L great toe pain   Loni Beckwith, PA-C 10/16/20 2228    2229, MD 10/18/20 2019

## 2020-10-16 NOTE — ED Triage Notes (Signed)
Pt c/o left great toe pain. Pt states he had an ingrown toe nail cut out.

## 2020-10-17 NOTE — ED Notes (Signed)
Pt did not respond when called for vitals check X2 

## 2020-10-18 ENCOUNTER — Telehealth: Payer: Self-pay

## 2020-10-18 NOTE — Telephone Encounter (Signed)
P.A. response says paid claim, called pharmacy and went thru for $0.  They will order and be in on Monday.  Called and left message for pt

## 2020-10-18 NOTE — Telephone Encounter (Signed)
P.[facility]. REPATHA 

## 2020-10-19 ENCOUNTER — Other Ambulatory Visit: Payer: Self-pay | Admitting: Medical

## 2020-10-30 ENCOUNTER — Encounter: Payer: Self-pay | Admitting: Podiatry

## 2020-10-30 ENCOUNTER — Ambulatory Visit (INDEPENDENT_AMBULATORY_CARE_PROVIDER_SITE_OTHER): Payer: BC Managed Care – PPO | Admitting: Podiatry

## 2020-10-30 ENCOUNTER — Other Ambulatory Visit: Payer: Self-pay

## 2020-10-30 DIAGNOSIS — L600 Ingrowing nail: Secondary | ICD-10-CM | POA: Diagnosis not present

## 2020-10-30 DIAGNOSIS — E1165 Type 2 diabetes mellitus with hyperglycemia: Secondary | ICD-10-CM

## 2020-10-30 DIAGNOSIS — S91101A Unspecified open wound of right great toe without damage to nail, initial encounter: Secondary | ICD-10-CM

## 2020-10-30 NOTE — Progress Notes (Signed)
Subjective:  Patient ID: Lawrence Maynard, male    DOB: 10/18/1965,  MRN: [id number]  Chief Complaint  Patient presents with   Ingrown Toenail    Right hallux ingrown follow up     55 y.o. male presents with the above complaint.  Patient presents with follow-up of right hallux medial ingrown nail wound.  Patient states that is mildly painful.  He did was not compliant with doing Epson salt soaks.  He had an ingrown removed by me.  I did discuss with him prior to the removal that his A1c is high however given there is currently uninfected state I proceeded with removing the ingrown toenail with knowing the complication of developing wound given his A1c.  Patient would like to discuss further treatment options he denies any other acute complaints.   Review of Systems: Negative except as noted in the HPI. Denies N/V/F/Ch.  Past Medical History:  Diagnosis Date   Diabetes mellitus without complication (HCC)    Gout    Hypertension    Obesity    PAD (peripheral artery disease) (HCC)     Current Outpatient Medications:    allopurinol (ZYLOPRIM) 300 MG tablet, Take 1 tablet (300 mg total) by mouth daily., Disp: 90 tablet, Rfl: 3   aspirin 81 MG chewable tablet, Chew 1 tablet (81 mg total) by mouth daily., Disp: 90 tablet, Rfl: 3   carvedilol (COREG) 6.25 MG tablet, Take 6.25 mg by mouth 2 (two) times daily with a meal., Disp: , Rfl:    clopidogrel (PLAVIX) 75 MG tablet, Take 1 tablet (75 mg total) by mouth daily., Disp: 90 tablet, Rfl: 1   Empagliflozin-metFORMIN HCl (SYNJARDY) 12.07-998 MG TABS, Take 1 tablet by mouth 2 (two) times daily., Disp: 180 tablet, Rfl: 1   ibuprofen (ADVIL) 800 MG tablet, Take 1 tablet (800 mg total) by mouth every 6 (six) hours as needed., Disp: 60 tablet, Rfl: 1   insulin glargine, 2 Unit Dial, (TOUJEO MAX SOLOSTAR) 300 UNIT/ML Solostar Pen, Inject 80 Units into the skin daily., Disp: 15 mL, Rfl: 3   Insulin Pen Needle (BD PEN NEEDLE NANO U/F) 32G X 4 MM MISC, 1 each  by Does not apply route at bedtime., Disp: 100 each, Rfl: 11   lisinopril (ZESTRIL) 40 MG tablet, Take 1 tablet (40 mg total) by mouth daily., Disp: 90 tablet, Rfl: 1   predniSONE (DELTASONE) 20 MG tablet, 2 tabs po daily x 3 days, Disp: 6 tablet, Rfl: 0   pregabalin (LYRICA) 50 MG capsule, Take 1 capsule (50 mg total) by mouth 2 (two) times daily., Disp: 180 capsule, Rfl: 0   REPATHA 140 MG/ML SOSY, INJECT 140 MG SUBCUTANEOUSLY EVERY 14 DAYS, Disp: 2 mL, Rfl: 0   rosuvastatin (CRESTOR) 20 MG tablet, Take 1 tablet (20 mg total) by mouth at bedtime., Disp: 90 tablet, Rfl: 1   Semaglutide, 1 MG/DOSE, (OZEMPIC, 1 MG/DOSE,) 4 MG/3ML SOPN, Inject 1 mg into the skin once a week., Disp: 3 mL, Rfl: 5   sildenafil (VIAGRA) 100 MG tablet, Take 1 tablet (100 mg total) by mouth as needed for erectile dysfunction., Disp: 20 tablet, Rfl: 1   traMADol (ULTRAM) 50 MG tablet, Take 1 tablet (50 mg total) by mouth 3 (three) times daily., Disp: 90 tablet, Rfl: 2  Current Facility-Administered Medications:    triamcinolone acetonide (KENALOG) 10 MG/ML injection 10 mg, 10 mg, Other, Once, Regal, Kirstie Peri, DPM  Social History   Tobacco Use  Smoking Status Never  Smokeless Tobacco Never  No Known Allergies Objective:  There were no vitals filed for this visit. There is no height or weight on file to calculate BMI. Constitutional Well developed. Well nourished.  Vascular Dorsalis pedis pulses palpable bilaterally. Posterior tibial pulses palpable bilaterally. Capillary refill normal to all digits.  No cyanosis or clubbing noted. Pedal hair growth normal.  Neurologic Normal speech. Oriented to person, place, and time. Epicritic sensation to light touch grossly present bilaterally.  Dermatologic No further regrowing of ingrown nail noted.  Macerated skin tissue noted.  Mild epidermal lysis noted.  No redness no purulent drainage noted.  Orthopedic: Normal joint ROM without pain or crepitus bilaterally. No  visible deformities. No bony tenderness.   Radiographs: None Assessment:   1. Ingrown toenail of right foot   2. Uncontrolled type 2 diabetes mellitus with hyperglycemia (HCC)   3. Open wound of right great toe, initial encounter    Plan:  Patient was evaluated and treated and all questions answered.  Right hallux macerated skin/wound secondary to removal of ingrown -I explained the patient the etiology of ingrowing versus treatment options were discussed.  Given the nature of the wound in the setting of uncontrolled diabetes patient will benefit from aggressive local wound care.  I discussed with him to do Betadine wet-to-dry dressing changes every single day.  Also discussed with him to keep it nice and dry. -At this time we will hold off on antibiotics as he is not currently infected.  There is no malodor present. -I discussed open toed shoes to take the pressure off of the great toe site.  Patient states he will try his best. -He is a high risk of developing wound complication and losing the great toe if the wound regresses.  I discussed this with the patient in extensive detail he states understanding No follow-ups on file.

## 2020-11-13 ENCOUNTER — Other Ambulatory Visit: Payer: Self-pay

## 2020-11-13 ENCOUNTER — Encounter: Payer: Self-pay | Admitting: Podiatry

## 2020-11-13 ENCOUNTER — Ambulatory Visit (INDEPENDENT_AMBULATORY_CARE_PROVIDER_SITE_OTHER): Payer: BC Managed Care – PPO | Admitting: Podiatry

## 2020-11-13 DIAGNOSIS — Q666 Other congenital valgus deformities of feet: Secondary | ICD-10-CM

## 2020-11-13 DIAGNOSIS — S91101A Unspecified open wound of right great toe without damage to nail, initial encounter: Secondary | ICD-10-CM | POA: Diagnosis not present

## 2020-11-13 DIAGNOSIS — E1165 Type 2 diabetes mellitus with hyperglycemia: Secondary | ICD-10-CM

## 2020-11-13 NOTE — Progress Notes (Signed)
Subjective:  Patient ID: Lawrence Maynard, male    DOB: 04/18/1965,  MRN: [id number]  Chief Complaint  Patient presents with   Ingrown Toenail    Nail/wound check    55 y.o. male presents with the above complaint.  Patient presents with follow-up to right hallux medial wound status post ingrown avulsion.  He states that it is feeling a lot better his pain has gone down considerably.  He has been doing Betadine wet-to-dry dressing changes he denies any other acute complaints.   Review of Systems: Negative except as noted in the HPI. Denies N/V/F/Ch.  Past Medical History:  Diagnosis Date   Diabetes mellitus without complication (HCC)    Gout    Hypertension    Obesity    PAD (peripheral artery disease) (HCC)     Current Outpatient Medications:    allopurinol (ZYLOPRIM) 300 MG tablet, Take 1 tablet (300 mg total) by mouth daily., Disp: 90 tablet, Rfl: 3   aspirin 81 MG chewable tablet, Chew 1 tablet (81 mg total) by mouth daily., Disp: 90 tablet, Rfl: 3   carvedilol (COREG) 6.25 MG tablet, Take 6.25 mg by mouth 2 (two) times daily with a meal., Disp: , Rfl:    clopidogrel (PLAVIX) 75 MG tablet, Take 1 tablet (75 mg total) by mouth daily., Disp: 90 tablet, Rfl: 1   Empagliflozin-metFORMIN HCl (SYNJARDY) 12.07-998 MG TABS, Take 1 tablet by mouth 2 (two) times daily., Disp: 180 tablet, Rfl: 1   ibuprofen (ADVIL) 800 MG tablet, Take 1 tablet (800 mg total) by mouth every 6 (six) hours as needed., Disp: 60 tablet, Rfl: 1   insulin glargine, 2 Unit Dial, (TOUJEO MAX SOLOSTAR) 300 UNIT/ML Solostar Pen, Inject 80 Units into the skin daily., Disp: 15 mL, Rfl: 3   Insulin Pen Needle (BD PEN NEEDLE NANO U/F) 32G X 4 MM MISC, 1 each by Does not apply route at bedtime., Disp: 100 each, Rfl: 11   lisinopril (ZESTRIL) 40 MG tablet, Take 1 tablet (40 mg total) by mouth daily., Disp: 90 tablet, Rfl: 1   predniSONE (DELTASONE) 20 MG tablet, 2 tabs po daily x 3 days, Disp: 6 tablet, Rfl: 0   pregabalin  (LYRICA) 50 MG capsule, Take 1 capsule (50 mg total) by mouth 2 (two) times daily., Disp: 180 capsule, Rfl: 0   REPATHA 140 MG/ML SOSY, INJECT 140 MG SUBCUTANEOUSLY EVERY 14 DAYS, Disp: 2 mL, Rfl: 0   rosuvastatin (CRESTOR) 20 MG tablet, Take 1 tablet (20 mg total) by mouth at bedtime., Disp: 90 tablet, Rfl: 1   Semaglutide, 1 MG/DOSE, (OZEMPIC, 1 MG/DOSE,) 4 MG/3ML SOPN, Inject 1 mg into the skin once a week., Disp: 3 mL, Rfl: 5   sildenafil (VIAGRA) 100 MG tablet, Take 1 tablet (100 mg total) by mouth as needed for erectile dysfunction., Disp: 20 tablet, Rfl: 1   traMADol (ULTRAM) 50 MG tablet, Take 1 tablet (50 mg total) by mouth 3 (three) times daily., Disp: 90 tablet, Rfl: 2  Current Facility-Administered Medications:    triamcinolone acetonide (KENALOG) 10 MG/ML injection 10 mg, 10 mg, Other, Once, Regal, Kirstie Peri, DPM  Social History   Tobacco Use  Smoking Status Never  Smokeless Tobacco Never    No Known Allergies Objective:  There were no vitals filed for this visit. There is no height or weight on file to calculate BMI. Constitutional Well developed. Well nourished.  Vascular Dorsalis pedis pulses palpable bilaterally. Posterior tibial pulses palpable bilaterally. Capillary refill normal to all digits.  No cyanosis or clubbing noted. Pedal hair growth normal.  Neurologic Normal speech. Oriented to person, place, and time. Epicritic sensation to light touch grossly present bilaterally.  Dermatologic No further regrowing of ingrown nail noted.  Macerated skin tissue noted.  Mild epidermal lysis noted.  No redness no purulent drainage noted.    Orthopedic: Gait examination shows calcaneovalgus to many toe signs unable to recruit the arch with dorsiflexion of the hallux unable to perform single and double heel raise.   Radiographs: None Assessment:   1. Uncontrolled type 2 diabetes mellitus with hyperglycemia (HCC)   2. Open wound of right great toe, initial encounter    3. Pes planovalgus     Plan:  Patient was evaluated and treated and all questions answered.  Right hallux macerated skin/wound secondary to removal of ingrown -I explained the patient the etiology of ingrowing versus treatment options were discussed.  Given the nature of the wound in the setting of uncontrolled diabetes patient will benefit from aggressive local wound care.  I discussed with him to continue Betadine wet-to-dry dressing changes every single day.  Also discussed with him to keep it nice and dry. -At this time we will hold off on antibiotics as he is not currently infected.  There is no malodor present. -I discussed open toed shoes to take the pressure off of the great toe site.  Patient states he will try his best. -He is a high risk of developing wound complication and losing the great toe if the wound regresses.  I discussed this with the patient in extensive detail he states understanding  Pes planovalgus -I explained to the patient the etiology of pes planovalgus and various treatment options were discussed.  Given that he has collapsing arch with calcaneovalgus to many toe signs I believe would benefit from custom-made orthotics to help control the hindfoot motion support the arch of the foot and may be even prevent pinch callus formation.  Patient agrees with plan like to proceed we will obtain orthotics -He was casted for orthotics No follow-ups on file.

## 2020-11-17 ENCOUNTER — Telehealth: Payer: Self-pay | Admitting: Podiatry

## 2020-11-17 MED ORDER — OXYCODONE-ACETAMINOPHEN 5-325 MG PO TABS: 1.0000 | ORAL_TABLET | ORAL | 0 refills | Status: DC | PRN

## 2020-11-17 NOTE — Telephone Encounter (Signed)
Pt. Call and ask for more pain medication he is still in pain and is out please give him a call at [phone]

## 2020-11-17 NOTE — Telephone Encounter (Signed)
Patient called back asking if someone resent his medication to the correct pharmacy. Pt states he is in pain.

## 2020-11-17 NOTE — Addendum Note (Signed)
Addended by: Nicholes Rough on: 11/17/2020 11:49 AM   Modules accepted: Orders

## 2020-11-17 NOTE — Telephone Encounter (Signed)
Patient called stating his RX refill was called into [place] and he is away. [facility] [place] [phone]

## 2020-11-18 NOTE — Telephone Encounter (Signed)
It looks like Dr. Allena Katz sent a prescription yesterday, 11/17/2020 of Percocet 5/325 mg #30.  Thanks, Dr. Logan Bores

## 2020-11-25 ENCOUNTER — Ambulatory Visit (INDEPENDENT_AMBULATORY_CARE_PROVIDER_SITE_OTHER): Payer: BC Managed Care – PPO | Admitting: Cardiovascular Disease

## 2020-11-25 ENCOUNTER — Other Ambulatory Visit: Payer: Self-pay

## 2020-11-25 ENCOUNTER — Telehealth: Payer: Self-pay | Admitting: Podiatry

## 2020-11-25 ENCOUNTER — Encounter: Payer: Self-pay | Admitting: Cardiovascular Disease

## 2020-11-25 VITALS — BP 140/78 | HR 94 | Ht 67.0 in | Wt 208.6 lb

## 2020-11-25 DIAGNOSIS — E782 Mixed hyperlipidemia: Secondary | ICD-10-CM

## 2020-11-25 DIAGNOSIS — I10 Essential (primary) hypertension: Secondary | ICD-10-CM | POA: Diagnosis not present

## 2020-11-25 DIAGNOSIS — I70229 Atherosclerosis of native arteries of extremities with rest pain, unspecified extremity: Secondary | ICD-10-CM

## 2020-11-25 NOTE — Assessment & Plan Note (Signed)
History of essential hypertension a blood pressure measured today 140/78.  He is on carvedilol and lisinopril.

## 2020-11-25 NOTE — Progress Notes (Signed)
11/25/2020 Lawrence Maynard   Mar 16, 1966  [id number]  Primary Physician Tysinger, Kermit Balo, PA-C Primary Cardiologist: Runell Gess MD Nicholes Calamity, [place]  HPI:  Lawrence Maynard is a 55 y.o.    mild to moderately overweight separated African-American male father of 2 daughters, grandfather to 2 grandchildren referred by Dr. Dellia Nims for peripheral vascular valuation because of resting foot pain.  He works in [employer].  He recently relocated from [facility] to [place].    I last saw him in the office 05/23/2020.  He has had a stroke in the past and had a loop recorder implanted and explanted without demonstration of an arrhythmia.  He does have treated hypertension, diabetes and hyperlipidemia.  He denies chest pain.  He has had left foot pain and left calf claudication which is gotten worse over the last 6 months now to the point of rest pain.  Recent Dopplers performed today revealed noncompressible ABIs with tibial disease on the left.   I performed peripheral angiography, orbital atherectomy PTA and drug-eluting stenting of his occluded left below the knee popliteal artery on 05/05/2020.  This was done for critical limb ischemia resting left foot pain.  Unfortunately, 1 week later he had Doppler studies that revealed his stent to have occluded and he had his small surrounding hematoma although his claudication and foot pain have resolved.  Since I saw him 6 months ago he has done well.  His claudication resolved as did his left foot pain.  He does have gout in his left great toe and had a left great ingrown toenail removed by Dr. Allena Katz several weeks ago who is aware of his PAD.  Apparently he did have bleeding during the procedure.  The wound is slowly healing.   Current Meds  Medication Sig   allopurinol (ZYLOPRIM) 300 MG tablet Take 1 tablet (300 mg total) by mouth daily.   aspirin 81 MG chewable tablet Chew 1 tablet (81 mg total) by mouth daily.   carvedilol (COREG) 6.25  MG tablet Take 6.25 mg by mouth 2 (two) times daily with a meal.   clopidogrel (PLAVIX) 75 MG tablet Take 1 tablet (75 mg total) by mouth daily.   Empagliflozin-metFORMIN HCl (SYNJARDY) 12.07-998 MG TABS Take 1 tablet by mouth 2 (two) times daily.   ibuprofen (ADVIL) 800 MG tablet Take 1 tablet (800 mg total) by mouth every 6 (six) hours as needed.   insulin glargine, 2 Unit Dial, (TOUJEO MAX SOLOSTAR) 300 UNIT/ML Solostar Pen Inject 80 Units into the skin daily.   Insulin Pen Needle (BD PEN NEEDLE NANO U/F) 32G X 4 MM MISC 1 each by Does not apply route at bedtime.   lisinopril (ZESTRIL) 40 MG tablet Take 1 tablet (40 mg total) by mouth daily.   oxyCODONE-acetaminophen (PERCOCET) 5-325 MG tablet Take 1-2 tablets by mouth every 4 (four) hours as needed for severe pain.   predniSONE (DELTASONE) 20 MG tablet 2 tabs po daily x 3 days   pregabalin (LYRICA) 50 MG capsule Take 1 capsule (50 mg total) by mouth 2 (two) times daily.   REPATHA 140 MG/ML SOSY INJECT 140 MG SUBCUTANEOUSLY EVERY 14 DAYS   rosuvastatin (CRESTOR) 20 MG tablet Take 1 tablet (20 mg total) by mouth at bedtime.   Semaglutide, 1 MG/DOSE, (OZEMPIC, 1 MG/DOSE,) 4 MG/3ML SOPN Inject 1 mg into the skin once a week.   sildenafil (VIAGRA) 100 MG tablet Take 1 tablet (100 mg total) by mouth as needed for  erectile dysfunction.   traMADol (ULTRAM) 50 MG tablet Take 1 tablet (50 mg total) by mouth 3 (three) times daily.   Current Facility-Administered Medications for the 11/25/20 encounter (Office Visit) with Runell Gess, MD  Medication   triamcinolone acetonide (KENALOG) 10 MG/ML injection 10 mg     No Known Allergies  Social History   Socioeconomic History   Marital status: Single    Spouse name: Not on file   Number of children: Not on file   Years of education: Not on file   Highest education level: Not on file  Occupational History   Not on file  Tobacco Use   Smoking status: Never   Smokeless tobacco: Never   Substance and Sexual Activity   Alcohol use: Not Currently   Drug use: Not on file   Sexual activity: Not Currently  Other Topics Concern   Not on file  Social History Narrative   Not on file   Social Determinants of Health   Financial Resource Strain: Not on file  Food Insecurity: Not on file  Transportation Needs: Not on file  Physical Activity: Not on file  Stress: Not on file  Social Connections: Not on file  Intimate Partner Violence: Not on file     Review of Systems: General: negative for chills, fever, night sweats or weight changes.  Cardiovascular: negative for chest pain, dyspnea on exertion, edema, orthopnea, palpitations, paroxysmal nocturnal dyspnea or shortness of breath Dermatological: negative for rash Respiratory: negative for cough or wheezing Urologic: negative for hematuria Abdominal: negative for nausea, vomiting, diarrhea, bright red blood per rectum, melena, or hematemesis Neurologic: negative for visual changes, syncope, or dizziness All other systems reviewed and are otherwise negative except as noted above.    Blood pressure 140/78, pulse 94, height 5\' 7"  (1.702 m), weight 208 lb 9.6 oz (94.6 kg), SpO2 97 %.  General appearance: alert and no distress Neck: no adenopathy, no carotid bruit, no JVD, supple, symmetrical, trachea midline, and thyroid not enlarged, symmetric, no tenderness/mass/nodules Lungs: clear to auscultation bilaterally Heart: regular rate and rhythm, S1, S2 normal, no murmur, click, rub or gallop Extremities: extremities normal, atraumatic, no cyanosis or edema Pulses: Absent left pedal pulse Skin: Skin color, texture, turgor normal. No rashes or lesions or wound left great toe Neurologic: Grossly normal  EKG sinus rhythm at 94 with evidence of LVH with repolarization changes.  Personally reviewed this EKG.  ASSESSMENT AND PLAN:   Essential hypertension History of essential hypertension a blood pressure measured today  140/78.  He is on carvedilol and lisinopril.  Hyperlipidemia History of hyperlipidemia on Crestor and Repatha.  We will recheck a fasting lipid liver profile.  Critical lower limb ischemia (HCC) History of critical limb ischemia with rest left foot pain and left calf claudication status post left popliteal orbital atherectomy, PCI drug-eluting stenting down into the posterior tibial artery on 05/05/2020 for a CTO.  The anterior tibial was occluded.  Unfortunately, a week later his stent was found to have occluded although his pain had resolved.  He just had a ingrown toenail removed by Dr. 07/03/2020 several weeks ago which is slowly healing.  Apparently when the toenail was removed he did have bleeding from the wound.     Allena Katz MD FACP,FACC,FAHA, [facility] 11/25/2020 8:45 AM

## 2020-11-25 NOTE — Assessment & Plan Note (Signed)
History of hyperlipidemia on Crestor and Repatha.  We will recheck a fasting lipid liver profile.

## 2020-11-25 NOTE — Assessment & Plan Note (Signed)
History of critical limb ischemia with rest left foot pain and left calf claudication status post left popliteal orbital atherectomy, PCI drug-eluting stenting down into the posterior tibial artery on 05/05/2020 for a CTO.  The anterior tibial was occluded.  Unfortunately, a week later his stent was found to have occluded although his pain had resolved.  He just had a ingrown toenail removed by Dr. Allena Katz several weeks ago which is slowly healing.  Apparently when the toenail was removed he did have bleeding from the wound.

## 2020-11-25 NOTE — Patient Instructions (Signed)
Medication Instructions:  The current medical regimen is effective;  continue present plan and medications.  *If you need a refill on your cardiac medications before your next appointment, please call your pharmacy*   Lab Work: LIPID/LIVER (come back fasting, nothing to eat or drink)   If you have labs (blood work) drawn today and your tests are completely normal, you will receive your results only by: MyChart Message (if you have MyChart) OR A paper copy in the mail If you have any lab test that is abnormal or we need to change your treatment, we will call you to review the results.   Follow-Up: At [facility], you and your health needs are our priority.  As part of our continuing mission to provide you with exceptional heart care, we have created designated Provider Care Teams.  These Care Teams include your primary Cardiologist (physician) and Advanced Practice Providers (APPs -  Physician Assistants and Nurse Practitioners) who all work together to provide you with the care you need, when you need it.  We recommend signing up for the patient portal called "MyChart".  Sign up information is provided on this After Visit Summary.  MyChart is used to connect with patients for Virtual Visits (Telemedicine).  Patients are able to view lab/test results, encounter notes, upcoming appointments, etc.  Non-urgent messages can be sent to your provider as well.   To learn more about what you can do with MyChart, go to [email or web address].    Your next appointment:   3 month(s)  The format for your next appointment:   In Person  Provider:   Nanetta Batty, MD

## 2020-11-25 NOTE — Telephone Encounter (Signed)
Patients fiance called stating pt is in a lot oof pain and wants to know if you can give him something or is there something over the counter he can take. Patient is working in [place] so he would need it called in to a pharmacy there. Patients number is [phone].

## 2020-11-29 ENCOUNTER — Other Ambulatory Visit: Payer: Self-pay

## 2020-11-29 ENCOUNTER — Emergency Department (HOSPITAL_COMMUNITY)
Admission: EM | Admit: 2020-11-29 | Discharge: 2020-11-30 | Disposition: A | Discharge status: Home / Self Care | Payer: BC Managed Care – PPO | Attending: Emergency Medicine | Admitting: Emergency Medicine

## 2020-11-29 ENCOUNTER — Encounter (HOSPITAL_COMMUNITY): Payer: Self-pay | Admitting: Emergency Medicine

## 2020-11-29 ENCOUNTER — Emergency Department (HOSPITAL_COMMUNITY): Payer: BC Managed Care – PPO

## 2020-11-29 DIAGNOSIS — I10 Essential (primary) hypertension: Secondary | ICD-10-CM | POA: Insufficient documentation

## 2020-11-29 DIAGNOSIS — E119 Type 2 diabetes mellitus without complications: Secondary | ICD-10-CM | POA: Insufficient documentation

## 2020-11-29 DIAGNOSIS — L089 Local infection of the skin and subcutaneous tissue, unspecified: Secondary | ICD-10-CM | POA: Diagnosis not present

## 2020-11-29 DIAGNOSIS — R110 Nausea: Secondary | ICD-10-CM | POA: Diagnosis not present

## 2020-11-29 DIAGNOSIS — Z23 Encounter for immunization: Secondary | ICD-10-CM | POA: Diagnosis not present

## 2020-11-29 DIAGNOSIS — M79675 Pain in left toe(s): Secondary | ICD-10-CM | POA: Diagnosis present

## 2020-11-29 LAB — CBC WITH DIFFERENTIAL/PLATELET
Abs Immature Granulocytes: 0.1 10*3/uL — ABNORMAL HIGH (ref 0.00–0.07)
Basophils Absolute: 0 10*3/uL (ref 0.0–0.1)
Basophils Relative: 0 %
Eosinophils Absolute: 0.1 10*3/uL (ref 0.0–0.5)
Eosinophils Relative: 1 %
HCT: 45 % (ref 39.0–52.0)
Hemoglobin: 14 g/dL (ref 13.0–17.0)
Immature Granulocytes: 1 %
Lymphocytes Relative: 27 %
Lymphs Abs: 2.5 10*3/uL (ref 0.7–4.0)
MCH: 27 pg (ref 26.0–34.0)
MCHC: 31.1 g/dL (ref 30.0–36.0)
MCV: 86.7 fL (ref 80.0–100.0)
Monocytes Absolute: 0.7 10*3/uL (ref 0.1–1.0)
Monocytes Relative: 7 %
Neutro Abs: 6 10*3/uL (ref 1.7–7.7)
Neutrophils Relative %: 64 %
Platelets: 241 10*3/uL (ref 150–400)
RBC: 5.19 MIL/uL (ref 4.22–5.81)
RDW: 13.3 % (ref 11.5–15.5)
WBC: 9.3 10*3/uL (ref 4.0–10.5)
nRBC: 0 % (ref 0.0–0.2)

## 2020-11-29 LAB — BASIC METABOLIC PANEL
Anion gap: 9 (ref 5–15)
BUN: 25 mg/dL — ABNORMAL HIGH (ref 6–20)
CO2: 25 mmol/L (ref 22–32)
Calcium: 8.9 mg/dL (ref 8.9–10.3)
Chloride: 102 mmol/L (ref 98–111)
Creatinine, Ser: 1.04 mg/dL (ref 0.61–1.24)
GFR, Estimated: >60 mL/min (ref >60)
Glucose, Bld: 289 mg/dL — ABNORMAL HIGH (ref 70–99)
Potassium: 4.7 mmol/L (ref 3.5–5.1)
Sodium: 136 mmol/L (ref 135–145)

## 2020-11-29 LAB — LACTIC ACID, PLASMA: Lactic Acid, Venous: 2.3 mmol/L (ref 0.5–1.9)

## 2020-11-29 MED ORDER — TETANUS-DIPHTH-ACELL PERTUSSIS 5-2.5-18.5 LF-MCG/0.5 IM SUSY
0.5000 mL | PREFILLED_SYRINGE | Freq: Once | INTRAMUSCULAR | Status: AC
  Administered 2020-11-30: 0.5 mL via INTRAMUSCULAR
  Filled 2020-11-29: qty 0.5

## 2020-11-29 MED ORDER — OXYCODONE-ACETAMINOPHEN 5-325 MG PO TABS
1.0000 | ORAL_TABLET | Freq: Once | ORAL | Status: AC
Start: 2020-11-29 — End: 2020-11-29
  Administered 2020-11-29: 1 via ORAL
  Filled 2020-11-29: qty 1

## 2020-11-29 NOTE — ED Notes (Signed)
Patient transported to X-ray 

## 2020-11-29 NOTE — ED Triage Notes (Signed)
Pt c/o left big toe pain, appears swollen and discolored.  Had ingrown toe nail removed three weeks ago.

## 2020-11-29 NOTE — ED Provider Notes (Signed)
Emergency Medicine Provider Triage Evaluation Note  Lawrence Maynard , a 55 y.o. male  was evaluated in triage.  Pt complains of L great toe pain.  Review of Systems  Positive: Toe pain, throbbing Negative: Fever, numbness  Physical Exam  There were no vitals taken for this visit. Gen:   Awake, no distress   Resp:  Normal effort  MSK:   Moves extremities without difficulty  Other:  L great KAJ:[id number] with palpable abscess to pad of toe, TTP  Medical Decision Making  Medically screening exam initiated at 10:14 PM.  Appropriate orders placed.  Dossie Swor was informed that the remainder of the evaluation will be completed by another provider, this initial triage assessment does not replace that evaluation, and the importance of remaining in the ED until their evaluation is complete.  Had ingrown toenail of L great toe that was treated by [facility] 3 weeks ago.  Progressive pain swelling and drainage since.  On doxy.  Concerns for abscess and potential osteo.   Fayrene Helper, PA-C 11/29/20 2216    Derwood Kaplan, MD 11/30/20 [phone]

## 2020-11-30 ENCOUNTER — Emergency Department (HOSPITAL_COMMUNITY): Payer: BC Managed Care – PPO

## 2020-11-30 LAB — LACTIC ACID, PLASMA: Lactic Acid, Venous: 1.4 mmol/L (ref 0.5–1.9)

## 2020-11-30 MED ORDER — OXYCODONE HCL 5 MG PO TABS: 5.0000 mg | ORAL_TABLET | ORAL | 0 refills | Status: DC | PRN

## 2020-11-30 MED ORDER — SODIUM CHLORIDE 0.9 % IV BOLUS
500.0000 mL | Freq: Once | INTRAVENOUS | Status: AC
  Administered 2020-11-30: 500 mL via INTRAVENOUS

## 2020-11-30 MED ORDER — ONDANSETRON HCL 4 MG/2ML IJ SOLN
4.0000 mg | Freq: Once | INTRAMUSCULAR | Status: AC
  Administered 2020-11-30: 4 mg via INTRAVENOUS
  Filled 2020-11-30: qty 2

## 2020-11-30 MED ORDER — AMOXICILLIN-POT CLAVULANATE 875-125 MG PO TABS: 1.0000 | ORAL_TABLET | Freq: Two times a day (BID) | ORAL | 0 refills | Status: DC

## 2020-11-30 MED ORDER — IOHEXOL 350 MG/ML SOLN
75.0000 mL | Freq: Once | INTRAVENOUS | Status: AC | PRN
  Administered 2020-11-30: 75 mL via INTRAVENOUS

## 2020-11-30 NOTE — ED Notes (Signed)
Pt care taken, has a rt large toe infection, had an ingrown toenail removed and now is infected

## 2020-11-30 NOTE — Discharge Instructions (Addendum)
You were evaluated in the Emergency Department and after careful evaluation, we did not find any emergent condition requiring admission or further testing in the hospital.  Your exam/testing today is overall reassuring.  CT scan with no evidence of bone infection.  Recommend switching your antibiotics.  Stop taking the doxycycline and start taking the Augmentin prescribed.  Follow-up closely with your podiatrist.  Recommend Tylenol 1000 mg every 4-6 hours and/or Motrin 600 mg every 4-6 hours for pain.  You can use the oxycodone medication for more significant pain.  Please return to the Emergency Department if you experience any worsening of your condition.   Thank you for allowing [place] to be a part of your care.

## 2020-11-30 NOTE — ED Provider Notes (Signed)
[facility] Emergency Department Provider Note MRN:  [id number]  Arrival date & time: 11/30/20     Chief Complaint   Toe Pain   History of Present Illness   Lawrence Maynard is a 55 y.o. year-old male with a history of diabetes, PAD presenting to the ED with chief complaint of toe pain.  Location: Left big toe Duration: 2 or 3-week Onset: Gradual Timing: Constant Description: Ache Severity: Moderate to severe Exacerbating/Alleviating Factors: Worse with motion or palpation Associated Symptoms: Nausea Pertinent Negatives: Denies fever, no recent trauma  Additional History: Had an ingrown toenail removed 3 weeks ago by podiatry  Review of Systems  A complete 10 system review of systems was obtained and all systems are negative except as noted in the HPI and PMH.   Patient's Health History    Past Medical History:  Diagnosis Date   Diabetes mellitus without complication (HCC)    Gout    Hypertension    Obesity    PAD (peripheral artery disease) (HCC)     Past Surgical History:  Procedure Laterality Date   ABDOMINAL AORTOGRAM W/LOWER EXTREMITY Left 05/05/2020   Procedure: ABDOMINAL AORTOGRAM W/LOWER EXTREMITY;  Surgeon: Runell Gess, MD;  [facility];  Service: Cardiovascular;  Laterality: Left;   PERIPHERAL VASCULAR ATHERECTOMY Left 05/05/2020   Procedure: PERIPHERAL VASCULAR ATHERECTOMY;  Surgeon: Runell Gess, MD;  Location: [facility] INVASIVE CV LAB;  Service: Cardiovascular;  Laterality: Left;  pt trunk   PERIPHERAL VASCULAR INTERVENTION Left 05/05/2020   Procedure: PERIPHERAL VASCULAR INTERVENTION;  Surgeon: Runell Gess, MD;  [facility] LAB;  Service: Cardiovascular;  Laterality: Left;  pt trunk    Family History  Problem Relation Age of Onset   Diabetes Mother    Hypertension Mother    Gout Mother    Diabetes Father    Hypertension Father    Gout Father     Social History   Socioeconomic History    Marital status: Single    Spouse name: Not on file   Number of children: Not on file   Years of education: Not on file   Highest education level: Not on file  Occupational History   Not on file  Tobacco Use   Smoking status: Never   Smokeless tobacco: Never  Substance and Sexual Activity   Alcohol use: Not Currently   Drug use: Not on file   Sexual activity: Not Currently  Other Topics Concern   Not on file  Social History Narrative   Not on file   Social Determinants of Health   Financial Resource Strain: Not on file  Food Insecurity: Not on file  Transportation Needs: Not on file  Physical Activity: Not on file  Stress: Not on file  Social Connections: Not on file  Intimate Partner Violence: Not on file     Physical Exam   Vitals:   11/30/20 0200 11/30/20 0323  BP:  (!) 146/91  Pulse: 76 74  Resp: 18 18  Temp:    SpO2:  94%    CONSTITUTIONAL: Well-appearing, NAD NEURO:  Alert and oriented x 3, no focal deficits EYES:  eyes equal and reactive ENT/NECK:  no LAD, no JVD CARDIO: Regular rate, well-perfused, normal S1 and S2 PULM:  CTAB no wheezing or rhonchi GI/GU:  normal bowel sounds, non-distended, non-tender MSK/SPINE:  No gross deformities, no edema SKIN: See picture below PSYCH:  Appropriate speech and behavior Media Information Document Information  Photos  11/30/2020 01:22  Attached To:  Hospital Encounter on 11/29/20   Source Information  Jaedyn Marrufo, Elmer Sow, MD  [facility]   *Additional and/or pertinent findings included in MDM below  Diagnostic and Interventional Summary    EKG Interpretation  Date/Time:    Ventricular Rate:    PR Interval:    QRS Duration:   QT Interval:    QTC Calculation:   R Axis:     Text Interpretation:         Labs Reviewed  LACTIC ACID, PLASMA - Abnormal; Notable for the following components:      Result Value   Lactic Acid, Venous 2.3 (*)    All other components within normal limits  BASIC  METABOLIC PANEL - Abnormal; Notable for the following components:   Glucose, Bld 289 (*)    BUN 25 (*)    All other components within normal limits  CBC WITH DIFFERENTIAL/PLATELET - Abnormal; Notable for the following components:   Abs Immature Granulocytes 0.10 (*)    All other components within normal limits  LACTIC ACID, PLASMA    CT FOOT LEFT W CONTRAST  Final Result    DG Toe Great Left  Final Result      Medications  oxyCODONE-acetaminophen (PERCOCET/ROXICET) 5-325 MG per tablet 1 tablet (1 tablet Oral Given 11/29/20 2220)  Tdap (BOOSTRIX) injection 0.5 mL (0.5 mLs Intramuscular Given 11/30/20 0148)  sodium chloride 0.9 % bolus 500 mL (0 mLs Intravenous Stopped 11/30/20 0327)  ondansetron (ZOFRAN) injection 4 mg (4 mg Intravenous Given 11/30/20 0327)  iohexol (OMNIPAQUE) 350 MG/ML injection 75 mL (75 mLs Intravenous Contrast Given 11/30/20 3007)     Procedures  /  Critical Care Procedures  ED Course and Medical Decision Making  I have reviewed the triage vital signs, the nursing notes, and pertinent available records from the EMR.  Listed above are laboratory and imaging tests that I personally ordered, reviewed, and interpreted and then considered in my medical decision making (see below for details).  Concern for cellulitis, possibly underlying osteomyelitis given the appearance.  No fever having some nausea.  X-ray is normal but given the high suspicion and patient's risk factors will obtain CT to evaluate for deeper infection.     CT is reassuring.  Suspect soft tissue infection.  Patient is already on doxycycline and has been on it for about 4 days.  We will try to switch oral antibiotics and have him follow closely with podiatry.  No systemic symptoms, no indication for admission.  Elmer Sow. Pilar Plate, MD [facility] Emergency Medicine [facility] [email or web address]  Final Clinical Impressions(s) / ED Diagnoses     ICD-10-CM   1. Toe infection  L08.9        ED Discharge Orders          Ordered    oxyCODONE (ROXICODONE) 5 MG immediate release tablet  Every 4 hours PRN        11/30/20 0422    amoxicillin-clavulanate (AUGMENTIN) 875-125 MG tablet  Every 12 hours        11/30/20 0425             Discharge Instructions Discussed with and Provided to Patient:     Discharge Instructions      You were evaluated in the Emergency Department and after careful evaluation, we did not find any emergent condition requiring admission or further testing in the hospital.  Your exam/testing today is overall reassuring.  CT scan with no evidence of  bone infection.  Recommend switching your antibiotics.  Stop taking the doxycycline and start taking the Augmentin prescribed.  Follow-up closely with your podiatrist.  Recommend Tylenol 1000 mg every 4-6 hours and/or Motrin 600 mg every 4-6 hours for pain.  You can use the oxycodone medication for more significant pain.  Please return to the Emergency Department if you experience any worsening of your condition.   Thank you for allowing [place] to be a part of your care.        Sabas Sous, MD 11/30/20 [phone]

## 2020-11-30 NOTE — ED Notes (Signed)
RN notified patient is asking for nausea medication. Emesis bag provided to patient.

## 2020-12-04 ENCOUNTER — Encounter (INDEPENDENT_AMBULATORY_CARE_PROVIDER_SITE_OTHER): Payer: Self-pay

## 2020-12-04 ENCOUNTER — Other Ambulatory Visit: Payer: Self-pay

## 2020-12-04 ENCOUNTER — Emergency Department: Payer: BC Managed Care – PPO

## 2020-12-04 ENCOUNTER — Inpatient Hospital Stay
Admission: EM | Admit: 2020-12-04 | Discharge: 2020-12-07 | DRG: 617 | Disposition: A | Discharge status: Home / Self Care | Payer: BC Managed Care – PPO | Attending: Internal Medicine | Admitting: Internal Medicine

## 2020-12-04 ENCOUNTER — Ambulatory Visit (INDEPENDENT_AMBULATORY_CARE_PROVIDER_SITE_OTHER): Payer: BC Managed Care – PPO | Admitting: Podiatry

## 2020-12-04 DIAGNOSIS — M009 Pyogenic arthritis, unspecified: Secondary | ICD-10-CM | POA: Diagnosis present

## 2020-12-04 DIAGNOSIS — Z79899 Other long term (current) drug therapy: Secondary | ICD-10-CM

## 2020-12-04 DIAGNOSIS — E1165 Type 2 diabetes mellitus with hyperglycemia: Secondary | ICD-10-CM | POA: Diagnosis present

## 2020-12-04 DIAGNOSIS — Z7984 Long term (current) use of oral hypoglycemic drugs: Secondary | ICD-10-CM | POA: Diagnosis not present

## 2020-12-04 DIAGNOSIS — Z9189 Other specified personal risk factors, not elsewhere classified: Secondary | ICD-10-CM | POA: Diagnosis not present

## 2020-12-04 DIAGNOSIS — E11621 Type 2 diabetes mellitus with foot ulcer: Secondary | ICD-10-CM | POA: Diagnosis present

## 2020-12-04 DIAGNOSIS — Z6834 Body mass index (BMI) 34.0-34.9, adult: Secondary | ICD-10-CM | POA: Diagnosis not present

## 2020-12-04 DIAGNOSIS — E1169 Type 2 diabetes mellitus with other specified complication: Secondary | ICD-10-CM | POA: Diagnosis present

## 2020-12-04 DIAGNOSIS — L97529 Non-pressure chronic ulcer of other part of left foot with unspecified severity: Secondary | ICD-10-CM | POA: Diagnosis present

## 2020-12-04 DIAGNOSIS — E1152 Type 2 diabetes mellitus with diabetic peripheral angiopathy with gangrene: Secondary | ICD-10-CM | POA: Diagnosis present

## 2020-12-04 DIAGNOSIS — Z8673 Personal history of transient ischemic attack (TIA), and cerebral infarction without residual deficits: Secondary | ICD-10-CM | POA: Diagnosis not present

## 2020-12-04 DIAGNOSIS — I739 Peripheral vascular disease, unspecified: Secondary | ICD-10-CM | POA: Diagnosis present

## 2020-12-04 DIAGNOSIS — Z794 Long term (current) use of insulin: Secondary | ICD-10-CM

## 2020-12-04 DIAGNOSIS — L089 Local infection of the skin and subcutaneous tissue, unspecified: Secondary | ICD-10-CM | POA: Diagnosis not present

## 2020-12-04 DIAGNOSIS — Z7952 Long term (current) use of systemic steroids: Secondary | ICD-10-CM

## 2020-12-04 DIAGNOSIS — E785 Hyperlipidemia, unspecified: Secondary | ICD-10-CM | POA: Diagnosis present

## 2020-12-04 DIAGNOSIS — Z833 Family history of diabetes mellitus: Secondary | ICD-10-CM

## 2020-12-04 DIAGNOSIS — L02612 Cutaneous abscess of left foot: Secondary | ICD-10-CM | POA: Diagnosis not present

## 2020-12-04 DIAGNOSIS — I10 Essential (primary) hypertension: Secondary | ICD-10-CM | POA: Diagnosis present

## 2020-12-04 DIAGNOSIS — E669 Obesity, unspecified: Secondary | ICD-10-CM | POA: Diagnosis present

## 2020-12-04 DIAGNOSIS — E11628 Type 2 diabetes mellitus with other skin complications: Secondary | ICD-10-CM | POA: Diagnosis not present

## 2020-12-04 DIAGNOSIS — Z20822 Contact with and (suspected) exposure to covid-19: Secondary | ICD-10-CM | POA: Diagnosis present

## 2020-12-04 DIAGNOSIS — Z7902 Long term (current) use of antithrombotics/antiplatelets: Secondary | ICD-10-CM | POA: Diagnosis not present

## 2020-12-04 DIAGNOSIS — E1151 Type 2 diabetes mellitus with diabetic peripheral angiopathy without gangrene: Secondary | ICD-10-CM | POA: Diagnosis not present

## 2020-12-04 DIAGNOSIS — Z09 Encounter for follow-up examination after completed treatment for conditions other than malignant neoplasm: Secondary | ICD-10-CM | POA: Diagnosis not present

## 2020-12-04 DIAGNOSIS — Z8249 Family history of ischemic heart disease and other diseases of the circulatory system: Secondary | ICD-10-CM | POA: Diagnosis not present

## 2020-12-04 DIAGNOSIS — M86671 Other chronic osteomyelitis, right ankle and foot: Secondary | ICD-10-CM | POA: Diagnosis not present

## 2020-12-04 DIAGNOSIS — M86172 Other acute osteomyelitis, left ankle and foot: Secondary | ICD-10-CM | POA: Diagnosis present

## 2020-12-04 DIAGNOSIS — Z7982 Long term (current) use of aspirin: Secondary | ICD-10-CM

## 2020-12-04 DIAGNOSIS — M109 Gout, unspecified: Secondary | ICD-10-CM | POA: Diagnosis present

## 2020-12-04 DIAGNOSIS — Z0000 Encounter for general adult medical examination without abnormal findings: Secondary | ICD-10-CM

## 2020-12-04 LAB — BASIC METABOLIC PANEL
Anion gap: 9 (ref 5–15)
BUN: 29 mg/dL — ABNORMAL HIGH (ref 6–20)
CO2: 26 mmol/L (ref 22–32)
Calcium: 8.9 mg/dL (ref 8.9–10.3)
Chloride: 98 mmol/L (ref 98–111)
Creatinine, Ser: 1.28 mg/dL — ABNORMAL HIGH (ref 0.61–1.24)
GFR, Estimated: >60 mL/min (ref >60)
Glucose, Bld: 230 mg/dL — ABNORMAL HIGH (ref 70–99)
Potassium: 5.1 mmol/L (ref 3.5–5.1)
Sodium: 133 mmol/L — ABNORMAL LOW (ref 135–145)

## 2020-12-04 LAB — CBC
HCT: 43 % (ref 39.0–52.0)
Hemoglobin: 13.8 g/dL (ref 13.0–17.0)
MCH: 27.7 pg (ref 26.0–34.0)
MCHC: 32.1 g/dL (ref 30.0–36.0)
MCV: 86.3 fL (ref 80.0–100.0)
Platelets: 209 10*3/uL (ref 150–400)
RBC: 4.98 MIL/uL (ref 4.22–5.81)
RDW: 13.6 % (ref 11.5–15.5)
WBC: 9.2 10*3/uL (ref 4.0–10.5)
nRBC: 0 % (ref 0.0–0.2)

## 2020-12-04 LAB — RESP PANEL BY RT-PCR (FLU A&B, COVID) ARPGX2
Influenza A by PCR: NEGATIVE
Influenza B by PCR: NEGATIVE
SARS Coronavirus 2 by RT PCR: NEGATIVE

## 2020-12-04 LAB — SEDIMENTATION RATE: Sed Rate: 9 mm/hr (ref 0–20)

## 2020-12-04 LAB — GLUCOSE, CAPILLARY: Glucose-Capillary: 199 mg/dL — ABNORMAL HIGH (ref 70–99)

## 2020-12-04 LAB — PROTIME-INR
INR: 0.9 (ref 0.8–1.2)
Prothrombin Time: 11.6 seconds (ref 11.4–15.2)

## 2020-12-04 LAB — C-REACTIVE PROTEIN: CRP: 1.5 mg/dL — ABNORMAL HIGH (ref <1.0)

## 2020-12-04 LAB — APTT: aPTT: 31 seconds (ref 24–36)

## 2020-12-04 MED ORDER — SODIUM CHLORIDE 0.9 % IV SOLN
2.0000 g | INTRAVENOUS | Status: DC
  Administered 2020-12-05 – 2020-12-07 (×3): 2 g via INTRAVENOUS
  Filled 2020-12-04: qty 20
  Filled 2020-12-04 (×2): qty 2

## 2020-12-04 MED ORDER — MORPHINE SULFATE (PF) 4 MG/ML IV SOLN
4.0000 mg | INTRAVENOUS | Status: DC | PRN
Start: 2020-12-04 — End: 2020-12-04
  Administered 2020-12-04 (×2): 4 mg via INTRAVENOUS
  Filled 2020-12-04 (×2): qty 1

## 2020-12-04 MED ORDER — ROSUVASTATIN CALCIUM 20 MG PO TABS
20.0000 mg | ORAL_TABLET | Freq: Every day | ORAL | Status: DC
  Administered 2020-12-04 – 2020-12-06 (×3): 20 mg via ORAL
  Filled 2020-12-04 (×4): qty 1

## 2020-12-04 MED ORDER — INSULIN ASPART 100 UNIT/ML IJ SOLN
0.0000 [IU] | Freq: Every day | INTRAMUSCULAR | Status: DC
  Administered 2020-12-06: 2 [IU] via SUBCUTANEOUS
  Filled 2020-12-04 (×2): qty 1

## 2020-12-04 MED ORDER — HYDRALAZINE HCL 20 MG/ML IJ SOLN: 5.0000 mg | INTRAMUSCULAR | Status: DC | PRN

## 2020-12-04 MED ORDER — VANCOMYCIN HCL IN DEXTROSE 1-5 GM/200ML-% IV SOLN
1000.0000 mg | Freq: Once | INTRAVENOUS | Status: AC
  Administered 2020-12-04: 1000 mg via INTRAVENOUS
  Filled 2020-12-04: qty 200

## 2020-12-04 MED ORDER — CARVEDILOL 6.25 MG PO TABS
6.2500 mg | ORAL_TABLET | Freq: Two times a day (BID) | ORAL | Status: DC
  Administered 2020-12-04 – 2020-12-07 (×5): 6.25 mg via ORAL
  Filled 2020-12-04 (×6): qty 1

## 2020-12-04 MED ORDER — SODIUM CHLORIDE 0.9 % IV SOLN
2.0000 g | Freq: Once | INTRAVENOUS | Status: AC
  Administered 2020-12-04: 2 g via INTRAVENOUS
  Filled 2020-12-04: qty 20

## 2020-12-04 MED ORDER — INSULIN GLARGINE-YFGN 100 UNIT/ML ~~LOC~~ SOLN
60.0000 [IU] | Freq: Every day | SUBCUTANEOUS | Status: DC
  Administered 2020-12-04: 21:00:00 60 [IU] via SUBCUTANEOUS
  Filled 2020-12-04 (×2): qty 0.6

## 2020-12-04 MED ORDER — ONDANSETRON HCL 4 MG/2ML IJ SOLN
4.0000 mg | Freq: Once | INTRAMUSCULAR | Status: AC
  Administered 2020-12-04: 4 mg via INTRAVENOUS
  Filled 2020-12-04: qty 2

## 2020-12-04 MED ORDER — ONDANSETRON HCL 4 MG/2ML IJ SOLN
4.0000 mg | Freq: Three times a day (TID) | INTRAMUSCULAR | Status: DC | PRN
  Administered 2020-12-05: 4 mg via INTRAVENOUS
  Filled 2020-12-04 (×2): qty 2

## 2020-12-04 MED ORDER — INSULIN ASPART 100 UNIT/ML IJ SOLN
0.0000 [IU] | Freq: Three times a day (TID) | INTRAMUSCULAR | Status: DC
  Administered 2020-12-05 (×2): 5 [IU] via SUBCUTANEOUS
  Administered 2020-12-05: 10:00:00 15 [IU] via SUBCUTANEOUS
  Administered 2020-12-06: 17:00:00 5 [IU] via SUBCUTANEOUS
  Administered 2020-12-07: 3 [IU] via SUBCUTANEOUS
  Filled 2020-12-04 (×4): qty 1

## 2020-12-04 MED ORDER — METRONIDAZOLE 500 MG/100ML IV SOLN
500.0000 mg | Freq: Once | INTRAVENOUS | Status: AC
  Administered 2020-12-04: 500 mg via INTRAVENOUS
  Filled 2020-12-04: qty 100

## 2020-12-04 MED ORDER — ACETAMINOPHEN 325 MG PO TABS
650.0000 mg | ORAL_TABLET | Freq: Four times a day (QID) | ORAL | Status: DC | PRN
  Administered 2020-12-04 – 2020-12-05 (×2): 650 mg via ORAL
  Filled 2020-12-04 (×2): qty 2

## 2020-12-04 MED ORDER — METRONIDAZOLE 500 MG/100ML IV SOLN
500.0000 mg | Freq: Two times a day (BID) | INTRAVENOUS | Status: DC
  Administered 2020-12-04 – 2020-12-07 (×7): 500 mg via INTRAVENOUS
  Filled 2020-12-04 (×7): qty 100

## 2020-12-04 MED ORDER — VANCOMYCIN HCL 1500 MG/300ML IV SOLN
1500.0000 mg | INTRAVENOUS | Status: DC
  Administered 2020-12-04: 1500 mg via INTRAVENOUS
  Filled 2020-12-04 (×3): qty 300

## 2020-12-04 MED ORDER — HEPARIN SODIUM (PORCINE) 5000 UNIT/ML IJ SOLN
5000.0000 [IU] | Freq: Three times a day (TID) | INTRAMUSCULAR | Status: DC
  Administered 2020-12-04 – 2020-12-07 (×7): 5000 [IU] via SUBCUTANEOUS
  Filled 2020-12-04 (×7): qty 1

## 2020-12-04 MED ORDER — MORPHINE SULFATE (PF) 2 MG/ML IV SOLN
2.0000 mg | INTRAVENOUS | Status: DC | PRN
  Administered 2020-12-04 – 2020-12-07 (×6): 2 mg via INTRAVENOUS
  Filled 2020-12-04 (×6): qty 1

## 2020-12-04 MED ORDER — OXYCODONE-ACETAMINOPHEN 5-325 MG PO TABS
1.0000 | ORAL_TABLET | ORAL | Status: DC | PRN
  Administered 2020-12-04 – 2020-12-05 (×2): 1 via ORAL
  Filled 2020-12-04 (×3): qty 1

## 2020-12-04 MED ORDER — ASPIRIN 81 MG PO CHEW
81.0000 mg | CHEWABLE_TABLET | Freq: Every day | ORAL | Status: DC
  Administered 2020-12-05 – 2020-12-07 (×2): 81 mg via ORAL
  Filled 2020-12-04 (×2): qty 1

## 2020-12-04 MED ORDER — LISINOPRIL 20 MG PO TABS
40.0000 mg | ORAL_TABLET | Freq: Every day | ORAL | Status: DC
  Administered 2020-12-05 – 2020-12-07 (×2): 40 mg via ORAL
  Filled 2020-12-04 (×3): qty 2

## 2020-12-04 MED ORDER — INSULIN ASPART 100 UNIT/ML IJ SOLN
6.0000 [IU] | Freq: Once | INTRAMUSCULAR | Status: AC
  Administered 2020-12-04: 6 [IU] via SUBCUTANEOUS
  Filled 2020-12-04: qty 1

## 2020-12-04 NOTE — ED Provider Notes (Signed)
[facility] Emergency Department Provider Note    Event Date/Time   First MD Initiated Contact with Patient 12/04/20 1053     (approximate)  I have reviewed the triage vital signs and the nursing notes.   HISTORY  Chief Complaint Wound Infection    HPI Lawrence Maynard is a 55 y.o. male with a history of poorly controlled diabetes as well as wound of left great toe has been progressively worsening over the past several weeks failing outpatient trial of management presents to the ER from podiatry clinic for admission for IV antibiotics MRI of foot and further management.  Is not have any fevers or chills.  Denies any other injury.  Is having some pain as his foot was debrided in clinic today.  Past Medical History:  Diagnosis Date   Diabetes mellitus without complication (HCC)    Gout    Hypertension    Obesity    PAD (peripheral artery disease) (HCC)    Family History  Problem Relation Age of Onset   Diabetes Mother    Hypertension Mother    Gout Mother    Diabetes Father    Hypertension Father    Gout Father    Past Surgical History:  Procedure Laterality Date   ABDOMINAL AORTOGRAM W/LOWER EXTREMITY Left 05/05/2020   Procedure: ABDOMINAL AORTOGRAM W/LOWER EXTREMITY;  Surgeon: Runell Gess, MD;  [facility];  Service: Cardiovascular;  Laterality: Left;   PERIPHERAL VASCULAR ATHERECTOMY Left 05/05/2020   Procedure: PERIPHERAL VASCULAR ATHERECTOMY;  Surgeon: Runell Gess, MD;  Location: [facility] INVASIVE CV LAB;  Service: Cardiovascular;  Laterality: Left;  pt trunk   PERIPHERAL VASCULAR INTERVENTION Left 05/05/2020   Procedure: PERIPHERAL VASCULAR INTERVENTION;  Surgeon: Runell Gess, MD;  [facility] CV LAB;  Service: Cardiovascular;  Laterality: Left;  pt trunk   Patient Active Problem List   Diagnosis Date Noted   Essential hypertension, benign 08/15/2020   Erectile dysfunction 08/15/2020   Uncontrolled type 2  diabetes mellitus with hyperglycemia (HCC) 08/15/2020   History of stroke 08/15/2020   Chronic gout without tophus 08/15/2020   Shakes 08/15/2020   Insulin dependent type 2 diabetes mellitus (HCC) 05/06/2020   Gout 05/06/2020   Critical lower limb ischemia (HCC) 05/05/2020   Essential hypertension 04/29/2020   Hyperlipidemia 04/29/2020   Peripheral arterial disease (HCC) 04/29/2020      Prior to Admission medications   Medication Sig Start Date End Date Taking? Authorizing Provider  allopurinol (ZYLOPRIM) 100 MG tablet Take 100 mg by mouth daily.    [provider]  allopurinol (ZYLOPRIM) 300 MG tablet Take 1 tablet (300 mg total) by mouth daily. Patient not taking: Reported on 11/30/2020 08/18/20 08/18/21  Tysinger, Kermit Balo, PA-C  amoxicillin-clavulanate (AUGMENTIN) 875-125 MG tablet Take 1 tablet by mouth every 12 (twelve) hours for 7 days. 11/30/20 12/07/20  Sabas Sous, MD  aspirin 81 MG chewable tablet Chew 1 tablet (81 mg total) by mouth daily. 08/18/20   Tysinger, Kermit Balo, PA-C  carvedilol (COREG) 6.25 MG tablet Take 6.25 mg by mouth 2 (two) times daily with a meal.    [provider]  clopidogrel (PLAVIX) 75 MG tablet Take 1 tablet (75 mg total) by mouth daily. 09/24/20   Runell Gess, MD  Empagliflozin-metFORMIN HCl (SYNJARDY) 12.07-998 MG TABS Take 1 tablet by mouth 2 (two) times daily. 08/18/20   Tysinger, Kermit Balo, PA-C  ibuprofen (ADVIL) 800 MG tablet Take 1 tablet (800 mg  total) by mouth every 6 (six) hours as needed. Patient taking differently: Take 800 mg by mouth every 6 (six) hours as needed for headache or moderate pain. 10/15/20   Candelaria Stagers, DPM  insulin glargine, 2 Unit Dial, (TOUJEO MAX SOLOSTAR) 300 UNIT/ML Solostar Pen Inject 80 Units into the skin daily. 08/18/20   Tysinger, Kermit Balo, PA-C  Insulin Pen Needle (BD PEN NEEDLE NANO U/F) 32G X 4 MM MISC 1 each by Does not apply route at bedtime. 08/18/20   Tysinger, Kermit Balo, PA-C  lisinopril (ZESTRIL)  40 MG tablet Take 1 tablet (40 mg total) by mouth daily. 09/24/20   Runell Gess, MD  oxyCODONE (ROXICODONE) 5 MG immediate release tablet Take 1 tablet (5 mg total) by mouth every 4 (four) hours as needed for severe pain. 11/30/20   Sabas Sous, MD  oxyCODONE-acetaminophen (PERCOCET) 5-325 MG tablet Take 1-2 tablets by mouth every 4 (four) hours as needed for severe pain. 11/17/20   Candelaria Stagers, DPM  predniSONE (DELTASONE) 20 MG tablet 2 tabs po daily x 3 days Patient not taking: No sig reported 10/05/20   Palumbo, April, MD  pregabalin (LYRICA) 50 MG capsule Take 1 capsule (50 mg total) by mouth 2 (two) times daily. Patient not taking: No sig reported 08/18/20   Tysinger, Kermit Balo, PA-C  REPATHA 140 MG/ML SOSY INJECT 140 MG SUBCUTANEOUSLY EVERY 14 DAYS Patient taking differently: Inject 140 mg into the skin every 14 (fourteen) days. 10/20/20   Tysinger, Kermit Balo, PA-C  rosuvastatin (CRESTOR) 20 MG tablet Take 1 tablet (20 mg total) by mouth at bedtime. Patient taking differently: Take 20 mg by mouth daily. 09/24/20   Runell Gess, MD  Semaglutide, 1 MG/DOSE, (OZEMPIC, 1 MG/DOSE,) 4 MG/3ML SOPN Inject 1 mg into the skin once a week. Patient not taking: No sig reported 08/18/20   Tysinger, Kermit Balo, PA-C  sildenafil (VIAGRA) 100 MG tablet Take 1 tablet (100 mg total) by mouth as needed for erectile dysfunction. 08/18/20 08/18/21  Tysinger, Kermit Balo, PA-C  traMADol (ULTRAM) 50 MG tablet Take 1 tablet (50 mg total) by mouth 3 (three) times daily. Patient taking differently: Take 50 mg by mouth every 6 (six) hours as needed for moderate pain. 04/25/20   Lenn Sink, DPM    Allergies Patient has no known allergies.    Social History Social History   Tobacco Use   Smoking status: Never   Smokeless tobacco: Never  Substance Use Topics   Alcohol use: Not Currently    Review of Systems Patient denies headaches, rhinorrhea, blurry vision, numbness, shortness of breath, chest pain,  edema, cough, abdominal pain, nausea, vomiting, diarrhea, dysuria, fevers, rashes or hallucinations unless otherwise stated above in HPI. ____________________________________________   PHYSICAL EXAM:  VITAL SIGNS: Vitals:   12/04/20 0930  BP: 110/67  Pulse: 88  Resp: 18  Temp: 98.5 F (36.9 C)  SpO2: 96%    Constitutional: Alert and oriented.  Eyes: Conjunctivae are normal.  Head: Atraumatic. Nose: No congestion/rhinnorhea. Mouth/Throat: Mucous membranes are moist.   Neck: No stridor. Painless ROM.  Cardiovascular: Normal rate, regular rhythm. Grossly normal heart sounds.  Good peripheral circulation. Respiratory: Normal respiratory effort.  No retractions. Lungs CTAB. Gastrointestinal: Soft and nontender. No distention. No abdominal bruits. No CVA tenderness. Genitourinary:  Musculoskeletal: left great toe wrapped in surgical dressing. No crepitus  No joint effusions. Neurologic:  Normal speech and language. No gross focal neurologic deficits are appreciated. No facial droop Skin:  Skin is warm, dry and intact. No rash noted. Psychiatric: Mood and affect are normal. Speech and behavior are normal.  ____________________________________________   LABS (all labs ordered are listed, but only abnormal results are displayed)  Results for orders placed or performed during the hospital encounter of 12/04/20 (from the past 24 hour(s))  CBC     Status: None   Collection Time: 12/04/20  9:36 AM  Result Value Ref Range   WBC 9.2 4.0 - 10.5 K/uL   RBC 4.98 4.22 - 5.81 MIL/uL   Hemoglobin 13.8 13.0 - 17.0 g/dL   HCT 61.9 50.9 - 32.6 %   MCV 86.3 80.0 - 100.0 fL   MCH 27.7 26.0 - 34.0 pg   MCHC 32.1 30.0 - 36.0 g/dL   RDW 71.2 45.8 - 09.9 %   Platelets 209 150 - 400 K/uL   nRBC 0.0 0.0 - 0.2 %  Basic metabolic panel     Status: Abnormal   Collection Time: 12/04/20  9:36 AM  Result Value Ref Range   Sodium 133 (L) 135 - 145 mmol/L   Potassium 5.1 3.5 - 5.1 mmol/L   Chloride  98 98 - 111 mmol/L   CO2 26 22 - 32 mmol/L   Glucose, Bld 230 (H) 70 - 99 mg/dL   BUN 29 (H) 6 - 20 mg/dL   Creatinine, Ser 8.33 (H) 0.61 - 1.24 mg/dL   Calcium 8.9 8.9 - 82.5 mg/dL   GFR, Estimated >05 >39 mL/min   Anion gap 9 5 - 15   ____________________________________________  EKG____________________________________________  RADIOLOGY  I personally reviewed all radiographic images ordered to evaluate for the above acute complaints and reviewed radiology reports and findings.  These findings were personally discussed with the patient.  Please see medical record for radiology report.  ____________________________________________   PROCEDURES  Procedure(s) performed:  Procedures    Critical Care performed: no ____________________________________________   INITIAL IMPRESSION / ASSESSMENT AND PLAN / ED COURSE  Pertinent labs & imaging results that were available during my care of the patient were reviewed by me and considered in my medical decision making (see chart for details).   DDX: diabetic foot wound, cellulitis, abscess, osteo  Lawrence Maynard is a 55 y.o. who presents to the ED with presentation as described above.  Patient with evidence of diabetic foot wound failing outpatient management center from podiatry clinic for admission for IV antibiotics MRI and further medical management.  Patient agreeable to plan.  Will give order IV antibiotics.  Will treat his hyperglycemia with subcu insulin.  Will give pain medication.  Have discussed with the patient and available family all diagnostics and treatments performed thus far and all questions were answered to the best of my ability. The patient demonstrates understanding and agreement with plan.        The patient was evaluated in Emergency Department today for the symptoms described in the history of present illness. He/she was evaluated in the context of the global COVID-19 pandemic, which necessitated consideration  that the patient might be at risk for infection with the SARS-CoV-2 virus that causes COVID-19. Institutional protocols and algorithms that pertain to the evaluation of patients at risk for COVID-19 are in a state of rapid change based on information released by regulatory bodies including the CDC and federal and state organizations. These policies and algorithms were followed during the patient's care in the ED.  As part of my medical decision making, I reviewed the following data within the electronic  MEDICAL RECORD NUMBER Nursing notes reviewed and incorporated, Labs reviewed, notes from prior ED visits and [place] Controlled Substance Database   ____________________________________________   FINAL CLINICAL IMPRESSION(S) / ED DIAGNOSES  Final diagnoses:  Diabetic foot infection (HCC)      NEW MEDICATIONS STARTED DURING THIS VISIT:  New Prescriptions   No medications on file     Note:  This document was prepared using Dragon voice recognition software and may include unintentional dictation errors.    Willy Eddyobinson, Obinna Ehresman, MD 12/04/20 1118

## 2020-12-04 NOTE — Progress Notes (Signed)
Pharmacy Antibiotic Note  Lawrence Maynard is a 55 y.o. male admitted on 12/04/2020 with cellulitis/diabetic wound infection. Pharmacy has been consulted for vancomycin dosing.  Patient with PMH of uncontrolled diabetes presenting with moderate-severe left great toe wound. Wound worsened over the past several weeks despite receiving doxycycline at home, followed by Augmentin starting on 9/4.  Patient is currently without leukocytosis and afebrile. Renal function slightly increased from baseline (BL SCr 1.2) with Scr today 1.28.   Vancomycin loading dose 1,000 mg x 1 given in the ED.   Plan: Will give vancomycin 1500 mg today to complete 2000 mg loading dose. Start maintenance dose vancomycin 1500 mg every 24 hours. Goal AUC 400-550 Estimated AUC 445.8 Cmax 33.8 Cmin 9.6 Daily CBC, Scr. Follow clinical course and LOT.  Height: 5\' 7"  (170.2 cm) Weight: 99.8 kg (220 lb) IBW/kg (Calculated) : 66.1  Temp (24hrs), Avg:98.5 F (36.9 C), Min:98.5 F (36.9 C), Max:98.5 F (36.9 C)  Recent Labs  Lab 11/29/20 2227 11/30/20 0106 12/04/20 0936  WBC 9.3  --  9.2  CREATININE 1.04  --  1.28*  LATICACIDVEN 2.3* 1.4  --     Estimated Creatinine Clearance: 75.1 mL/min (A) (by C-G formula based on SCr of 1.28 mg/dL (H)).    No Known Allergies  Antimicrobials this admission: 9/8 vancomycin >>  9/8 ceftriaxone >>  9/8 metronidazole >>  Dose adjustments this admission: None  Microbiology results:  None  Thank you for allowing pharmacy to be a part of this patient's care.  11/8, PharmD Pharmacy Resident  12/04/2020 2:55 PM

## 2020-12-04 NOTE — H&P (Signed)
History and Physical    Lawrence Maynard INO:[id number] DOB: 1965/05/14 DOA: 12/04/2020  Referring MD/NP/PA:   PCP: Carlena Hurl, PA-C   Patient coming from:  The patient is coming from home.  At baseline, pt is independent for most of ADL.        Chief Complaint: left great toe ulcer with infection  HPI: Lawrence Maynard is a 55 y.o. male with medical history significant of PAD (s/p of stent placement in left leg), hypertension, hyperlipidemia, diabetes mellitus, stroke, gout, who presents with left great toe ulcer with infection.  Patient states that he has a diabetic ulcer with infection in left great toe.  He has been followed up by podiatrist.  He was treated with doxycycline for several days, then switched to Augmentin on 9/4, but his left great toe infection has been progressively worsening.  Patient does not have fever or chills.  He has constant pain in left great toe, which is sharp, 8 out of 10 in severity, nonradiating.  He has bloody drainage.  Patient does not have chest pain, cough, shortness of breath.  No nausea vomiting, diarrhea or abdominal pain.  No symptoms of UTI. Patient was seen by podiatrist, Dr. Posey Pronto today, and sent to ED for further evaluation and treatment.   ED Course: pt was found to have a BC 11.2, pending COVID-19 PCR, GFR > 60, temperature normal, blood pressure 110/67, heart rate 88, RR 18, oxygen saturation 96% on room air.  X-ray of left foot did not show evidence of osteomyelitis.  Patient is admitted to [facility] bed as inpatient.  Dr. Lucky Cowboy of vascular surgery is consulted.  Review of Systems:   General: no fevers, chills, no body weight gain, has fatigue HEENT: no blurry vision, hearing changes or sore throat Respiratory: no dyspnea, coughing, wheezing CV: no chest pain, no palpitations GI: no nausea, vomiting, abdominal pain, diarrhea, constipation GU: no dysuria, burning on urination, increased urinary frequency, hematuria  Ext: no leg edema Neuro: no  unilateral weakness, numbness, or tingling, no vision change or hearing loss Skin: no rash. Has ulcer in left great toe. MSK: No muscle spasm, no deformity, no limitation of range of movement in spin Heme: No easy bruising.  Travel history: No recent long distant travel.  Allergy: No Known Allergies  Past Medical History:  Diagnosis Date   Diabetes mellitus without complication ([place])    Gout    Hypertension    Obesity    PAD (peripheral artery disease) ([place])     Past Surgical History:  Procedure Laterality Date   ABDOMINAL AORTOGRAM W/LOWER EXTREMITY Left 05/05/2020   Procedure: ABDOMINAL AORTOGRAM W/LOWER EXTREMITY;  Surgeon: Lorretta Harp, MD;  Location: [facility];  Service: Cardiovascular;  Laterality: Left;   PERIPHERAL VASCULAR ATHERECTOMY Left 05/05/2020   Procedure: PERIPHERAL VASCULAR ATHERECTOMY;  Surgeon: Lorretta Harp, MD;  Location: [facility];  Service: Cardiovascular;  Laterality: Left;  pt trunk   PERIPHERAL VASCULAR INTERVENTION Left 05/05/2020   Procedure: PERIPHERAL VASCULAR INTERVENTION;  Surgeon: Lorretta Harp, MD;  Location: [facility];  Service: Cardiovascular;  Laterality: Left;  pt trunk    Social History:  reports that he has never smoked. He has never used smokeless tobacco. He reports that he does not currently use alcohol. No history on file for drug use.  Family History:  Family History  Problem Relation Age of Onset   Diabetes Mother    Hypertension Mother    Gout Mother  Diabetes Father    Hypertension Father    Gout Father      Prior to Admission medications   Medication Sig Start Date End Date Taking? Authorizing Provider  allopurinol (ZYLOPRIM) 100 MG tablet Take 100 mg by mouth daily.    [provider]  allopurinol (ZYLOPRIM) 300 MG tablet Take 1 tablet (300 mg total) by mouth daily. Patient not taking: Reported on 11/30/2020 08/18/20 08/18/21  Tysinger, Camelia Eng, PA-C  amoxicillin-clavulanate  (AUGMENTIN) 875-125 MG tablet Take 1 tablet by mouth every 12 (twelve) hours for 7 days. 11/30/20 12/07/20  Maudie Flakes, MD  aspirin 81 MG chewable tablet Chew 1 tablet (81 mg total) by mouth daily. 08/18/20   Tysinger, Camelia Eng, PA-C  carvedilol (COREG) 6.25 MG tablet Take 6.25 mg by mouth 2 (two) times daily with a meal.    [provider]  clopidogrel (PLAVIX) 75 MG tablet Take 1 tablet (75 mg total) by mouth daily. 09/24/20   Lorretta Harp, MD  Empagliflozin-metFORMIN HCl (SYNJARDY) 12.07-998 MG TABS Take 1 tablet by mouth 2 (two) times daily. 08/18/20   Tysinger, Camelia Eng, PA-C  ibuprofen (ADVIL) 800 MG tablet Take 1 tablet (800 mg total) by mouth every 6 (six) hours as needed. Patient taking differently: Take 800 mg by mouth every 6 (six) hours as needed for headache or moderate pain. 10/15/20   Felipa Furnace, DPM  insulin glargine, 2 Unit Dial, (TOUJEO MAX SOLOSTAR) 300 UNIT/ML Solostar Pen Inject 80 Units into the skin daily. 08/18/20   Tysinger, Camelia Eng, PA-C  Insulin Pen Needle (BD PEN NEEDLE NANO U/F) 32G X 4 MM MISC 1 each by Does not apply route at bedtime. 08/18/20   Tysinger, Camelia Eng, PA-C  ketorolac (TORADOL) 10 MG tablet Take 10 mg by mouth every 6 (six) hours as needed. 11/29/20   [provider]  lisinopril (ZESTRIL) 40 MG tablet Take 1 tablet (40 mg total) by mouth daily. 09/24/20   Lorretta Harp, MD  oxyCODONE (ROXICODONE) 5 MG immediate release tablet Take 1 tablet (5 mg total) by mouth every 4 (four) hours as needed for severe pain. 11/30/20   Maudie Flakes, MD  oxyCODONE-acetaminophen (PERCOCET) 5-325 MG tablet Take 1-2 tablets by mouth every 4 (four) hours as needed for severe pain. 11/17/20   Felipa Furnace, DPM  predniSONE (DELTASONE) 20 MG tablet 2 tabs po daily x 3 days Patient not taking: No sig reported 10/05/20   Palumbo, April, MD  pregabalin (LYRICA) 50 MG capsule Take 1 capsule (50 mg total) by mouth 2 (two) times daily. Patient not taking: No sig  reported 08/18/20   Tysinger, Camelia Eng, PA-C  REPATHA 140 MG/ML SOSY INJECT 140 MG SUBCUTANEOUSLY EVERY 14 DAYS Patient taking differently: Inject 140 mg into the skin every 14 (fourteen) days. 10/20/20   Tysinger, Camelia Eng, PA-C  rosuvastatin (CRESTOR) 20 MG tablet Take 1 tablet (20 mg total) by mouth at bedtime. Patient taking differently: Take 20 mg by mouth daily. 09/24/20   Lorretta Harp, MD  Semaglutide, 1 MG/DOSE, (OZEMPIC, 1 MG/DOSE,) 4 MG/3ML SOPN Inject 1 mg into the skin once a week. Patient not taking: No sig reported 08/18/20   Tysinger, Camelia Eng, PA-C  sildenafil (VIAGRA) 100 MG tablet Take 1 tablet (100 mg total) by mouth as needed for erectile dysfunction. 08/18/20 08/18/21  Tysinger, Camelia Eng, PA-C  traMADol (ULTRAM) 50 MG tablet Take 1 tablet (50 mg total) by mouth 3 (three) times daily. Patient  taking differently: Take 50 mg by mouth every 6 (six) hours as needed for moderate pain. 04/25/20   Wallene Huh, DPM    Physical Exam: Vitals:   12/04/20 0931 12/04/20 0934 12/04/20 1710 12/04/20 1804  BP:   108/80 (!) 145/96  Pulse:   84 81  Resp:   17 16  Temp:    98.1 F (36.7 C)  TempSrc:      SpO2:   98% 97%  Weight: 99.8 kg     Height: _0  (1.702 m) _1  (1.702 m)     General: Not in acute distress HEENT:       Eyes: PERRL, EOMI, no scleral icterus.       ENT: No discharge from the ears and nose, no pharynx injection, no tonsillar enlargement.        Neck: No JVD, no bruit, no mass felt. Heme: No neck lymph node enlargement. Cardiac: S1/S2, RRR, No murmurs, No gallops or rubs. Respiratory: No rales, wheezing, rhonchi or rubs. GI: Soft, nondistended, nontender, no rebound pain, no organomegaly, BS present. GU: No hematuria Ext: No pitting leg edema bilaterally. 1+DP/PT pulse bilaterally. Musculoskeletal: No joint deformities, No joint redness or warmth, no limitation of ROM in spin. Skin: Has ulcers in left great toe with bloody drainage.     Neuro: Alert,  oriented X3, cranial nerves II-XII grossly intact, moves all extremities normally.  Psych: Patient is not psychotic, no suicidal or hemocidal ideation.  Labs on Admission: I have personally reviewed following labs and imaging studies  CBC: Recent Labs  Lab 11/29/20 2227 12/04/20 0936  WBC 9.3 9.2  NEUTROABS 6.0  --   HGB 14.0 13.8  HCT 45.0 43.0  MCV 86.7 86.3  PLT 241 500   Basic Metabolic Panel: Recent Labs  Lab 11/29/20 2227 12/04/20 0936  NA 136 133*  K 4.7 5.1  CL 102 98  CO2 25 26  GLUCOSE 289* 230*  BUN 25* 29*  CREATININE 1.04 1.28*  CALCIUM 8.9 8.9   GFR: Estimated Creatinine Clearance: 75.1 mL/min (A) (by C-G formula based on SCr of 1.28 mg/dL (H)). Liver Function Tests: No results for input(s): AST, ALT, ALKPHOS, BILITOT, PROT, ALBUMIN in the last 168 hours. No results for input(s): LIPASE, AMYLASE in the last 168 hours. No results for input(s): AMMONIA in the last 168 hours. Coagulation Profile: Recent Labs  Lab 12/04/20 1454  INR 0.9   Cardiac Enzymes: No results for input(s): CKTOTAL, CKMB, CKMBINDEX, TROPONINI in the last 168 hours. BNP (last 3 results) No results for input(s): PROBNP in the last 8760 hours. HbA1C: No results for input(s): HGBA1C in the last 72 hours. CBG: No results for input(s): GLUCAP in the last 168 hours. Lipid Profile: No results for input(s): CHOL, HDL, LDLCALC, TRIG, CHOLHDL, LDLDIRECT in the last 72 hours. Thyroid Function Tests: No results for input(s): TSH, T4TOTAL, FREET4, T3FREE, THYROIDAB in the last 72 hours. Anemia Panel: No results for input(s): VITAMINB12, FOLATE, FERRITIN, TIBC, IRON, RETICCTPCT in the last 72 hours. Urine analysis:    Component Value Date/Time   LABSPEC 1.020 08/15/2020 1506   BILIRUBINUR negative 08/15/2020 1506   KETONESUR negative 08/15/2020 1506   PROTEINUR negative 08/15/2020 1506   NITRITE Negative 08/15/2020 1506   LEUKOCYTESUR Negative 08/15/2020 1506   Sepsis  Labs: _2 (procalcitonin:4,lacticidven:4) ) Recent Results (from the past 240 hour(s))  Resp Panel by RT-PCR (Flu A&B, Covid) Nasopharyngeal Swab     Status: None   Collection Time: 12/04/20 11:42 AM  Specimen: Nasopharyngeal Swab; Nasopharyngeal(NP) swabs in vial transport medium  Result Value Ref Range Status   SARS Coronavirus 2 by RT PCR NEGATIVE NEGATIVE Final    Comment: (NOTE) SARS-CoV-2 target nucleic acids are NOT DETECTED.  The SARS-CoV-2 RNA is generally detectable in upper respiratory specimens during the acute phase of infection. The lowest concentration of SARS-CoV-2 viral copies this assay can detect is 138 copies/mL. A negative result does not preclude SARS-Cov-2 infection and should not be used as the sole basis for treatment or other patient management decisions. A negative result may occur with  improper specimen collection/handling, submission of specimen other than nasopharyngeal swab, presence of viral mutation(s) within the areas targeted by this assay, and inadequate number of viral copies(<138 copies/mL). A negative result must be combined with clinical observations, patient history, and epidemiological information. The expected result is Negative.  Fact Sheet for Patients:  EntrepreneurPulse.com.au  Fact Sheet for Healthcare Providers:  IncredibleEmployment.be  This test is no t yet approved or cleared by the [place] FDA and  has been authorized for detection and/or diagnosis of SARS-CoV-2 by FDA under an Emergency Use Authorization (EUA). This EUA will remain  in effect (meaning this test can be used) for the duration of the COVID-19 declaration under Section 564(b)(1) of the Act, 21 U.S.C.section 360bbb-3(b)(1), unless the authorization is terminated  or revoked sooner.       Influenza A by PCR NEGATIVE NEGATIVE Final   Influenza B by PCR NEGATIVE NEGATIVE Final    Comment: (NOTE) The Xpert Xpress  SARS-CoV-2/FLU/RSV plus assay is intended as an aid in the diagnosis of influenza from Nasopharyngeal swab specimens and should not be used as a sole basis for treatment. Nasal washings and aspirates are unacceptable for Xpert Xpress SARS-CoV-2/FLU/RSV testing.  Fact Sheet for Patients: EntrepreneurPulse.com.au  Fact Sheet for Healthcare Providers: IncredibleEmployment.be  This test is not yet approved or cleared by the [place] FDA and has been authorized for detection and/or diagnosis of SARS-CoV-2 by FDA under an Emergency Use Authorization (EUA). This EUA will remain in effect (meaning this test can be used) for the duration of the COVID-19 declaration under Section 564(b)(1) of the Act, 21 U.S.C. section 360bbb-3(b)(1), unless the authorization is terminated or revoked.  Performed at [facility], [address] [place], [place] [address]      Radiological Exams on Admission: DG Foot Complete Left  Result Date: 12/04/2020 CLINICAL DATA:  Left great toe swelling/infection EXAM: LEFT FOOT - COMPLETE 3+ VIEW COMPARISON:  None. FINDINGS: No fracture or dislocation of the left foot. There is mild bunion deformity of the first metatarsophalangeal joint with moderate associated arthrosis. Mild midfoot arthrosis. Soft tissue wound and dressing material about the medial left great toe. Vascular calcinosis. IMPRESSION: 1.  No fracture or dislocation of the left foot. 2. Soft tissue wound and dressing material about the medial left great toe. No bony erosion or sclerosis to suggest osteomyelitis. MRI is the most sensitive test for the detection of bone marrow edema and osteomyelitis if clinically suspected. Electronically Signed   By: Eddie Candle M.D.   On: 12/04/2020 12:01     EKG:  Not done in ED, will get one.   Assessment/Plan Principal Problem:   Diabetic infection of left foot_great toe Active Problems:   Essential hypertension    Hyperlipidemia   Peripheral arterial disease (HCC)   History of stroke   Type II diabetes mellitus with peripheral circulatory disorder (HCC)   Diabetic infection of left  foot_great toe: Patient failed outpatient oral antibiotic treatment.  Symptoms has been worsening.  Not septic.  Hemodynamically stable. Consulted Dr. Lucky Cowboy of [facility] and Dr. Posey Pronto of podiatry  - Admitted to [facility] bed as inpatient - Empiric antimicrobial treatment with vancomycin, Flagyl, Rocephin - PRN Zofran for nausea, morphine and Percocet for pain - Blood cultures x 2  - ESR and CRP - wound care consult - MRI-left foot  Essential hypertension -IV hydralazine as needed -Coreg, lisinopril  Hyperlipidemia -Crestor -pt is also on Repatha  Peripheral arterial disease ([place]): S/p of stent placement to left leg -Consulted Dr. Lucky Cowboy of vascular surgery  History of stroke -Continue aspirin, Crestor -Hold Plavix in case patient needed surgery  Type II diabetes mellitus with peripheral circulatory disorder [facility]): Recent A1c 9.1, poorly controlled.  Patient is taking Synjardy and glargine insulin -Decrease glargine insulin dose from 80 to 60 unit daily -Sliding scale insulin    DVT ppx: SQ Heparin    Code Status: Full code Family Communication: not done, no family member is at bed side.    Disposition Plan:  Anticipate discharge back to previous environment Consults called:  Dr. Lucky Cowboy of [facility] Admission status and Level of care: Med-Surg:    Med-surg bed as inpt      Status is: Inpatient  Remains inpatient appropriate because:Inpatient level of care appropriate due to severity of illness  Dispo: The patient is from: Home              Anticipated d/c is to: Home              Patient currently is not medically stable to d/c.   Difficult to place patient No        Date of Service 12/04/2020    [facility]   If 7PM-7AM, please contact night-coverage [email or web address] 12/04/2020, 6:24 PM

## 2020-12-04 NOTE — Progress Notes (Signed)
Subjective:  Patient ID: Lawrence Maynard, male    DOB: 1965-10-20,  MRN: [id number]  Chief Complaint  Patient presents with   Ingrown Toenail    Hallux wound check     55 y.o. male presents with the above complaint.  Patient presents with follow-up of left hallux wound that seems to have gotten progressively worse.  Patient states he was on his foot very aggressively over the past few days and it may have started with a blister that got worse over time and now has turned into infection.  Patient went in the emergency room yesterday who had told him that he needs to be admitted for IV antibiotics however patient refused and came to see me right away.  He denies any other acute complaint no nausea fever chills vomiting.  He is on antibiotics.  He has a history of peripheral vascular disease managed by Dr. Allyson Sabal.  He is an uncontrolled diabetic with last A1c of 9.1   Review of Systems: Negative except as noted in the HPI. Denies N/V/F/Ch.  Past Medical History:  Diagnosis Date   Diabetes mellitus without complication (HCC)    Gout    Hypertension    Obesity    PAD (peripheral artery disease) (HCC)     Current Outpatient Medications:    allopurinol (ZYLOPRIM) 100 MG tablet, Take 100 mg by mouth daily., Disp: , Rfl:    allopurinol (ZYLOPRIM) 300 MG tablet, Take 1 tablet (300 mg total) by mouth daily. (Patient not taking: Reported on 11/30/2020), Disp: 90 tablet, Rfl: 3   amoxicillin-clavulanate (AUGMENTIN) 875-125 MG tablet, Take 1 tablet by mouth every 12 (twelve) hours for 7 days., Disp: 14 tablet, Rfl: 0   aspirin 81 MG chewable tablet, Chew 1 tablet (81 mg total) by mouth daily., Disp: 90 tablet, Rfl: 3   carvedilol (COREG) 6.25 MG tablet, Take 6.25 mg by mouth 2 (two) times daily with a meal., Disp: , Rfl:    clopidogrel (PLAVIX) 75 MG tablet, Take 1 tablet (75 mg total) by mouth daily., Disp: 90 tablet, Rfl: 1   Empagliflozin-metFORMIN HCl (SYNJARDY) 12.07-998 MG TABS, Take 1 tablet by mouth  2 (two) times daily., Disp: 180 tablet, Rfl: 1   ibuprofen (ADVIL) 800 MG tablet, Take 1 tablet (800 mg total) by mouth every 6 (six) hours as needed. (Patient taking differently: Take 800 mg by mouth every 6 (six) hours as needed for headache or moderate pain.), Disp: 60 tablet, Rfl: 1   insulin glargine, 2 Unit Dial, (TOUJEO MAX SOLOSTAR) 300 UNIT/ML Solostar Pen, Inject 80 Units into the skin daily., Disp: 15 mL, Rfl: 3   Insulin Pen Needle (BD PEN NEEDLE NANO U/F) 32G X 4 MM MISC, 1 each by Does not apply route at bedtime., Disp: 100 each, Rfl: 11   lisinopril (ZESTRIL) 40 MG tablet, Take 1 tablet (40 mg total) by mouth daily., Disp: 90 tablet, Rfl: 1   oxyCODONE (ROXICODONE) 5 MG immediate release tablet, Take 1 tablet (5 mg total) by mouth every 4 (four) hours as needed for severe pain., Disp: 8 tablet, Rfl: 0   oxyCODONE-acetaminophen (PERCOCET) 5-325 MG tablet, Take 1-2 tablets by mouth every 4 (four) hours as needed for severe pain., Disp: 30 tablet, Rfl: 0   predniSONE (DELTASONE) 20 MG tablet, 2 tabs po daily x 3 days (Patient not taking: No sig reported), Disp: 6 tablet, Rfl: 0   pregabalin (LYRICA) 50 MG capsule, Take 1 capsule (50 mg total) by mouth 2 (two) times daily. (  Patient not taking: No sig reported), Disp: 180 capsule, Rfl: 0   REPATHA 140 MG/ML SOSY, INJECT 140 MG SUBCUTANEOUSLY EVERY 14 DAYS (Patient taking differently: Inject 140 mg into the skin every 14 (fourteen) days.), Disp: 2 mL, Rfl: 0   rosuvastatin (CRESTOR) 20 MG tablet, Take 1 tablet (20 mg total) by mouth at bedtime. (Patient taking differently: Take 20 mg by mouth daily.), Disp: 90 tablet, Rfl: 1   Semaglutide, 1 MG/DOSE, (OZEMPIC, 1 MG/DOSE,) 4 MG/3ML SOPN, Inject 1 mg into the skin once a week. (Patient not taking: No sig reported), Disp: 3 mL, Rfl: 5   sildenafil (VIAGRA) 100 MG tablet, Take 1 tablet (100 mg total) by mouth as needed for erectile dysfunction., Disp: 20 tablet, Rfl: 1   traMADol (ULTRAM) 50 MG  tablet, Take 1 tablet (50 mg total) by mouth 3 (three) times daily. (Patient taking differently: Take 50 mg by mouth every 6 (six) hours as needed for moderate pain.), Disp: 90 tablet, Rfl: 2  Current Facility-Administered Medications:    triamcinolone acetonide (KENALOG) 10 MG/ML injection 10 mg, 10 mg, Other, Once, Regal, Kirstie Peri, DPM  Social History   Tobacco Use  Smoking Status Never  Smokeless Tobacco Never    No Known Allergies Objective:  There were no vitals filed for this visit. There is no height or weight on file to calculate BMI. Constitutional Well developed. Well nourished.  Vascular Dorsalis pedis pulses non palpable bilaterally. Posterior tibial pulses non palpable bilaterally. Capillary refill normal to all digits.  No cyanosis or clubbing noted. Pedal hair growth normal.  Neurologic Normal speech. Oriented to person, place, and time. Epicritic sensation to light touch grossly present bilaterally.  Dermatologic Left hallux abscess decompressed at bedside today in clinic.  Wound probing down to fat layer.  Erythema noted circumferential around the toe.  Discoloration/dark discoloration noted to the hallux.  This could be signs of beginning changes of gangrene.    Orthopedic: Gait examination shows calcaneovalgus to many toe signs unable to recruit the arch with dorsiflexion of the hallux unable to perform single and double heel raise.   Radiographs: None Assessment:   1. Uncontrolled type 2 diabetes mellitus with hyperglycemia (HCC)   2. Toe abscess, left   3. At increased risk for emergency hospital admission      Plan:  Patient was evaluated and treated and all questions answered.  Left hallux toe abscess with purulent drainage with erythema with underlying peripheral artery disease -Clinically patient had purulent drainage upon decompress meant today in clinic.  With discoloration of the toe patient is a high risk of losing that digit.  I believe  patient will benefit from IV antibiotics and an MRI to rule out osteomyelitis.  If patient does not have any osteomyelitis, he would just need IV antibiotics for 48 to 72 hours followed by discharge on p.o. antibiotics.  I discussed with him that he will benefit from going over to the emergency room and to be admitted to the hospital.  Patient states understanding will head over to the emergency room. -He will need to be doing Betadine wet-to-dry dressing changes -I discussed with him that given that he was very aggressive on his foot over the last few days it may have led to a blister and worsening of the infection and has failed outpatient doxycycline. -Patient is known to Dr. Gery Pray who had attempted to open up the occluded knee popliteal artery.  Patient does have bleeding today in clinic.  However he  still high risk of losing the great toe. -He may benefit from vascular consult for further evaluation and work-up while in the hospital -Is an uncontrolled diabetic with last A1c of 9.1 making him a high risk of losing that digit versus the foot versus the leg.  Pes planovalgus -I explained to the patient the etiology of pes planovalgus and various treatment options were discussed.  Given that he has collapsing arch with calcaneovalgus to many toe signs I believe would benefit from custom-made orthotics to help control the hindfoot motion support the arch of the foot and may be even prevent pinch callus formation.  Patient agrees with plan like to proceed we will obtain orthotics -Orthotics were dispensed today.  This seem to be fitting and functioning well No follow-ups on file.

## 2020-12-04 NOTE — Consult Note (Addendum)
WOC Nurse Consult Note: Reason for Consult: WOC Nursing is simultaneously consulted with Vascular Surgery.  Patient with chronic, nonhealing left great toe ulceration was seen today by podiatric medicine (Dr. Concha Se) who advised patient to come to the ED for probable admission, antibiotic therapy, vascular assessment and possible amputation of the LGT. Dr. Allena Katz advises betadine dressing changes twice daily and I will implement that POC until and unless it is changed by another provider. Wound type:Infectious Pressure Injury POA: N/A Measurement:See photo  Wound bed:red, moist with yellow nonviable tissue (40%) Drainage (amount, consistency, odor) serosanguinous Periwound:edematous, erythematous Dressing procedure/placement/frequency: As noted above, I will implement the POC outlined in Dr. Eliane Decree note from earlier today i.e., a betadine dressing applied twice daily. I have provided a pressure redistribution heel boot for the left LE for protection from trauma or pressure.  An order from another in house Provider will supercede those I have entered. As noted above, Vascular Surgery is also simultaneously consulted.  WOC nursing team will not follow, but will remain available to this patient, the nursing and medical teams.  Please re-consult if needed. Thanks, Ladona Mow, MSN, RN, GNP, Hans [place]  Pager# [phone]

## 2020-12-04 NOTE — ED Triage Notes (Signed)
Pt to ED from Dr Allena Katz for infection to left great toe for 1 month. States went for check up today and was sent for admission for IV antibiotics.  VSS. Denies fevers Diabetic

## 2020-12-04 NOTE — ED Notes (Signed)
Wound re-dressed after dr Evorn Gong assessment, per dr Clyde Lundborg, this RN to just dry dress the wound and wait to see what the wound consultant recommends Pt states the lunch tray he received will cause his gout to be worse, so he needs grilled chicken instead, dietary called and another tray requested

## 2020-12-04 NOTE — ED Notes (Signed)
Vancoready not available

## 2020-12-04 NOTE — Consult Note (Signed)
[facility] Vascular Consult Note  MRN : [id number]  Lawrence Maynard is a 55 y.o. (December 08, 1965) male who presents with chief complaint of  Chief Complaint  Patient presents with   Wound Infection  .  History of Present Illness: I am asked to see the patient by Dr. Clyde Lundborg for PAD with ulceration of the left great toe. He has a known history of PAD with intervention Feb 2022 by Dr. Allyson Sabal in [place] that failed shortly after placement and apparently remains occluded at this time.  He says he was told that collateral blood flow had gotten flow down to his foot around the occlusion.  His wound continues to worsen.  He was seen by his podiatrist who recommended he come to the hospital for IV antibiotics which she is receiving now.  He also has gout in the toe is very painful.  Current Facility-Administered Medications  Medication Dose Route Frequency Provider Last Rate Last Admin   acetaminophen (TYLENOL) tablet 650 mg  650 mg Oral Q6H PRN Lorretta Harp, MD   650 mg at 12/04/20 1350   [START ON 12/05/2020] aspirin chewable tablet 81 mg  81 mg Oral Daily Lorretta Harp, MD       carvedilol (COREG) tablet 6.25 mg  6.25 mg Oral BID WC Lorretta Harp, MD       [START ON 12/05/2020] cefTRIAXone (ROCEPHIN) 2 g in sodium chloride 0.9 % 100 mL IVPB  2 g Intravenous Q24H Lorretta Harp, MD       heparin injection 5,000 Units  5,000 Units Subcutaneous Q8H Lorretta Harp, MD       hydrALAZINE (APRESOLINE) injection 5 mg  5 mg Intravenous Q2H PRN Lorretta Harp, MD       [START ON 12/05/2020] insulin aspart (novoLOG) injection 0-15 Units  0-15 Units Subcutaneous TID WC Lorretta Harp, MD       insulin aspart (novoLOG) injection 0-5 Units  0-5 Units Subcutaneous QHS Lorretta Harp, MD       insulin glargine-yfgn [facility]) injection 60 Units  60 Units Subcutaneous Daily Lorretta Harp, MD       [START ON 12/05/2020] lisinopril (ZESTRIL) tablet 40 mg  40 mg Oral Daily Lorretta Harp, MD       metroNIDAZOLE (FLAGYL) IVPB 500 mg  500 mg  Intravenous BID Lorretta Harp, MD   Held at 12/04/20 1507   morphine 2 MG/ML injection 2 mg  2 mg Intravenous Q3H PRN Lorretta Harp, MD       ondansetron [facility]) injection 4 mg  4 mg Intravenous Q8H PRN Lorretta Harp, MD       oxyCODONE-acetaminophen (PERCOCET/ROXICET) 5-325 MG per tablet 1 tablet  1 tablet Oral Q4H PRN Lorretta Harp, MD       rosuvastatin (CRESTOR) tablet 20 mg  20 mg Oral Daily Lorretta Harp, MD       triamcinolone acetonide (KENALOG) 10 MG/ML injection 10 mg  10 mg Other Once Regal, Norman S, DPM       vancomycin (VANCOREADY) IVPB 1500 mg/300 mL  1,500 mg Intravenous Q24H Jaynie Bream, [facility]       Current Outpatient Medications  Medication Sig Dispense Refill   amoxicillin-clavulanate (AUGMENTIN) 875-125 MG tablet Take 1 tablet by mouth every 12 (twelve) hours for 7 days. 14 tablet 0   aspirin 81 MG chewable tablet Chew 1 tablet (81 mg total) by mouth daily. 90 tablet 3   carvedilol (COREG) 6.25 MG tablet Take 6.25 mg by mouth 2 (two) times daily with  a meal.     clopidogrel (PLAVIX) 75 MG tablet Take 1 tablet (75 mg total) by mouth daily. 90 tablet 1   Empagliflozin-metFORMIN HCl (SYNJARDY) 12.07-998 MG TABS Take 1 tablet by mouth 2 (two) times daily. 180 tablet 1   insulin glargine, 2 Unit Dial, (TOUJEO MAX SOLOSTAR) 300 UNIT/ML Solostar Pen Inject 80 Units into the skin daily. 15 mL 3   lisinopril (ZESTRIL) 40 MG tablet Take 1 tablet (40 mg total) by mouth daily. 90 tablet 1   REPATHA 140 MG/ML SOSY INJECT 140 MG SUBCUTANEOUSLY EVERY 14 DAYS (Patient taking differently: Inject 140 mg into the skin every 14 (fourteen) days.) 2 mL 0   rosuvastatin (CRESTOR) 20 MG tablet Take 1 tablet (20 mg total) by mouth at bedtime. (Patient taking differently: Take 20 mg by mouth daily.) 90 tablet 1   allopurinol (ZYLOPRIM) 100 MG tablet Take 100 mg by mouth daily.     allopurinol (ZYLOPRIM) 300 MG tablet Take 1 tablet (300 mg total) by mouth daily. (Patient not taking: Reported on 11/30/2020) 90  tablet 3   ibuprofen (ADVIL) 800 MG tablet Take 1 tablet (800 mg total) by mouth every 6 (six) hours as needed. (Patient taking differently: Take 800 mg by mouth every 6 (six) hours as needed for headache or moderate pain.) 60 tablet 1   Insulin Pen Needle (BD PEN NEEDLE NANO U/F) 32G X 4 MM MISC 1 each by Does not apply route at bedtime. 100 each 11   ketorolac (TORADOL) 10 MG tablet Take 10 mg by mouth every 6 (six) hours as needed. (Patient not taking: No sig reported)     oxyCODONE (ROXICODONE) 5 MG immediate release tablet Take 1 tablet (5 mg total) by mouth every 4 (four) hours as needed for severe pain. (Patient not taking: No sig reported) 8 tablet 0   oxyCODONE-acetaminophen (PERCOCET) 5-325 MG tablet Take 1-2 tablets by mouth every 4 (four) hours as needed for severe pain. (Patient not taking: Reported on 12/04/2020) 30 tablet 0   predniSONE (DELTASONE) 20 MG tablet 2 tabs po daily x 3 days (Patient not taking: No sig reported) 6 tablet 0   pregabalin (LYRICA) 50 MG capsule Take 1 capsule (50 mg total) by mouth 2 (two) times daily. (Patient not taking: No sig reported) 180 capsule 0   Semaglutide, 1 MG/DOSE, (OZEMPIC, 1 MG/DOSE,) 4 MG/3ML SOPN Inject 1 mg into the skin once a week. (Patient not taking: No sig reported) 3 mL 5   sildenafil (VIAGRA) 100 MG tablet Take 1 tablet (100 mg total) by mouth as needed for erectile dysfunction. 20 tablet 1   traMADol (ULTRAM) 50 MG tablet Take 1 tablet (50 mg total) by mouth 3 (three) times daily. (Patient not taking: No sig reported) 90 tablet 2    Past Medical History:  Diagnosis Date   Diabetes mellitus without complication (HCC)    Gout    Hypertension    Obesity    PAD (peripheral artery disease) (HCC)     Past Surgical History:  Procedure Laterality Date   ABDOMINAL AORTOGRAM W/LOWER EXTREMITY Left 05/05/2020   Procedure: ABDOMINAL AORTOGRAM W/LOWER EXTREMITY;  Surgeon: Runell Gess, MD;  Location: [facility] INVASIVE CV LAB;  Service:  Cardiovascular;  Laterality: Left;   PERIPHERAL VASCULAR ATHERECTOMY Left 05/05/2020   Procedure: PERIPHERAL VASCULAR ATHERECTOMY;  Surgeon: Runell Gess, MD;  Location: [facility] INVASIVE CV LAB;  Service: Cardiovascular;  Laterality: Left;  pt trunk   PERIPHERAL VASCULAR INTERVENTION Left 05/05/2020  Procedure: PERIPHERAL VASCULAR INTERVENTION;  Surgeon: Runell Gess, MD;  Location: [facility] INVASIVE CV LAB;  Service: Cardiovascular;  Laterality: Left;  pt trunk     Social History   Tobacco Use   Smoking status: Never   Smokeless tobacco: Never  Substance Use Topics   Alcohol use: Not Currently     Family History  Problem Relation Age of Onset   Diabetes Mother    Hypertension Mother    Gout Mother    Diabetes Father    Hypertension Father    Gout Father     No Known Allergies   REVIEW OF SYSTEMS (Negative unless checked)  Constitutional: Weight loss  Fever  Chills Cardiac: Chest pain   Chest pressure   Palpitations   Shortness of breath when laying flat   Shortness of breath at rest   Shortness of breath with exertion. Vascular:  Pain in legs with walking   Pain in legs at rest   Pain in legs when laying flat   Claudication   Pain in feet when walking  Pain in feet at rest  Pain in feet when laying flat   History of DVT   Phlebitis   Swelling in legs   Varicose veins   Non-healing ulcers Pulmonary:   Uses home oxygen   Productive cough   Hemoptysis   Wheeze  COPD   Asthma Neurologic:  Dizziness  Blackouts   Seizures   History of stroke   History of TIA  Aphasia   Temporary blindness   Dysphagia   Weakness or numbness in arms   Weakness or numbness in legs Musculoskeletal:  Arthritis   Joint swelling   Joint pain   Low back pain Hematologic:  Easy bruising  Easy bleeding   Hypercoagulable state   Anemic  Hepatitis Gastrointestinal:  Blood in stool   Vomiting blood  Gastroesophageal  reflux/heartburn   Difficulty swallowing. Genitourinary:  Chronic kidney disease   Difficult urination  Frequent urination  Burning with urination   Blood in urine Skin:  Rashes   Ulcers   Wounds Psychological:  History of anxiety    History of major depression.  Physical Examination  Vitals:   12/04/20 0930 12/04/20 0931 12/04/20 0934 12/04/20 1710  BP: 110/67   108/80  Pulse: 88   84  Resp: 18   17  Temp: 98.5 F (36.9 C)     TempSrc: Oral     SpO2: 96%   98%  Weight:  99.8 kg    Height:   (1.702 m)  (1.702 m)    Body mass index is 34.46 kg/m. Gen:  WD/WN, NAD Head: [place]/AT, No temporalis wasting.  Ear/Nose/Throat: Hearing grossly intact, nares w/o erythema or drainage, oropharynx w/o Erythema/Exudate Eyes: Sclera non-icteric, conjunctiva clear Neck: Trachea midline.  No JVD.  Pulmonary:  Good air movement, respirations not labored, equal bilaterally.  Cardiac: RRR, normal S1, S2. Vascular:  Vessel Right Left  Radial Palpable Palpable                          PT 1+ palpable Trace palpable  DP 1+ palpable 1+ palpable   Gastrointestinal: soft, non-tender/non-distended. No guarding/reflex.  Musculoskeletal: M/S 5/5 throughout.  Left great toe is bandaged with some bloody drainage present.  Ace wrap around the foot.  Mild swelling in the left lower leg. Neurologic: Sensation grossly intact in extremities.  Symmetrical.  Speech is fluent. Motor exam as listed above.  Psychiatric: Judgment intact, Mood & affect appropriate for pt's clinical situation. Dermatologic: Left foot wound is currently dressed     CBC Lab Results  Component Value Date   WBC 9.2 12/04/2020   HGB 13.8 12/04/2020   HCT 43.0 12/04/2020   MCV 86.3 12/04/2020   PLT 209 12/04/2020    BMET    Component Value Date/Time   NA 133 (L) 12/04/2020 0936   NA 138 04/29/2020 1601   K 5.1 12/04/2020 0936   CL 98 12/04/2020 0936   CO2 26 12/04/2020 0936   GLUCOSE 230  (H) 12/04/2020 0936   BUN 29 (H) 12/04/2020 0936   BUN 33 (H) 04/29/2020 1601   CREATININE 1.28 (H) 12/04/2020 0936   CALCIUM 8.9 12/04/2020 0936   GFRNONAA >60 12/04/2020 0936   GFRAA 88 04/29/2020 1601   Estimated Creatinine Clearance: 75.1 mL/min (A) (by C-G formula based on SCr of 1.28 mg/dL (H)).  COAG Lab Results  Component Value Date   INR 0.9 12/04/2020    Radiology CT FOOT LEFT W CONTRAST  Result Date: 11/30/2020 CLINICAL DATA:  Question osteomyelitis of foot EXAM: CT OF THE LOWER LEFT EXTREMITY WITH CONTRAST TECHNIQUE: Multidetector CT imaging of the lower left extremity was performed according to the standard protocol following intravenous contrast administration. CONTRAST:  63mL OMNIPAQUE IOHEXOL 350 MG/ML SOLN COMPARISON:  None. FINDINGS: Bones/Joint/Cartilage Degenerative changes noted at the 1st MTP joint and IP joint. Bony erosions noted in the distal 1st metatarsal elated to arthritic changes. No bone destruction seen to suggest osteomyelitis. Ligaments Suboptimally assessed by CT. Muscles and Tendons Grossly unremarkable Soft tissues No soft tissue gas or fluid collection. IMPRESSION: No bone destruction seen to suggest osteomyelitis. No acute bony abnormality. If clinical suspicion for osteomyelitis continues, MRI would be recommended for further evaluation. Electronically Signed   By: Charlett Nose M.D.   On: 11/30/2020 03:48   DG Foot Complete Left  Result Date: 12/04/2020 CLINICAL DATA:  Left great toe swelling/infection EXAM: LEFT FOOT - COMPLETE 3+ VIEW COMPARISON:  None. FINDINGS: No fracture or dislocation of the left foot. There is mild bunion deformity of the first metatarsophalangeal joint with moderate associated arthrosis. Mild midfoot arthrosis. Soft tissue wound and dressing material about the medial left great toe. Vascular calcinosis. IMPRESSION: 1.  No fracture or dislocation of the left foot. 2. Soft tissue wound and dressing material about the medial left  great toe. No bony erosion or sclerosis to suggest osteomyelitis. MRI is the most sensitive test for the detection of bone marrow edema and osteomyelitis if clinically suspected. Electronically Signed   By: Lauralyn Primes M.D.   On: 12/04/2020 12:01   DG Toe Great Left  Result Date: 11/29/2020 CLINICAL DATA:  Possible infection, ingrown toenail EXAM: LEFT GREAT TOE COMPARISON:  None. FINDINGS: Degenerative changes in the 1st MTP and IP joints. No acute bony abnormality. Specifically, no fracture, subluxation, or dislocation. No bone destruction to suggest osteomyelitis. IMPRESSION: No acute bony abnormality. Electronically Signed   By: Charlett Nose M.D.   On: 11/29/2020 22:46      Assessment/Plan 1. PAD with ulceration LLE. Now with infection. Getting IV Abx. Have discussed this is a critical and limb threatening situation.  He likely needs angiogram with intervention since he has an occluded stent in the left leg and his perfusion is clearly not normal.  He would prefer to have this done as an outpatient by Dr. Allyson Sabal in [place].  I have told him if there is anything  I can do to help we are happy to help in any way. 2.  Diabetes. blood glucose control important in reducing the progression of atherosclerotic disease. Also, involved in wound healing. On appropriate medications. 3. Hypertension. blood pressure control important in reducing the progression of atherosclerotic disease. On appropriate oral medications.    Festus BarrenJason Maye Parkinson, MD  12/04/2020 5:35 PM    This note was created with Dragon medical transcription system.  Any error is purely unintentional

## 2020-12-04 NOTE — Progress Notes (Signed)
PHARMACY -  BRIEF ANTIBIOTIC NOTE   Pharmacy has received consult for cellulitis from an ED provider.  The patient's profile has been reviewed for ht/wt/allergies/indication/available labs.    One time order placed for vancomycin 1,000 mg x 1   Further antibiotics/pharmacy consults should be ordered by admitting physician if indicated.                       Thank you, Jaynie Bream, PharmD Pharmacy Resident  12/04/2020 10:43 AM

## 2020-12-04 NOTE — ED Notes (Signed)
Nikki RN aware of assigned bed 

## 2020-12-04 NOTE — ED Notes (Signed)
Pt ambulatory to room 32 from room 47 for admission hold, pt walked without difficulty, states that he has been dealing with this toe issue for the past month, states that he was seen in the ER this past weekend and was told that they wanted to admit him, pt reports that he didn't want to be admitted yet, that he wanted to see his podiatrist first to see what his recommendation was. Pt reports that a portion of tissue was removed from the area around his left great toe and dr patel's office this am pta to the ER

## 2020-12-05 ENCOUNTER — Inpatient Hospital Stay: Payer: BC Managed Care – PPO

## 2020-12-05 ENCOUNTER — Encounter: Payer: Self-pay | Admitting: Internal Medicine

## 2020-12-05 DIAGNOSIS — L089 Local infection of the skin and subcutaneous tissue, unspecified: Secondary | ICD-10-CM | POA: Diagnosis not present

## 2020-12-05 DIAGNOSIS — E11628 Type 2 diabetes mellitus with other skin complications: Secondary | ICD-10-CM | POA: Diagnosis not present

## 2020-12-05 LAB — CBC
HCT: 44.7 % (ref 39.0–52.0)
Hemoglobin: 13.8 g/dL (ref 13.0–17.0)
MCH: 26.5 pg (ref 26.0–34.0)
MCHC: 30.9 g/dL (ref 30.0–36.0)
MCV: 86 fL (ref 80.0–100.0)
Platelets: 221 10*3/uL (ref 150–400)
RBC: 5.2 MIL/uL (ref 4.22–5.81)
RDW: 13.3 % (ref 11.5–15.5)
WBC: 10.9 10*3/uL — ABNORMAL HIGH (ref 4.0–10.5)
nRBC: 0 % (ref 0.0–0.2)

## 2020-12-05 LAB — BASIC METABOLIC PANEL
Anion gap: 7 (ref 5–15)
BUN: 24 mg/dL — ABNORMAL HIGH (ref 6–20)
CO2: 28 mmol/L (ref 22–32)
Calcium: 8.9 mg/dL (ref 8.9–10.3)
Chloride: 98 mmol/L (ref 98–111)
Creatinine, Ser: 0.84 mg/dL (ref 0.61–1.24)
GFR, Estimated: >60 mL/min (ref >60)
Glucose, Bld: 206 mg/dL — ABNORMAL HIGH (ref 70–99)
Potassium: 4.9 mmol/L (ref 3.5–5.1)
Sodium: 133 mmol/L — ABNORMAL LOW (ref 135–145)

## 2020-12-05 LAB — GLUCOSE, CAPILLARY
Glucose-Capillary: 359 mg/dL — ABNORMAL HIGH (ref 70–99)
Glucose-Capillary: 235 mg/dL — ABNORMAL HIGH (ref 70–99)
Glucose-Capillary: 248 mg/dL — ABNORMAL HIGH (ref 70–99)
Glucose-Capillary: 194 mg/dL — ABNORMAL HIGH (ref 70–99)

## 2020-12-05 LAB — HIV ANTIBODY (ROUTINE TESTING W REFLEX): HIV Screen 4th Generation wRfx: NONREACTIVE

## 2020-12-05 MED ORDER — INSULIN ASPART 100 UNIT/ML IJ SOLN
4.0000 [IU] | Freq: Three times a day (TID) | INTRAMUSCULAR | Status: DC
  Administered 2020-12-05 – 2020-12-07 (×4): 4 [IU] via SUBCUTANEOUS
  Filled 2020-12-05 (×3): qty 1

## 2020-12-05 MED ORDER — SODIUM CHLORIDE 0.9 % IV SOLN: INTRAVENOUS | Status: DC

## 2020-12-05 MED ORDER — SODIUM CHLORIDE 0.9 % IV SOLN
12.5000 mg | Freq: Four times a day (QID) | INTRAVENOUS | Status: DC | PRN
  Filled 2020-12-05: qty 0.5

## 2020-12-05 MED ORDER — GADOBUTROL 1 MMOL/ML IV SOLN
7.5000 mL | Freq: Once | INTRAVENOUS | Status: AC | PRN
  Administered 2020-12-05: 02:00:00 7.5 mL via INTRAVENOUS

## 2020-12-05 MED ORDER — ALPRAZOLAM 0.25 MG PO TABS
0.2500 mg | ORAL_TABLET | Freq: Two times a day (BID) | ORAL | Status: DC | PRN
  Administered 2020-12-05: 0.25 mg via ORAL
  Filled 2020-12-05: qty 1

## 2020-12-05 MED ORDER — VANCOMYCIN HCL IN DEXTROSE 1-5 GM/200ML-% IV SOLN
1000.0000 mg | Freq: Two times a day (BID) | INTRAVENOUS | Status: DC
  Administered 2020-12-05 – 2020-12-07 (×4): 1000 mg via INTRAVENOUS
  Filled 2020-12-05 (×6): qty 200

## 2020-12-05 MED ORDER — INSULIN GLARGINE-YFGN 100 UNIT/ML ~~LOC~~ SOLN
65.0000 [IU] | Freq: Every day | SUBCUTANEOUS | Status: DC
  Administered 2020-12-05 – 2020-12-06 (×2): 65 [IU] via SUBCUTANEOUS
  Filled 2020-12-05 (×3): qty 0.65

## 2020-12-05 MED ORDER — KETOROLAC TROMETHAMINE 15 MG/ML IJ SOLN
15.0000 mg | Freq: Four times a day (QID) | INTRAMUSCULAR | Status: DC
  Administered 2020-12-05 – 2020-12-06 (×4): 15 mg via INTRAVENOUS
  Filled 2020-12-05 (×6): qty 1

## 2020-12-05 MED ORDER — METHYLPREDNISOLONE SODIUM SUCC 125 MG IJ SOLR
60.0000 mg | Freq: Once | INTRAMUSCULAR | Status: AC
  Administered 2020-12-05: 02:00:00 60 mg via INTRAVENOUS
  Filled 2020-12-05: qty 2

## 2020-12-05 MED ORDER — COLCHICINE 0.6 MG PO TABS
0.6000 mg | ORAL_TABLET | Freq: Two times a day (BID) | ORAL | Status: DC
  Administered 2020-12-05 – 2020-12-07 (×5): 0.6 mg via ORAL
  Filled 2020-12-05 (×7): qty 1

## 2020-12-05 NOTE — Progress Notes (Signed)
Inpatient Diabetes Program Recommendations  AACE/ADA: New Consensus Statement on Inpatient Glycemic Control   Target Ranges:  Prepandial:   less than 140 mg/dL      Peak postprandial:   less than 180 mg/dL (1-2 hours)      Critically ill patients:  140 - 180 mg/dL  Results for Lawrence Maynard, Lawrence Maynard (MRN [id number]) as of 12/05/2020 10:42  Ref. Range 12/04/2020 20:22 12/05/2020 09:47  Glucose-Capillary Latest Ref Range: 70 - 99 mg/dL 761 (H) 607 (H)    Results for Lawrence Maynard, Lawrence Maynard (MRN [id number]) as of 12/05/2020 10:42  Ref. Range 08/15/2020 10:10  Hemoglobin A1C Latest Ref Range: 4.8 - 5.6 % 9.1 (H)   Review of Glycemic Control  Diabetes history: DM2 Outpatient Diabetes medications: Synjardy 12.07-998 mg BID, Toujeo 80 units daily, Ozempic 1 mg Qweek, Humalog as need for hyperglycemia Current orders for Inpatient glycemic control: Semglee 60 units daily, Novolog 0-15 units TID with meals, Novolog 0-5 units QHS  Inpatient Diabetes Program Recommendations:    Insulin: Please consider increasing Semglee to 65 units daily and ordering Novolog 4 units TID with meals for meal coverage if patient eats at least 50% of meals.   HbgA1C: Please consider ordering an A1C to evaluate glycemic control over the past 2-3 months.   NOTE: Noted consult for diabetes coordinator. Patient admitted with left great toe infection. Noted patient seen Crosby Oyster, PA on 08/15/20 Called patient over the phone to inquire about DM medications and control. Patient confirms that he is taking DM medications as noted above. While on the phone with patient, he received a call from Dr. Allena Katz (Podiatry) to discuss MRI results. When patient resumed our conversation, he states he was told that he would need to have his toe amputated and he is upset about the news he received. In talking with patient, he states that his glucose is usually over 200 mg/dl and that when he takes Prednisone (takes when gout flares up) his glucose will go up to 300's or  higher. Patient notes that he received steroid injection yesterday at the hospital due to increased pain in toe due to gout. Patient states he last took Prednisone 4-5 days ago at home. Reviewed last A1c of 9.1% from 08/15/20 indicating an average glucose of 214 mg/dl. Discussed glucose and A1C goals. Discussed importance of maintaining good CBG control to prevent long-term and short-term complications. Explained how hyperglycemia leads to damage within blood vessels which lead to the common complications seen with uncontrolled diabetes. Stressed to the patient the importance of improving glycemic control to prevent further complications from uncontrolled diabetes and to promote wound healing.  Patient reports that he is moving and needs to find a PCP in [place], [place] that he can follow up with. Will consult TOC to see if they are able to provide a list of providers in [place] area. Asked patient to also check with his insurance to see if they have a list of providers in network for that area as well. Stressed importance of follow up after discharge and ongoing follow up to ensure he is working with provider to keep DM managed and get needed medications.  Patient states that he is almost out of Toujeo (pens), Humalog (pens), Synjardy, Ozempic, and insulin pen needles [phone]) and he would like to see if he can get prescriptions for DM medications at time of discharge.  Encouraged patient to work with providers outpatient to get DM under better control.  Patient verbalized understanding of information discussed and reports no  further questions at this time related to diabetes.  Thanks, Orlando Penner, RN, MSN, CDE Diabetes Coordinator Inpatient Diabetes Program [phone] (Team Pager from 8am to 5pm)

## 2020-12-05 NOTE — Progress Notes (Signed)
PROGRESS NOTE    Lawrence BeckwithJames Goulette  ZOX:[id number] DOB: 1965/09/08 DOA: 12/04/2020 PCP: Jac Canavanysinger, David S, PA-C   Brief Narrative:   55 y.o. male with medical history significant of PAD (s/p of stent placement in left leg), hypertension, hyperlipidemia, diabetes mellitus, stroke, gout, who presents with left great toe ulcer with infection.   Patient states that he has a diabetic ulcer with infection in left great toe.  He has been followed up by podiatrist.  He was treated with doxycycline for several days, then switched to Augmentin on 9/4, but his left great toe infection has been progressively worsening.  Patient does not have fever or chills.  He has constant pain in left great toe, which is sharp, 8 out of 10 in severity, nonradiating.  He has bloody drainage.    Seen in consultation by [facility] Dr. Wyn Quakerew.  At this time patient declining angiography that was recommended.  States he would rather follow-up with his established Physiological scientistvascular surgeon [place].  Podiatry consult is pending.  MRI demonstrating septic arthritis with evidence of osteomyelitis.   Assessment & Plan:   Principal Problem:   Diabetic infection of left foot_great toe Active Problems:   Essential hypertension   Hyperlipidemia   Peripheral arterial disease (HCC)   History of stroke   Type II diabetes mellitus with peripheral circulatory disorder (HCC)  Diabetic infection of left foot_great toe Associated septic arthritis/osteomyelitis Patient failed outpatient oral antibiotic treatment.   Symptoms has been worsening.   Not septic.  Hemodynamically stable.  Consulted Dr. Wyn Quakerew of [facility] and Dr. Allena KatzPatel of podiatry MRI with evidence of septic arthritis/osteomyelitis Declined angiography with vascular surgery, would rather follow-up with outpatient doctor in [place] Plan: Continue empiric broad-spectrum coverage vancomycin, Rocephin, Flagyl Follow blood cultures, no growth to date Follow podiatry recommendations Monitor vitals  and fever curve Wound care consult   Essential hypertension -IV hydralazine as needed -Coreg, lisinopril   Hyperlipidemia -Crestor -pt is also on Repatha   Peripheral arterial disease (HCC) S/p of stent placement to left leg -Consulted Dr. Wyn Quakerew of vascular surgery -Patient declined recommended angiography   History of stroke -Continue aspirin, Crestor -Hold Plavix in case patient needed surgery   Type II diabetes mellitus with peripheral circulatory disorder (HCC)  Recent A1c 9.1, poorly controlled.   Patient is taking Synjardy and glargine insulin Plan: Glargine 60 units daily, home dose is 80 units Sliding scale Carb modified diet Diabetes coordinator consult      DVT prophylaxis: SQ heparin Code Status: Full Family Communication: Wife over phone 9/9 Disposition Plan: Status is: Inpatient  Remains inpatient appropriate because:Inpatient level of care appropriate due to severity of illness  Dispo: The patient is from: Home              Anticipated d/c is to: Home              Patient currently is not medically stable to d/c.   Difficult to place patient No       Level of care: Med-Surg  Consultants:  Vascular surgery Podiatry  Procedures:  None  Antimicrobials:  Vancomycin Ceftriaxone Metronidazole   Subjective: Patient seen and examined.  Sitting comfortably in bed.  No visible distress.  Complains of pain in right knee.  Objective: Vitals:   12/04/20 1804 12/04/20 2012 12/05/20 0526 12/05/20 0806  BP: (!) 145/96 107/64 (!) 124/56 (!) 106/58  Pulse: 81 75 95 80  Resp: 16 18 17 18   Temp: 98.1 F (36.7 C) 98.6 F (37  C) 98 F (36.7 C) 97.9 F (36.6 C)  TempSrc:      SpO2: 97% 95% 93% 99%  Weight:      Height:        Intake/Output Summary (Last 24 hours) at 12/05/2020 1025 Last data filed at 12/05/2020 0959 Gross per 24 hour  Intake 1840 ml  Output 2000 ml  Net -160 ml   Filed Weights   12/04/20 0931  Weight: 99.8 kg     Examination:  General exam: Appears calm and comfortable  Respiratory system: Clear to auscultation. Respiratory effort normal. Cardiovascular system: S1 & S2 heard, RRR. No JVD, murmurs, rubs, gallops or clicks. No pedal edema. Gastrointestinal system: Abdomen is nondistended, soft and nontender. No organomegaly or masses felt. Normal bowel sounds heard. Central nervous system: Alert and oriented. No focal neurological deficits. Extremities: Right knee tender to touch, decreased ROM Skin: Ulcer left great toe with bloody drainage Psychiatry: Judgement and insight appear normal. Mood & affect appropriate.     Data Reviewed: I have personally reviewed following labs and imaging studies  CBC: Recent Labs  Lab 11/29/20 2227 12/04/20 0936 12/05/20 0456  WBC 9.3 9.2 10.9*  NEUTROABS 6.0  --   --   HGB 14.0 13.8 13.8  HCT 45.0 43.0 44.7  MCV 86.7 86.3 86.0  PLT 241 209 221   Basic Metabolic Panel: Recent Labs  Lab 11/29/20 2227 12/04/20 0936 12/05/20 0456  NA 136 133* 133*  K 4.7 5.1 4.9  CL 102 98 98  CO2 25 26 28   GLUCOSE 289* 230* 206*  BUN 25* 29* 24*  CREATININE 1.04 1.28* 0.84  CALCIUM 8.9 8.9 8.9   GFR: Estimated Creatinine Clearance: 114.5 mL/min (by C-G formula based on SCr of 0.84 mg/dL). Liver Function Tests: No results for input(s): AST, ALT, ALKPHOS, BILITOT, PROT, ALBUMIN in the last 168 hours. No results for input(s): LIPASE, AMYLASE in the last 168 hours. No results for input(s): AMMONIA in the last 168 hours. Coagulation Profile: Recent Labs  Lab 12/04/20 1454  INR 0.9   Cardiac Enzymes: No results for input(s): CKTOTAL, CKMB, CKMBINDEX, TROPONINI in the last 168 hours. BNP (last 3 results) No results for input(s): PROBNP in the last 8760 hours. HbA1C: No results for input(s): HGBA1C in the last 72 hours. CBG: Recent Labs  Lab 12/04/20 2022 12/05/20 0947  GLUCAP 199* 359*   Lipid Profile: No results for input(s): CHOL, HDL,  LDLCALC, TRIG, CHOLHDL, LDLDIRECT in the last 72 hours. Thyroid Function Tests: No results for input(s): TSH, T4TOTAL, FREET4, T3FREE, THYROIDAB in the last 72 hours. Anemia Panel: No results for input(s): VITAMINB12, FOLATE, FERRITIN, TIBC, IRON, RETICCTPCT in the last 72 hours. Sepsis Labs: Recent Labs  Lab 11/29/20 2227 11/30/20 0106  LATICACIDVEN 2.3* 1.4    Recent Results (from the past 240 hour(s))  Resp Panel by RT-PCR (Flu A&B, Covid) Nasopharyngeal Swab     Status: None   Collection Time: 12/04/20 11:42 AM   Specimen: Nasopharyngeal Swab; Nasopharyngeal(NP) swabs in vial transport medium  Result Value Ref Range Status   SARS Coronavirus 2 by RT PCR NEGATIVE NEGATIVE Final    Comment: (NOTE) SARS-CoV-2 target nucleic acids are NOT DETECTED.  The SARS-CoV-2 RNA is generally detectable in upper respiratory specimens during the acute phase of infection. The lowest concentration of SARS-CoV-2 viral copies this assay can detect is 138 copies/mL. A negative result does not preclude SARS-Cov-2 infection and should not be used as the sole basis for treatment or other  patient management decisions. A negative result may occur with  improper specimen collection/handling, submission of specimen other than nasopharyngeal swab, presence of viral mutation(s) within the areas targeted by this assay, and inadequate number of viral copies(<138 copies/mL). A negative result must be combined with clinical observations, patient history, and epidemiological information. The expected result is Negative.  Fact Sheet for Patients:  BloggerCourse.com  Fact Sheet for Healthcare Providers:  SeriousBroker.it  This test is no t yet approved or cleared by the [place] FDA and  has been authorized for detection and/or diagnosis of SARS-CoV-2 by FDA under an Emergency Use Authorization (EUA). This EUA will remain  in effect (meaning this test  can be used) for the duration of the COVID-19 declaration under Section 564(b)(1) of the Act, 21 U.S.C.section 360bbb-3(b)(1), unless the authorization is terminated  or revoked sooner.       Influenza A by PCR NEGATIVE NEGATIVE Final   Influenza B by PCR NEGATIVE NEGATIVE Final    Comment: (NOTE) The Xpert Xpress SARS-CoV-2/FLU/RSV plus assay is intended as an aid in the diagnosis of influenza from Nasopharyngeal swab specimens and should not be used as a sole basis for treatment. Nasal washings and aspirates are unacceptable for Xpert Xpress SARS-CoV-2/FLU/RSV testing.  Fact Sheet for Patients: BloggerCourse.com  Fact Sheet for Healthcare Providers: SeriousBroker.it  This test is not yet approved or cleared by the [place] FDA and has been authorized for detection and/or diagnosis of SARS-CoV-2 by FDA under an Emergency Use Authorization (EUA). This EUA will remain in effect (meaning this test can be used) for the duration of the COVID-19 declaration under Section 564(b)(1) of the Act, 21 U.S.C. section 360bbb-3(b)(1), unless the authorization is terminated or revoked.  Performed at [facility], [address] [place], [place] [address]          Radiology Studies: MR FOOT LEFT W WO CONTRAST  Result Date: 12/05/2020 CLINICAL DATA:  Diabetic foot ulcer involving the great toe. EXAM: MRI OF THE LEFT FOREFOOT WITHOUT AND WITH CONTRAST TECHNIQUE: Multiplanar, multisequence MR imaging of the left foot was performed both before and after administration of intravenous contrast. CONTRAST:  7.37mL GADAVIST GADOBUTROL 1 MMOL/ML IV SOLN COMPARISON:  Radiographs 12/02/2020 FINDINGS: Open wound noted on the plantar medial aspect of the great toe. Associated focal cellulitic changes. There is also a small focal fluid collection along the lateral aspect of the interphalangeal joint likely a small abscess. MR findings  consistent with septic arthritis at the interphalangeal joint of the great toe and associated osteomyelitis involving the distal aspect of the proximal phalanx and also the distal phalanx. Moderate degenerative changes at the first MTP joint with possible erosive changes away from the joint which could suggest gout. Myositis involving the forefoot musculature but no evidence of pyomyositis. IMPRESSION: 1. MR findings consistent with septic arthritis at the interphalangeal joint of the great toe and associated osteomyelitis involving the distal aspect of the proximal phalanx and also the distal phalanx. 2. Small abscess noted along the lateral aspect of the joint. 3. Myositis involving the forefoot musculature but no evidence of pyomyositis. Electronically Signed   By: Rudie Meyer M.D.   On: 12/05/2020 06:17   DG Foot Complete Left  Result Date: 12/04/2020 CLINICAL DATA:  Left great toe swelling/infection EXAM: LEFT FOOT - COMPLETE 3+ VIEW COMPARISON:  None. FINDINGS: No fracture or dislocation of the left foot. There is mild bunion deformity of the first metatarsophalangeal joint with moderate associated arthrosis. Mild midfoot arthrosis.  Soft tissue wound and dressing material about the medial left great toe. Vascular calcinosis. IMPRESSION: 1.  No fracture or dislocation of the left foot. 2. Soft tissue wound and dressing material about the medial left great toe. No bony erosion or sclerosis to suggest osteomyelitis. MRI is the most sensitive test for the detection of bone marrow edema and osteomyelitis if clinically suspected. Electronically Signed   By: Lauralyn Primes M.D.   On: 12/04/2020 12:01        Scheduled Meds:  aspirin  81 mg Oral Daily   carvedilol  6.25 mg Oral BID WC   colchicine  0.6 mg Oral BID   heparin  5,000 Units Subcutaneous Q8H   insulin aspart  0-15 Units Subcutaneous TID WC   insulin aspart  0-5 Units Subcutaneous QHS   insulin glargine-yfgn  60 Units Subcutaneous Daily    ketorolac  15 mg Intravenous Q6H   lisinopril  40 mg Oral Daily   rosuvastatin  20 mg Oral Daily   Continuous Infusions:  sodium chloride 100 mL/hr at 12/05/20 0952   cefTRIAXone (ROCEPHIN)  IV     metronidazole 500 mg (12/05/20 0954)   vancomycin       LOS: 1 day    Time spent: 25 minutes    Tresa Moore, MD [facility] Pager [phone]  If 7PM-7AM, please contact night-coverage 12/05/2020, 10:25 AM

## 2020-12-05 NOTE — Progress Notes (Addendum)
Pharmacy Antibiotic Note  Lawrence Maynard is a 55 y.o. male admitted on 12/04/2020 with cellulitis/diabetic wound infection (MRI showing septic arthritis and osteo).   Pharmacy has been consulted for vancomycin dosing.  Patient with PMH of uncontrolled diabetes presenting with moderate-severe left great toe wound. Wound worsened over the past several weeks despite receiving doxycycline at home, followed by Augmentin starting on 9/4.  Patient is currently without leukocytosis and afebrile. Renal function slightly improved today Scr 1.28>0.84.   Vancomycin loading dose 1,000 mg x 1 given in the ED.   Plan: Adjust vancomycin to 1000 mg every 12 hours. Goal AUC 400-550 Estimated AUC 481 Cmax 31.7 Cmin 12.7 Daily CBC, Scr. Follow clinical course and LOT.  Height: 5\' 7"  (170.2 cm) Weight: 99.8 kg (220 lb) IBW/kg (Calculated) : 66.1  Temp (24hrs), Avg:98.2 F (36.8 C), Min:97.9 F (36.6 C), Max:98.6 F (37 C)  Recent Labs  Lab 11/29/20 2227 11/30/20 0106 12/04/20 0936 12/05/20 0456  WBC 9.3  --  9.2 10.9*  CREATININE 1.04  --  1.28* 0.84  LATICACIDVEN 2.3* 1.4  --   --      Estimated Creatinine Clearance: 114.5 mL/min (by C-G formula based on SCr of 0.84 mg/dL).    No Known Allergies  Antimicrobials this admission: 9/8 vancomycin >>  9/8 ceftriaxone >>  9/8 metronidazole >>  Dose adjustments this admission: None  Microbiology results:  None  Thank you for allowing pharmacy to be a part of this patient's care.  11/8, PharmD, BCPS Clinical Pharmacist 12/05/2020 10:14 AM

## 2020-12-05 NOTE — TOC Progression Note (Signed)
Transition of Care [facility]) - Progression Note    Patient Details  Name: Sesar Madewell MRN: [id number] Date of Birth: Mar 26, 1966  Transition of Care [facility]) CM/SW Contact  Caryn Section, RN Phone Number: 12/05/2020, 4:35 PM  Clinical Narrative:   patient is moving to [facility] and will not have PCP.  Advised patient that PCP that are in network are available by calling the number on the back of his insurance card or going to the provider website.  If he needs further informaiton, TOC contact information given to patient.  TOC to follow for needs.  Patient was on the phone with his brother and could not speak with TOC further at this time.         Expected Discharge Plan and Services                                                 Social Determinants of Health (SDOH) Interventions    Readmission Risk Interventions No flowsheet data found.

## 2020-12-06 ENCOUNTER — Encounter
Admission: EM | Disposition: A | Discharge status: Home / Self Care | Payer: Self-pay | Source: Home / Self Care | Attending: Internal Medicine

## 2020-12-06 ENCOUNTER — Inpatient Hospital Stay: Payer: BC Managed Care – PPO

## 2020-12-06 ENCOUNTER — Inpatient Hospital Stay: Payer: BC Managed Care – PPO | Admitting: Anesthesiology

## 2020-12-06 DIAGNOSIS — L089 Local infection of the skin and subcutaneous tissue, unspecified: Secondary | ICD-10-CM | POA: Diagnosis not present

## 2020-12-06 DIAGNOSIS — M86671 Other chronic osteomyelitis, right ankle and foot: Secondary | ICD-10-CM | POA: Diagnosis not present

## 2020-12-06 DIAGNOSIS — E11628 Type 2 diabetes mellitus with other skin complications: Secondary | ICD-10-CM | POA: Diagnosis not present

## 2020-12-06 HISTORY — PX: AMPUTATION TOE: SHX6595

## 2020-12-06 LAB — CREATININE, SERUM
Creatinine, Ser: 0.96 mg/dL (ref 0.61–1.24)
GFR, Estimated: >60 mL/min (ref >60)

## 2020-12-06 LAB — GLUCOSE, CAPILLARY
Glucose-Capillary: 200 mg/dL — ABNORMAL HIGH (ref 70–99)
Glucose-Capillary: 147 mg/dL — ABNORMAL HIGH (ref 70–99)
Glucose-Capillary: 120 mg/dL — ABNORMAL HIGH (ref 70–99)
Glucose-Capillary: 247 mg/dL — ABNORMAL HIGH (ref 70–99)
Glucose-Capillary: 218 mg/dL — ABNORMAL HIGH (ref 70–99)

## 2020-12-06 SURGERY — AMPUTATION, TOE
Anesthesia: General | Site: Toe | Laterality: Left

## 2020-12-06 MED ORDER — PROPOFOL 500 MG/50ML IV EMUL
INTRAVENOUS | Status: DC | PRN
  Administered 2020-12-06: 150 ug/kg/min via INTRAVENOUS

## 2020-12-06 MED ORDER — 0.9 % SODIUM CHLORIDE (POUR BTL) OPTIME
TOPICAL | Status: DC | PRN
  Administered 2020-12-06: 1000 mL

## 2020-12-06 MED ORDER — GLYCOPYRROLATE 0.2 MG/ML IJ SOLN
INTRAMUSCULAR | Status: AC
  Filled 2020-12-06: qty 1

## 2020-12-06 MED ORDER — DEXMEDETOMIDINE HCL IN NACL 200 MCG/50ML IV SOLN
INTRAVENOUS | Status: AC
  Filled 2020-12-06: qty 50

## 2020-12-06 MED ORDER — PROPOFOL 500 MG/50ML IV EMUL
INTRAVENOUS | Status: AC
  Filled 2020-12-06: qty 50

## 2020-12-06 MED ORDER — LIDOCAINE HCL (PF) 1 % IJ SOLN
INTRAMUSCULAR | Status: DC | PRN
  Administered 2020-12-06: 8 mL
  Administered 2020-12-06: 5 mL

## 2020-12-06 MED ORDER — SODIUM CHLORIDE 0.9 % IV SOLN: INTRAVENOUS | Status: DC | PRN

## 2020-12-06 MED ORDER — OXYCODONE HCL 5 MG/5ML PO SOLN: 5.0000 mg | Freq: Once | ORAL | Status: DC | PRN

## 2020-12-06 MED ORDER — LIDOCAINE HCL (CARDIAC) PF 100 MG/5ML IV SOSY
PREFILLED_SYRINGE | INTRAVENOUS | Status: DC | PRN
  Administered 2020-12-06: 50 mg via INTRAVENOUS

## 2020-12-06 MED ORDER — KETOROLAC TROMETHAMINE 30 MG/ML IJ SOLN
15.0000 mg | Freq: Four times a day (QID) | INTRAMUSCULAR | Status: DC
  Administered 2020-12-06 – 2020-12-07 (×5): 15 mg via INTRAVENOUS
  Filled 2020-12-06 (×5): qty 1

## 2020-12-06 MED ORDER — OXYCODONE HCL 5 MG PO TABS: 5.0000 mg | ORAL_TABLET | Freq: Once | ORAL | Status: DC | PRN

## 2020-12-06 MED ORDER — LIDOCAINE HCL (PF) 2 % IJ SOLN
INTRAMUSCULAR | Status: AC
  Filled 2020-12-06: qty 5

## 2020-12-06 MED ORDER — DEXMEDETOMIDINE HCL IN NACL 200 MCG/50ML IV SOLN
INTRAVENOUS | Status: DC | PRN
  Administered 2020-12-06: 12 ug via INTRAVENOUS

## 2020-12-06 MED ORDER — BUPIVACAINE HCL 0.5 % IJ SOLN
INTRAMUSCULAR | Status: DC | PRN
  Administered 2020-12-06: 5 mL

## 2020-12-06 SURGICAL SUPPLY — 55 items
BLADE OSC/SAGITTAL MD 5.5X18 (BLADE) ×2 IMPLANT
BLADE SURG 15 STRL LF DISP TIS (BLADE) IMPLANT
BLADE SURG 15 STRL SS (BLADE) ×2
BLADE SURG MINI STRL (BLADE) ×2 IMPLANT
BNDG CMPR 75X41 PLY HI ABS (GAUZE/BANDAGES/DRESSINGS) ×1
BNDG CMPR STD VLCR NS LF 5.8X4 (GAUZE/BANDAGES/DRESSINGS) ×1
BNDG CONFORM 2 STRL LF (GAUZE/BANDAGES/DRESSINGS) ×2 IMPLANT
BNDG CONFORM 3 STRL LF (GAUZE/BANDAGES/DRESSINGS) ×4 IMPLANT
BNDG ELASTIC 4X5.8 VLCR NS LF (GAUZE/BANDAGES/DRESSINGS) ×2 IMPLANT
BNDG ELASTIC 4X5.8 VLCR STR LF (GAUZE/BANDAGES/DRESSINGS) ×1 IMPLANT
BNDG ESMARK 4X12 TAN STRL LF (GAUZE/BANDAGES/DRESSINGS) ×2 IMPLANT
BNDG GAUZE ELAST 4 BULKY (GAUZE/BANDAGES/DRESSINGS) ×2 IMPLANT
BNDG STRETCH 4X75 STRL LF (GAUZE/BANDAGES/DRESSINGS) ×1 IMPLANT
CUFF TOURN SGL QUICK 12 (TOURNIQUET CUFF) IMPLANT
CUFF TOURN SGL QUICK 18 (TOURNIQUET CUFF) ×1 IMPLANT
CUFF TOURN SGL QUICK 18X4 (TOURNIQUET CUFF) IMPLANT
DRAPE FLUOR MINI C-ARM 54X84 (DRAPES) ×2 IMPLANT
DRAPE XRAY CASSETTE 23X24 (DRAPES) ×2 IMPLANT
DURAPREP 26ML APPLICATOR (WOUND CARE) ×2 IMPLANT
ELECT REM PT RETURN 9FT ADLT (ELECTROSURGICAL) ×2
ELECTRODE REM PT RTRN 9FT ADLT (ELECTROSURGICAL) ×1 IMPLANT
GAUZE 4X4 16PLY ~~LOC~~+RFID DBL (SPONGE) ×2 IMPLANT
GAUZE PACKING IODOFORM 1/2 (PACKING) ×2 IMPLANT
GAUZE SPONGE 4X4 12PLY STRL (GAUZE/BANDAGES/DRESSINGS) ×2 IMPLANT
GAUZE XEROFORM 1X8 LF (GAUZE/BANDAGES/DRESSINGS) ×2 IMPLANT
GLOVE SURG ENC MOIS LTX SZ7.5 (GLOVE) ×2 IMPLANT
GLOVE SURG UNDER LTX SZ8 (GLOVE) ×2 IMPLANT
GOWN STRL REUS W/ TWL XL LVL3 (GOWN DISPOSABLE) ×2 IMPLANT
GOWN STRL REUS W/TWL XL LVL3 (GOWN DISPOSABLE) ×4
IV NS IRRIG 3000ML ARTHROMATIC (IV SOLUTION) ×2 IMPLANT
KIT TURNOVER KIT A (KITS) ×2 IMPLANT
LABEL OR SOLS (LABEL) ×2 IMPLANT
MANIFOLD NEPTUNE II (INSTRUMENTS) ×2 IMPLANT
NDL FILTER BLUNT 18X1 1/2 (NEEDLE) ×1 IMPLANT
NDL HYPO 25X1 1.5 SAFETY (NEEDLE) ×1 IMPLANT
NEEDLE FILTER BLUNT 18X 1/2SAF (NEEDLE) ×1
NEEDLE FILTER BLUNT 18X1 1/2 (NEEDLE) ×1 IMPLANT
NEEDLE HYPO 25X1 1.5 SAFETY (NEEDLE) ×4 IMPLANT
NS IRRIG 500ML POUR BTL (IV SOLUTION) ×2 IMPLANT
PACK EXTREMITY ARMC (MISCELLANEOUS) ×2 IMPLANT
PAD ABD DERMACEA PRESS 5X9 (GAUZE/BANDAGES/DRESSINGS) ×3 IMPLANT
PAD CAST CTTN 4X4 STRL (SOFTGOODS) IMPLANT
PADDING CAST COTTON 4X4 STRL (SOFTGOODS) ×2
PULSAVAC PLUS IRRIG FAN TIP (DISPOSABLE) ×2
SHIELD FULL FACE ANTIFOG 7M (MISCELLANEOUS) ×2 IMPLANT
STAPLER SKIN PROX 35W (STAPLE) ×1 IMPLANT
STOCKINETTE M/LG 89821 (MISCELLANEOUS) ×2 IMPLANT
STRAP SAFETY 5IN WIDE (MISCELLANEOUS) ×2 IMPLANT
SUT ETHILON 3-0 FS-10 30 BLK (SUTURE) ×4
SUT ETHILON 5-0 FS-2 18 BLK (SUTURE) ×2 IMPLANT
SUT VIC AB 4-0 FS2 27 (SUTURE) ×2 IMPLANT
SUTURE EHLN 3-0 FS-10 30 BLK (SUTURE) ×1 IMPLANT
SYR 10ML LL (SYRINGE) ×6 IMPLANT
TIP FAN IRRIG PULSAVAC PLUS (DISPOSABLE) ×1 IMPLANT
WATER STERILE IRR 500ML POUR (IV SOLUTION) ×2 IMPLANT

## 2020-12-06 NOTE — Anesthesia Postprocedure Evaluation (Signed)
Anesthesia Post Note  Patient: Lawrence Maynard  Procedure(s) Performed: AMPUTATION TOE-Great Left Toe (Left: Toe)  Patient location during evaluation: PACU Anesthesia Type: General Level of consciousness: awake and alert Pain management: pain level controlled Vital Signs Assessment: post-procedure vital signs reviewed and stable Respiratory status: spontaneous breathing, nonlabored ventilation and respiratory function stable Cardiovascular status: blood pressure returned to baseline and stable Postop Assessment: no apparent nausea or vomiting Anesthetic complications: no   No notable events documented.   Last Vitals:  Vitals:   12/06/20 1125 12/06/20 1607  BP: 136/89 121/62  Pulse: 66 74  Resp: 15 16  Temp: 36.5 C 36.7 C  SpO2: 93% 98%    Last Pain:  Vitals:   12/06/20 1125  TempSrc: Axillary  PainSc: Asleep                 Foye Deer

## 2020-12-06 NOTE — Brief Op Note (Signed)
12/06/2020  9:22 AM  PATIENT:  Lawrence Maynard  56 y.o. male  PRE-OPERATIVE DIAGNOSIS:  Gangrene Left toe  POST-OPERATIVE DIAGNOSIS:  Gangrene Left toe  PROCEDURE:  Procedure(s): AMPUTATION TOE-Great Left Toe (Left)  SURGEON:  Surgeon(s) and Role:    Edrick Kins, DPM - Primary  PHYSICIAN ASSISTANT:   ASSISTANTS: none   ANESTHESIA:   local and MAC  EBL:  minimal   BLOOD ADMINISTERED:none  DRAINS: none   LOCAL MEDICATIONS USED:  MARCAINE    and LIDOCAINE   SPECIMEN:  No Specimen  DISPOSITION OF SPECIMEN:  N/A  COUNTS:  YES  TOURNIQUET:  * Missing tourniquet times found for documented tourniquets in log: [id number] *  DICTATION: .Viviann Spare Dictation  PLAN OF CARE: Admit to inpatient   PATIENT DISPOSITION:  PACU - hemodynamically stable.   Delay start of Pharmacological VTE agent (>24hrs) due to surgical blood loss or risk of bleeding: not applicable

## 2020-12-06 NOTE — Interval H&P Note (Signed)
History and Physical Interval Note:  12/06/2020 8:46 AM  Lawrence Maynard  has presented today for surgery, with the diagnosis of Gangrene Left toe.  The various methods of treatment have been discussed with the patient and family. After consideration of risks, benefits and other options for treatment, the patient has consented to  Procedure(s): AMPUTATION TOE-Great Left Toe (Left) as a surgical intervention.  The patient's history has been reviewed, patient examined, no change in status, stable for surgery.  I have reviewed the patient's chart and labs.  Questions were answered to the patient's satisfaction.     Felecia Shelling

## 2020-12-06 NOTE — Anesthesia Preprocedure Evaluation (Addendum)
Anesthesia Evaluation  Patient identified by MRN, date of birth, ID band Patient awake    Reviewed: Allergy & Precautions, NPO status , Patient's Chart, lab work & pertinent test results, reviewed documented beta blocker date and time   Airway Mallampati: III  TM Distance: >3 FB Neck ROM: Full    Dental  (+) Missing, Caps, Poor Dentition,    Pulmonary neg pulmonary ROS,    Pulmonary exam normal breath sounds clear to auscultation       Cardiovascular Exercise Tolerance: Good hypertension, + Peripheral Vascular Disease (s/p of stent placement in left leg)  Normal cardiovascular exam Rhythm:Regular Rate:Normal     Neuro/Psych CVA negative psych ROS   GI/Hepatic negative GI ROS, Neg liver ROS,   Endo/Other  diabetes (Recent A1c 9.1), Poorly Controlled, Type 2, Insulin Dependent  Renal/GU negative Renal ROS     Musculoskeletal  (+) Arthritis ,   Abdominal (+) + obese,   Peds  Hematology  (+) Blood dyscrasia, ,   Anesthesia Other Findings Amputation of Great Left Toe for left great toe ulcer with infection.  Reproductive/Obstetrics                            Anesthesia Physical Anesthesia Plan  ASA: 3  Anesthesia Plan: General   Post-op Pain Management:    Induction: Intravenous  PONV Risk Score and Plan: 2 and Propofol infusion and Treatment may vary due to age or medical condition  Airway Management Planned: Natural Airway and Nasal Cannula  Additional Equipment:   Intra-op Plan:   Post-operative Plan:   Informed Consent: I have reviewed the patients History and Physical, chart, labs and discussed the procedure including the risks, benefits and alternatives for the proposed anesthesia with the patient or authorized representative who has indicated his/her understanding and acceptance.     Dental advisory given  Plan Discussed with: Anesthesiologist, CRNA and  Surgeon  Anesthesia Plan Comments: (Patient consented for risks of anesthesia including but not limited to:  - adverse reactions to medications - risk of airway placement if required - damage to eyes, teeth, lips or other oral mucosa - nerve damage due to positioning  - sore throat or hoarseness - Damage to heart, brain, nerves, lungs, other parts of body or loss of life  Patient voiced understanding.)       Anesthesia Quick Evaluation

## 2020-12-06 NOTE — Progress Notes (Signed)
PROGRESS NOTE    Lawrence Maynard  UXL:[id number] DOB: 08/16/1965 DOA: 12/04/2020 PCP: Jac Canavan, PA-C   Brief Narrative:   55 y.o. male with medical history significant of PAD (s/p of stent placement in left leg), hypertension, hyperlipidemia, diabetes mellitus, stroke, gout, who presents with left great toe ulcer with infection.   Patient states that he has a diabetic ulcer with infection in left great toe.  He has been followed up by podiatrist.  He was treated with doxycycline for several days, then switched to Augmentin on 9/4, but his left great toe infection has been progressively worsening.  Patient does not have fever or chills.  He has constant pain in left great toe, which is sharp, 8 out of 10 in severity, nonradiating.  He has bloody drainage.    Seen in consultation by [facility] Dr. Wyn Quaker.  At this time patient declining angiography that was recommended.  States he would rather follow-up with his established Physiological scientist in [employer].    Podiatry consulted.  Patient recommended left hallux amputation.  Surgery was done on 9/10.   Assessment & Plan:   Principal Problem:   Diabetic infection of left foot_great toe Active Problems:   Essential hypertension   Hyperlipidemia   Peripheral arterial disease (HCC)   History of stroke   Type II diabetes mellitus with peripheral circulatory disorder (HCC)  Diabetic infection of left foot_great toe Associated septic arthritis/osteomyelitis Patient failed outpatient oral antibiotic treatment.   Symptoms has been worsening.   Not septic.  Hemodynamically stable.  Consulted Dr. Wyn Quaker of [facility] and Dr. Allena Katz of podiatry MRI with evidence of septic arthritis/osteomyelitis Declined angiography with vascular surgery, would rather follow-up with outpatient doctor in [facility] Status post left hallux amputation 9/10 Plan: Postoperative wound care Continue empiric broad-spectrum coverage for now Follow blood cultures, no growth to date Can  likely de-escalate antibiotics within 24 hours Monitor vitals and fever curve   Essential hypertension -IV hydralazine as needed -Coreg, lisinopril   Hyperlipidemia -Crestor -pt is also on Repatha   Peripheral arterial disease (HCC) S/p of stent placement to left leg -Consulted Dr. Wyn Quaker of vascular surgery -Patient declined recommended angiography   History of stroke -Continue aspirin, Crestor -Hold Plavix in case patient needed surgery   Type II diabetes mellitus with peripheral circulatory disorder (HCC)  Recent A1c 9.1, poorly controlled.   Patient is taking Synjardy and glargine insulin Plan: Glargine 65 units daily, home dose is 80 units NovoLog 3 times daily with meals Sliding scale Carb modified diet Diabetes coordinator consult      DVT prophylaxis: SQ heparin Code Status: Full Family Communication: Wife at bedside 9/10 Disposition Plan: Status is: Inpatient  Remains inpatient appropriate because:Inpatient level of care appropriate due to severity of illness  Dispo: The patient is from: Home              Anticipated d/c is to: Home              Patient currently is not medically stable to d/c.   Difficult to place patient No       Level of care: Med-Surg  Consultants:  Vascular surgery Podiatry  Procedures:  None  Antimicrobials:  Vancomycin Ceftriaxone Metronidazole   Subjective: Patient seen and examined.  Understandably upset about the prospect of to amputation.  Objective: Vitals:   12/06/20 0721 12/06/20 0925 12/06/20 0939 12/06/20 0951  BP: (!) 154/91 127/85 (!) 132/98 (!) 133/98  Pulse: 79 76 75 66  Resp: 16 (!)  24 20 18   Temp: 98.6 F (37 C) (!) 96.9 F (36.1 C)  (!) 97 F (36.1 C)  TempSrc:      SpO2: 98% 100%    Weight:      Height:        Intake/Output Summary (Last 24 hours) at 12/06/2020 1000 Last data filed at 12/06/2020 0924 Gross per 24 hour  Intake 1317.25 ml  Output 415 ml  Net 902.25 ml   Filed Weights    12/04/20 0931  Weight: 99.8 kg    Examination:  General exam: No acute distress Respiratory system: Clear to auscultation. Respiratory effort normal. Cardiovascular system: S1-S2, RRR, no murmurs, no pedal edema Gastrointestinal system: Soft, NT/ND, normal bowel sounds Central nervous system: Alert and oriented. No focal neurological deficits. Extremities: Right knee tender to touch, decreased ROM Skin: Ulcer left great toe with bloody drainage Psychiatry: Judgement and insight appear normal. Mood & affect appropriate.     Data Reviewed: I have personally reviewed following labs and imaging studies  CBC: Recent Labs  Lab 11/29/20 2227 12/04/20 0936 12/05/20 0456  WBC 9.3 9.2 10.9*  NEUTROABS 6.0  --   --   HGB 14.0 13.8 13.8  HCT 45.0 43.0 44.7  MCV 86.7 86.3 86.0  PLT 241 209 221   Basic Metabolic Panel: Recent Labs  Lab 11/29/20 2227 12/04/20 0936 12/05/20 0456 12/06/20 0557  NA 136 133* 133*  --   K 4.7 5.1 4.9  --   CL 102 98 98  --   CO2 25 26 28   --   GLUCOSE 289* 230* 206*  --   BUN 25* 29* 24*  --   CREATININE 1.04 1.28* 0.84 0.96  CALCIUM 8.9 8.9 8.9  --    GFR: Estimated Creatinine Clearance: 100.2 mL/min (by C-G formula based on SCr of 0.96 mg/dL). Liver Function Tests: No results for input(s): AST, ALT, ALKPHOS, BILITOT, PROT, ALBUMIN in the last 168 hours. No results for input(s): LIPASE, AMYLASE in the last 168 hours. No results for input(s): AMMONIA in the last 168 hours. Coagulation Profile: Recent Labs  Lab 12/04/20 1454  INR 0.9   Cardiac Enzymes: No results for input(s): CKTOTAL, CKMB, CKMBINDEX, TROPONINI in the last 168 hours. BNP (last 3 results) No results for input(s): PROBNP in the last 8760 hours. HbA1C: No results for input(s): HGBA1C in the last 72 hours. CBG: Recent Labs  Lab 12/05/20 1202 12/05/20 1633 12/05/20 2141 12/06/20 0722 12/06/20 0926  GLUCAP 235* 248* 194* 200* 147*   Lipid Profile: No results for  input(s): CHOL, HDL, LDLCALC, TRIG, CHOLHDL, LDLDIRECT in the last 72 hours. Thyroid Function Tests: No results for input(s): TSH, T4TOTAL, FREET4, T3FREE, THYROIDAB in the last 72 hours. Anemia Panel: No results for input(s): VITAMINB12, FOLATE, FERRITIN, TIBC, IRON, RETICCTPCT in the last 72 hours. Sepsis Labs: Recent Labs  Lab 11/29/20 2227 11/30/20 0106  LATICACIDVEN 2.3* 1.4    Recent Results (from the past 240 hour(s))  Resp Panel by RT-PCR (Flu A&B, Covid) Nasopharyngeal Swab     Status: None   Collection Time: 12/04/20 11:42 AM   Specimen: Nasopharyngeal Swab; Nasopharyngeal(NP) swabs in vial transport medium  Result Value Ref Range Status   SARS Coronavirus 2 by RT PCR NEGATIVE NEGATIVE Final    Comment: (NOTE) SARS-CoV-2 target nucleic acids are NOT DETECTED.  The SARS-CoV-2 RNA is generally detectable in upper respiratory specimens during the acute phase of infection. The lowest concentration of SARS-CoV-2 viral copies this assay can detect  is 138 copies/mL. A negative result does not preclude SARS-Cov-2 infection and should not be used as the sole basis for treatment or other patient management decisions. A negative result may occur with  improper specimen collection/handling, submission of specimen other than nasopharyngeal swab, presence of viral mutation(s) within the areas targeted by this assay, and inadequate number of viral copies(<138 copies/mL). A negative result must be combined with clinical observations, patient history, and epidemiological information. The expected result is Negative.  Fact Sheet for Patients:  BloggerCourse.com  Fact Sheet for Healthcare Providers:  SeriousBroker.it  This test is no t yet approved or cleared by the [place] FDA and  has been authorized for detection and/or diagnosis of SARS-CoV-2 by FDA under an Emergency Use Authorization (EUA). This EUA will remain  in effect  (meaning this test can be used) for the duration of the COVID-19 declaration under Section 564(b)(1) of the Act, 21 U.S.C.section 360bbb-3(b)(1), unless the authorization is terminated  or revoked sooner.       Influenza A by PCR NEGATIVE NEGATIVE Final   Influenza B by PCR NEGATIVE NEGATIVE Final    Comment: (NOTE) The Xpert Xpress SARS-CoV-2/FLU/RSV plus assay is intended as an aid in the diagnosis of influenza from Nasopharyngeal swab specimens and should not be used as a sole basis for treatment. Nasal washings and aspirates are unacceptable for Xpert Xpress SARS-CoV-2/FLU/RSV testing.  Fact Sheet for Patients: BloggerCourse.com  Fact Sheet for Healthcare Providers: SeriousBroker.it  This test is not yet approved or cleared by the [place] FDA and has been authorized for detection and/or diagnosis of SARS-CoV-2 by FDA under an Emergency Use Authorization (EUA). This EUA will remain in effect (meaning this test can be used) for the duration of the COVID-19 declaration under Section 564(b)(1) of the Act, 21 U.S.C. section 360bbb-3(b)(1), unless the authorization is terminated or revoked.  Performed at [facility], [address] [place], [place] [address]          Radiology Studies: MR FOOT LEFT W WO CONTRAST  Result Date: 12/05/2020 CLINICAL DATA:  Diabetic foot ulcer involving the great toe. EXAM: MRI OF THE LEFT FOREFOOT WITHOUT AND WITH CONTRAST TECHNIQUE: Multiplanar, multisequence MR imaging of the left foot was performed both before and after administration of intravenous contrast. CONTRAST:  7.93mL GADAVIST GADOBUTROL 1 MMOL/ML IV SOLN COMPARISON:  Radiographs 12/02/2020 FINDINGS: Open wound noted on the plantar medial aspect of the great toe. Associated focal cellulitic changes. There is also a small focal fluid collection along the lateral aspect of the interphalangeal joint likely a small abscess.  MR findings consistent with septic arthritis at the interphalangeal joint of the great toe and associated osteomyelitis involving the distal aspect of the proximal phalanx and also the distal phalanx. Moderate degenerative changes at the first MTP joint with possible erosive changes away from the joint which could suggest gout. Myositis involving the forefoot musculature but no evidence of pyomyositis. IMPRESSION: 1. MR findings consistent with septic arthritis at the interphalangeal joint of the great toe and associated osteomyelitis involving the distal aspect of the proximal phalanx and also the distal phalanx. 2. Small abscess noted along the lateral aspect of the joint. 3. Myositis involving the forefoot musculature but no evidence of pyomyositis. Electronically Signed   By: Rudie Meyer M.D.   On: 12/05/2020 06:17   DG Foot Complete Left  Result Date: 12/04/2020 CLINICAL DATA:  Left great toe swelling/infection EXAM: LEFT FOOT - COMPLETE 3+ VIEW COMPARISON:  None. FINDINGS: No  fracture or dislocation of the left foot. There is mild bunion deformity of the first metatarsophalangeal joint with moderate associated arthrosis. Mild midfoot arthrosis. Soft tissue wound and dressing material about the medial left great toe. Vascular calcinosis. IMPRESSION: 1.  No fracture or dislocation of the left foot. 2. Soft tissue wound and dressing material about the medial left great toe. No bony erosion or sclerosis to suggest osteomyelitis. MRI is the most sensitive test for the detection of bone marrow edema and osteomyelitis if clinically suspected. Electronically Signed   By: Lauralyn PrimesAlex  Bibbey M.D.   On: 12/04/2020 12:01        Scheduled Meds:  [MAR Hold] aspirin  81 mg Oral Daily   [MAR Hold] carvedilol  6.25 mg Oral BID WC   [MAR Hold] colchicine  0.6 mg Oral BID   [MAR Hold] heparin  5,000 Units Subcutaneous Q8H   [MAR Hold] insulin aspart  0-15 Units Subcutaneous TID WC   [MAR Hold] insulin aspart  0-5  Units Subcutaneous QHS   [MAR Hold] insulin aspart  4 Units Subcutaneous TID WC   [MAR Hold] insulin glargine-yfgn  65 Units Subcutaneous Daily   [MAR Hold] ketorolac  15 mg Intravenous Q6H   [MAR Hold] lisinopril  40 mg Oral Daily   [MAR Hold] rosuvastatin  20 mg Oral Daily   Continuous Infusions:  sodium chloride 100 mL/hr at 12/05/20 0952   [MAR Hold] cefTRIAXone (ROCEPHIN)  IV 2 g (12/05/20 1114)   [MAR Hold] metronidazole 500 mg (12/05/20 2340)   [MAR Hold] promethazine (PHENERGAN) injection (IM or IVPB)     [MAR Hold] vancomycin 1,000 mg (12/06/20 0235)     LOS: 2 days    Time spent: 25 minutes    Tresa MooreSudheer B Tamiah Dysart, MD [facility] Pager [phone]  If 7PM-7AM, please contact night-coverage 12/06/2020, 10:00 AM

## 2020-12-06 NOTE — Consult Note (Signed)
PODIATRY CONSULTATION  NAME Lawrence Maynard MRN [id number] DOB [birth year] DOA 12/04/2020   Reason for consult: Osteomyelitis left hallux Chief Complaint  Patient presents with   Wound Infection    Consulting physician: Lorretta Harp, MD  History of present illness: 55 y.o. male PMHx uncontrolled DM type II with PAD admitted to the hospital for worsening infection of the left great toe ulcer.  Patient was seen in office on 12/04/2020 at which time he was advised to go to the emergency department for admission and strong antibiotics.  Upon admission podiatry and vascular was consulted.  MRI performed on 12/05/2020 positive for findings of osteomyelitis.  Patient presents this morning prepared for amputation of left great toe  Past Medical History:  Diagnosis Date   Diabetes mellitus without complication (HCC)    Gout    Hypertension    Obesity    PAD (peripheral artery disease) (HCC)     CBC Latest Ref Rng & Units 12/05/2020 12/04/2020 11/29/2020  WBC 4.0 - 10.5 K/uL 10.9(H) 9.2 9.3  Hemoglobin 13.0 - 17.0 g/dL 99.8 72.1 58.7  Hematocrit 39.0 - 52.0 % 44.7 43.0 45.0  Platelets 150 - 400 K/uL 221 209 241    BMP Latest Ref Rng & Units 12/06/2020 12/05/2020 12/04/2020  Glucose 70 - 99 mg/dL - 276(B) 848(T)  BUN 6 - 20 mg/dL - 92(N) 63(R)  Creatinine 0.61 - 1.24 mg/dL 4.32 0.03 7.94(C)  BUN/Creat Ratio 9 - 20 - - -  Sodium 135 - 145 mmol/L - 133(L) 133(L)  Potassium 3.5 - 5.1 mmol/L - 4.9 5.1  Chloride 98 - 111 mmol/L - 98 98  CO2 22 - 32 mmol/L - 28 26  Calcium 8.9 - 10.3 mg/dL - 8.9 8.9      Physical Exam: General: The patient is alert and oriented x3 in no acute distress.   Dermatology: Ulcer noted along the plantar margin of the left hallux with purulence.  Please see photo upon admission attached in the patient's H&P  Vascular: Skin is cool to touch LLE.  S/P stent placement LLE  Neurological: Epicritic and protective threshold diminished bilaterally.   Musculoskeletal Exam: No  structural deformity noted.  MRI LT foot W WO contrast 12/05/2020: IMPRESSION: 1. MR findings consistent with septic arthritis at the interphalangeal joint of the great toe and associated osteomyelitis involving the distal aspect of the proximal phalanx and also the distal phalanx. 2. Small abscess noted along the lateral aspect of the joint. 3. Myositis involving the forefoot musculature but no evidence of pyomyositis.    ASSESSMENT/PLAN OF CARE Osteomyelitis left hallux -This morning the patient was seen and evaluated in the preoperative holding area with his wife.  We discussed surgery in detail and the necessity for surgery due to the severity of the infection within the toe.  The patient understands and agrees.  All possible complications and details the procedure were explained.  No guarantees were expressed or implied. -Surgical consent consent was reviewed with the patient and we proceeded for surgery. -Continue vancomycin, Flagyl, Rocephin as needed admitting hospitalist -Podiatry will follow      Thank you for the consult.  Please contact me directly with any questions or concerns.  Cell [phone]   Felecia Shelling, DPM [facility]  Dr. Felecia Shelling, DPM    [address]  [place], [place] [address]                Office [phone]  Fax [phone]

## 2020-12-06 NOTE — Op Note (Signed)
OPERATIVE REPORT Patient name: Lawrence Maynard MRN: [id number] DOB: July 25, 1965  DOS: 12/06/2020  Preop Dx: Osteomyelitis left great toe Postop Dx: same  Procedure:  1.  Amputation left great toe  Surgeon: Felecia Shelling DPM  Anesthesia: 50-50 mixture of 2% lidocaine plain with 0.5% Marcaine plain totaling 10 mL infiltrated in the patient's left lower extremity in a hallux block fashion  Hemostasis: Ankle tourniquet inflated to a pressure of after esmarch exsanguination   EBL: Minimal mL Materials: None Injectables: None Pathology: None  Condition: The patient tolerated the procedure and anesthesia well. No complications noted or reported   Justification for procedure: The patient is a 55 y.o. male PMHx diabetes mellitus who presents today for surgical correction of osteomyelitis to the left great toe. All conservative modalities of been unsuccessful in providing any sort of satisfactory alleviation of symptoms with the patient. The patient was told benefits as well as possible side effects of the surgery. The patient consented for surgical correction. The patient consent form was reviewed. All patient questions were answered. No guarantees were expressed or implied. The patient and the surgeon both signed the patient consent form with the witness present and placed in the patient's chart.   Procedure in Detail: The patient was brought to the operating room, placed in the operating table in the supine position at which time an aseptic scrub and drape were performed about the patient's respective lower extremity after anesthesia was induced as described above. Attention was then directed to the surgical area where procedure number one commenced.  Procedure #1: Amputation left great toe A fishmouth type incision was planned and made about the left great toe just distal to the MTPJ.  The incision was carried down to the level of bone and joint capsule with care taken to cut clamp  ligate and retract away all small neurovascular structures traversing the incision site.  The hallux was grasped with a perforating towel clamp and distracted distally while a capsulotomy around the joint was performed using a surgical 15 scalpel.  The toe was disarticulated and removed in toto.  The extensor and flexor hallucis longus tendons were identified and distracted distally and cut as far proximal as could be visualized.  Copious irrigation was utilized and primary closure was obtained using combination of skin staples and 3-0 nylon suture.  Dry sterile compressive dressings were then applied to all previously mentioned incision sites about the patient's lower extremity. The tourniquet which was used for hemostasis was deflated. All normal neurovascular responses including pink color and warmth returned all the digits of patient's lower extremity.  The patient was then transferred from the operating room to the recovery room having tolerated the procedure and anesthesia well. All vital signs are stable. After a brief stay in the recovery room the patient was readmitted to inpatient room with adequate prescriptions for analgesia. Verbal as well as written instructions were provided for the patient regarding wound care. The patient is to keep the dressings clean dry and intact  Surgical impression: Amputation down to clean margins.  Intraoperatively the tissues appeared healthy and viable with adequate perfusion and healthy tissue.  I do not suspect the patient will need any long-term IV antibiotics outpatient.  Felecia Shelling, DPM [facility]  Dr. Felecia Shelling, DPM    [address]  [place], [place] [address]                Office [phone]  Fax [phone]

## 2020-12-06 NOTE — Transfer of Care (Signed)
Immediate Anesthesia Transfer of Care Note  Patient: Lawrence Maynard  Procedure(s) Performed: AMPUTATION TOE-Great Left Toe (Left: Toe)  Patient Location: PACU  Anesthesia Type:General  Level of Consciousness: sedated  Airway & Oxygen Therapy: Patient Spontanous Breathing and Patient connected to nasal cannula oxygen  Post-op Assessment: Report given to RN and Post -op Vital signs reviewed and stable  Post vital signs: Reviewed and stable  Last Vitals:  Vitals Value Taken Time  BP    Temp    Pulse    Resp    SpO2      Last Pain:  Vitals:   12/06/20 0549  TempSrc: Oral  PainSc:       Patients Stated Pain Goal: 0 (12/05/20 2147)  Complications: No notable events documented.

## 2020-12-07 ENCOUNTER — Encounter: Payer: Self-pay | Admitting: Podiatry

## 2020-12-07 DIAGNOSIS — E11628 Type 2 diabetes mellitus with other skin complications: Secondary | ICD-10-CM | POA: Diagnosis not present

## 2020-12-07 DIAGNOSIS — L089 Local infection of the skin and subcutaneous tissue, unspecified: Secondary | ICD-10-CM | POA: Diagnosis not present

## 2020-12-07 LAB — BASIC METABOLIC PANEL
Anion gap: 5 (ref 5–15)
BUN: 31 mg/dL — ABNORMAL HIGH (ref 6–20)
CO2: 28 mmol/L (ref 22–32)
Calcium: 7.8 mg/dL — ABNORMAL LOW (ref 8.9–10.3)
Chloride: 105 mmol/L (ref 98–111)
Creatinine, Ser: 0.8 mg/dL (ref 0.61–1.24)
GFR, Estimated: >60 mL/min (ref >60)
Glucose, Bld: 124 mg/dL — ABNORMAL HIGH (ref 70–99)
Potassium: 4.4 mmol/L (ref 3.5–5.1)
Sodium: 138 mmol/L (ref 135–145)

## 2020-12-07 LAB — GLUCOSE, CAPILLARY
Glucose-Capillary: 72 mg/dL (ref 70–99)
Glucose-Capillary: 199 mg/dL — ABNORMAL HIGH (ref 70–99)
Glucose-Capillary: 115 mg/dL — ABNORMAL HIGH (ref 70–99)

## 2020-12-07 MED ORDER — AMOXICILLIN-POT CLAVULANATE 875-125 MG PO TABS: 1.0000 | ORAL_TABLET | Freq: Two times a day (BID) | ORAL | 0 refills | Status: AC

## 2020-12-07 MED ORDER — OXYCODONE HCL 5 MG PO TABS: 5.0000 mg | ORAL_TABLET | ORAL | 0 refills | Status: AC | PRN

## 2020-12-07 MED ORDER — TOUJEO MAX SOLOSTAR 300 UNIT/ML ~~LOC~~ SOPN: 80.0000 [IU] | PEN_INJECTOR | Freq: Every day | SUBCUTANEOUS | 0 refills | Status: AC

## 2020-12-07 MED ORDER — ACETAMINOPHEN 325 MG PO TABS: 650.0000 mg | ORAL_TABLET | Freq: Four times a day (QID) | ORAL | Status: AC | PRN

## 2020-12-07 NOTE — Progress Notes (Signed)
OT Cancellation Note  Patient Details Name: Lawrence Maynard MRN: [id number] DOB: Jul 09, 1965   Cancelled Treatment:    Reason Eval/Treat Not Completed: OT screened, no needs identified, will sign off. Order received, chart reviewed. Per discussion with tx team, pt appears to be at baseline functional independence with ADLs/functional mobility. No skilled OT needs identified. Will sign off. Please re-consult if additional needs arise.    Matthew Folks, OTR/L [facility] [phone]

## 2020-12-07 NOTE — Progress Notes (Signed)
Pt d/c to home. All pt belongings sent home. Discharge education provided and questions answered. Pt IV removed.

## 2020-12-07 NOTE — Discharge Summary (Signed)
Physician Discharge Summary  [name] YIR:[id number] DOB: 12-23-1965 DOA: 12/04/2020  PCP: Jac Canavan, PA-C  Admit date: 12/04/2020 Discharge date: 12/07/2020  Admitted From: Home Disposition: Home  Recommendations for Outpatient Follow-up:  Follow up with PCP in 1-2 weeks Follow-up with podiatry as directed  Home Health: No Equipment/Devices: None Discharge Condition: Stable CODE STATUS: Full Diet recommendation: Heart Healthy / Carb Modified  Brief/Interim Summary: 55 y.o. male with medical history significant of PAD (s/p of stent placement in left leg), hypertension, hyperlipidemia, diabetes mellitus, stroke, gout, who presents with left great toe ulcer with infection.   Patient states that he has a diabetic ulcer with infection in left great toe.  He has been followed up by podiatrist.  He was treated with doxycycline for several days, then switched to Augmentin on 9/4, but his left great toe infection has been progressively worsening.  Patient does not have fever or chills.  He has constant pain in left great toe, which is sharp, 8 out of 10 in severity, nonradiating.  He has bloody drainage.     Seen in consultation by [facility] Dr. Wyn Quaker.  At this time patient declining angiography that was recommended.  States he would rather follow-up with his established Physiological scientist in [place].     Podiatry consulted.  Patient recommended left hallux amputation.  Surgery was done on 9/10.  Seen on postoperative day 1.  Doing well postoperatively.  Pain well controlled.  Discussed with podiatry.  Surgical margins appeared clean intraoperatively.  Cleared for discharge from podiatry standpoint.  No wound care needed at home.  Patient will discharge home on oral antibiotics and present to the podiatry office within 1 week for wound check and dressing change.   Discharge Diagnoses:  Principal Problem:   Diabetic infection of left foot_great toe Active Problems:   Essential hypertension    Hyperlipidemia   Peripheral arterial disease (HCC)   History of stroke   Type II diabetes mellitus with peripheral circulatory disorder (HCC)  Diabetic infection of left foot_great toe Associated septic arthritis/osteomyelitis Patient failed outpatient oral antibiotic treatment.   Symptoms has been worsening.   Not septic.  Hemodynamically stable.  Consulted Dr. Wyn Quaker of [facility] and Dr. 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 Wyn Quaker of vascular surgery -Patient declined recommended angiography -Encourage close follow-up with established vascular surgeon in [place]   History of stroke -Continue aspirin, Crestor -Can resume Plavix at discharge   Type II diabetes mellitus with peripheral circulatory disorder (HCC)  Recent A1c 9.1, poorly controlled.   Patient is taking Synjardy and glargine insulin Can resume home regimen on discharge  Discharge Instructions  Discharge Instructions     Diet - low sodium heart healthy   Complete by: As directed    Increase activity slowly   Complete by: As directed    No wound care   Complete by: As directed       Allergies as of 12/07/2020   No Known Allergies      Medication List  STOP taking these medications    ketorolac 10 MG tablet Commonly known as: TORADOL   oxyCODONE-acetaminophen 5-325 MG tablet Commonly known as: Percocet   Ozempic (1 MG/DOSE) 4 MG/3ML Sopn Generic drug:  Semaglutide (1 MG/DOSE)   predniSONE 20 MG tablet Commonly known as: DELTASONE   pregabalin 50 MG capsule Commonly known as: Lyrica   traMADol 50 MG tablet Commonly known as: Ultram       TAKE these medications    acetaminophen 325 MG tablet Commonly known as: TYLENOL Take 2 tablets (650 mg total) by mouth every 6 (six) hours as needed for mild pain or fever.   allopurinol 100 MG tablet Commonly known as: ZYLOPRIM Take 100 mg by mouth daily. What changed: Another medication with the same name was removed. Continue taking this medication, and follow the directions you see here.   amoxicillin-clavulanate 875-125 MG tablet Commonly known as: AUGMENTIN Take 1 tablet by mouth every 12 (twelve) hours for 7 days. Start taking on: December 08, 2020   aspirin 81 MG chewable tablet Chew 1 tablet (81 mg total) by mouth daily.   BD Pen Needle Nano U/F 32G X 4 MM Misc Generic drug: Insulin Pen Needle 1 each by Does not apply route at bedtime.   carvedilol 6.25 MG tablet Commonly known as: COREG Take 6.25 mg by mouth 2 (two) times daily with a meal.   clopidogrel 75 MG tablet Commonly known as: PLAVIX Take 1 tablet (75 mg total) by mouth daily.   ibuprofen 800 MG tablet Commonly known as: ADVIL Take 1 tablet (800 mg total) by mouth every 6 (six) hours as needed. What changed: reasons to take this   insulin lispro 100 UNIT/ML injection Commonly known as: HUMALOG Inject into the skin as needed for high blood sugar. As needed for hyperglycemia (no specific dose)   lisinopril 40 MG tablet Commonly known as: ZESTRIL Take 1 tablet (40 mg total) by mouth daily.   oxyCODONE 5 MG immediate release tablet Commonly known as: Roxicodone Take 1 tablet (5 mg total) by mouth every 4 (four) hours as needed for severe pain.   Repatha 140 MG/ML Sosy Generic drug: Evolocumab INJECT 140 MG SUBCUTANEOUSLY EVERY 14 DAYS What changed: See the new instructions.   rosuvastatin 20 MG  tablet Commonly known as: CRESTOR Take 1 tablet (20 mg total) by mouth at bedtime. What changed: when to take this   sildenafil 100 MG tablet Commonly known as: Viagra Take 1 tablet (100 mg total) by mouth as needed for erectile dysfunction.   Synjardy 12.07-998 MG Tabs Generic drug: Empagliflozin-metFORMIN HCl Take 1 tablet by mouth 2 (two) times daily.   Toujeo Max SoloStar 300 UNIT/ML Solostar Pen Generic drug: insulin glargine (2 Unit Dial) Inject 80 Units into the skin daily.        No Known Allergies  Consultations: Podiatry   Procedures/Studies: CT FOOT LEFT W CONTRAST  Result Date: 11/30/2020 CLINICAL DATA:  Question osteomyelitis of foot EXAM: CT OF THE LOWER LEFT EXTREMITY WITH CONTRAST TECHNIQUE: Multidetector CT imaging of the lower left extremity was performed according to the standard protocol following intravenous contrast administration. CONTRAST:  53mL OMNIPAQUE IOHEXOL 350 MG/ML SOLN COMPARISON:  None. FINDINGS: Bones/Joint/Cartilage Degenerative changes noted at the 1st MTP joint and IP joint. Bony erosions noted in the distal 1st metatarsal elated to arthritic changes. No bone destruction seen to suggest osteomyelitis. Ligaments Suboptimally assessed by CT. Muscles and Tendons Grossly unremarkable Soft tissues No soft tissue gas or fluid collection. IMPRESSION:  No bone destruction seen to suggest osteomyelitis. No acute bony abnormality. If clinical suspicion for osteomyelitis continues, MRI would be recommended for further evaluation. Electronically Signed   By: Charlett Nose M.D.   On: 11/30/2020 03:48   MR FOOT LEFT W WO CONTRAST  Result Date: 12/05/2020 CLINICAL DATA:  Diabetic foot ulcer involving the great toe. EXAM: MRI OF THE LEFT FOREFOOT WITHOUT AND WITH CONTRAST TECHNIQUE: Multiplanar, multisequence MR imaging of the left foot was performed both before and after administration of intravenous contrast. CONTRAST:  7.50mL GADAVIST GADOBUTROL 1 MMOL/ML IV SOLN  COMPARISON:  Radiographs 12/02/2020 FINDINGS: Open wound noted on the plantar medial aspect of the great toe. Associated focal cellulitic changes. There is also a small focal fluid collection along the lateral aspect of the interphalangeal joint likely a small abscess. MR findings consistent with septic arthritis at the interphalangeal joint of the great toe and associated osteomyelitis involving the distal aspect of the proximal phalanx and also the distal phalanx. Moderate degenerative changes at the first MTP joint with possible erosive changes away from the joint which could suggest gout. Myositis involving the forefoot musculature but no evidence of pyomyositis. IMPRESSION: 1. MR findings consistent with septic arthritis at the interphalangeal joint of the great toe and associated osteomyelitis involving the distal aspect of the proximal phalanx and also the distal phalanx. 2. Small abscess noted along the lateral aspect of the joint. 3. Myositis involving the forefoot musculature but no evidence of pyomyositis. Electronically Signed   By: Rudie Meyer M.D.   On: 12/05/2020 06:17   DG Foot 2 Views Left  Result Date: 12/06/2020 CLINICAL DATA:  Post amputation. EXAM: LEFT FOOT - 2 VIEW COMPARISON:  December 04, 2020 FINDINGS: There has been interval amputation of the first left toe at the level of the metatarsophalangeal joint. There is soft tissue swelling at the amputation site. Skin staples in place. No soft tissue emphysema is seen. No fracture or dislocation. IMPRESSION: Post amputation of the first left toe at the level of the metatarsophalangeal joint with associated soft tissue swelling. Electronically Signed   By: Ted Mcalpine M.D.   On: 12/06/2020 11:55   DG Foot Complete Left  Result Date: 12/04/2020 CLINICAL DATA:  Left great toe swelling/infection EXAM: LEFT FOOT - COMPLETE 3+ VIEW COMPARISON:  None. FINDINGS: No fracture or dislocation of the left foot. There is mild bunion deformity  of the first metatarsophalangeal joint with moderate associated arthrosis. Mild midfoot arthrosis. Soft tissue wound and dressing material about the medial left great toe. Vascular calcinosis. IMPRESSION: 1.  No fracture or dislocation of the left foot. 2. Soft tissue wound and dressing material about the medial left great toe. No bony erosion or sclerosis to suggest osteomyelitis. MRI is the most sensitive test for the detection of bone marrow edema and osteomyelitis if clinically suspected. Electronically Signed   By: Lauralyn Primes M.D.   On: 12/04/2020 12:01   DG Toe Great Left  Result Date: 11/29/2020 CLINICAL DATA:  Possible infection, ingrown toenail EXAM: LEFT GREAT TOE COMPARISON:  None. FINDINGS: Degenerative changes in the 1st MTP and IP joints. No acute bony abnormality. Specifically, no fracture, subluxation, or dislocation. No bone destruction to suggest osteomyelitis. IMPRESSION: No acute bony abnormality. Electronically Signed   By: Charlett Nose M.D.   On: 11/29/2020 22:46   (Echo, Carotid, EGD, Colonoscopy, ERCP)    Subjective: Seen and examined on the day of discharge.  Wife at bedside.  Pain well controlled.  All  questions answered.  Stable for discharge home.  Discharge Exam: Vitals:   12/07/20 0456 12/07/20 0753  BP: (!) 147/92 (!) 148/91  Pulse: 70 89  Resp: 16 18  Temp: 98 F (36.7 C) 98.6 F (37 C)  SpO2: 97% 100%   Vitals:   12/06/20 2047 12/07/20 0100 12/07/20 0456 12/07/20 0753  BP: 114/74 133/75 (!) 147/92 (!) 148/91  Pulse: 71 66 70 89  Resp: Temp: 98.3 F (36.8 C) 98 F (36.7 C) 98 F (36.7 C) 98.6 F (37 C)  TempSrc: Oral Oral Oral   SpO2: 96% 99% 97% 100%  Weight:      Height:        General: Pt is alert, awake, not in acute distress Cardiovascular: RRR, S1/S2 +, no rubs, no gallops Respiratory: CTA bilaterally, no wheezing, no rhonchi Abdominal: Soft, NT, ND, bowel sounds + Extremities: no edema, no cyanosis    The results of  significant diagnostics from this hospitalization (including imaging, microbiology, ancillary and laboratory) are listed below for reference.     Microbiology: Recent Results (from the past 240 hour(s))  Resp Panel by RT-PCR (Flu A&B, Covid) Nasopharyngeal Swab     Status: None   Collection Time: 12/04/20 11:42 AM   Specimen: Nasopharyngeal Swab; Nasopharyngeal(NP) swabs in vial transport medium  Result Value Ref Range Status   SARS Coronavirus 2 by RT PCR NEGATIVE NEGATIVE Final    Comment: (NOTE) SARS-CoV-2 target nucleic acids are NOT DETECTED.  The SARS-CoV-2 RNA is generally detectable in upper respiratory specimens during the acute phase of infection. The lowest concentration of SARS-CoV-2 viral copies this assay can detect is 138 copies/mL. A negative result does not preclude SARS-Cov-2 infection and should not be used as the sole basis for treatment or other patient management decisions. A negative result may occur with  improper specimen collection/handling, submission of specimen other than nasopharyngeal swab, presence of viral mutation(s) within the areas targeted by this assay, and inadequate number of viral copies(<138 copies/mL). A negative result must be combined with clinical observations, patient history, and epidemiological information. The expected result is Negative.  Fact Sheet for Patients:  BloggerCourse.com  Fact Sheet for Healthcare Providers:  SeriousBroker.it  This test is no t yet approved or cleared by the [place] FDA and  has been authorized for detection and/or diagnosis of SARS-CoV-2 by FDA under an Emergency Use Authorization (EUA). This EUA will remain  in effect (meaning this test can be used) for the duration of the COVID-19 declaration under Section 564(b)(1) of the Act, 21 U.S.C.section 360bbb-3(b)(1), unless the authorization is terminated  or revoked sooner.       Influenza A by  PCR NEGATIVE NEGATIVE Final   Influenza B by PCR NEGATIVE NEGATIVE Final    Comment: (NOTE) The Xpert Xpress SARS-CoV-2/FLU/RSV plus assay is intended as an aid in the diagnosis of influenza from Nasopharyngeal swab specimens and should not be used as a sole basis for treatment. Nasal washings and aspirates are unacceptable for Xpert Xpress SARS-CoV-2/FLU/RSV testing.  Fact Sheet for Patients: BloggerCourse.com  Fact Sheet for Healthcare Providers: SeriousBroker.it  This test is not yet approved or cleared by the [place] FDA and has been authorized for detection and/or diagnosis of SARS-CoV-2 by FDA under an Emergency Use Authorization (EUA). This EUA will remain in effect (meaning this test can be used) for the duration of the COVID-19 declaration under Section 564(b)(1) of the Act, 21 U.S.C. section 360bbb-3(b)(1), unless the authorization  is terminated or revoked.  Performed at [facility], [address] [place], [place] [id number]      Labs: BNP (last 3 results) No results for input(s): BNP in the last 8760 hours. Basic Metabolic Panel: Recent Labs  Lab 12/04/20 0936 12/05/20 0456 12/06/20 0557 12/07/20 0434  NA 133* 133*  --  138  K 5.1 4.9  --  4.4  CL 98 98  --  105  CO2 26 28  --  28  GLUCOSE 230* 206*  --  124*  BUN 29* 24*  --  31*  CREATININE 1.28* 0.84 0.96 0.80  CALCIUM 8.9 8.9  --  7.8*   Liver Function Tests: No results for input(s): AST, ALT, ALKPHOS, BILITOT, PROT, ALBUMIN in the last 168 hours. No results for input(s): LIPASE, AMYLASE in the last 168 hours. No results for input(s): AMMONIA in the last 168 hours. CBC: Recent Labs  Lab 12/04/20 0936 12/05/20 0456  WBC 9.2 10.9*  HGB 13.8 13.8  HCT 43.0 44.7  MCV 86.3 86.0  PLT 209 221   Cardiac Enzymes: No results for input(s): CKTOTAL, CKMB, CKMBINDEX, TROPONINI in the last 168 hours. BNP: Invalid input(s):  POCBNP CBG: Recent Labs  Lab 12/06/20 1204 12/06/20 1635 12/06/20 2048 12/07/20 0618 12/07/20 0833  GLUCAP 120* 247* 218* 72 199*   D-Dimer No results for input(s): DDIMER in the last 72 hours. Hgb A1c No results for input(s): HGBA1C in the last 72 hours. Lipid Profile No results for input(s): CHOL, HDL, LDLCALC, TRIG, CHOLHDL, LDLDIRECT in the last 72 hours. Thyroid function studies No results for input(s): TSH, T4TOTAL, T3FREE, THYROIDAB in the last 72 hours.  Invalid input(s): FREET3 Anemia work up No results for input(s): VITAMINB12, FOLATE, FERRITIN, TIBC, IRON, RETICCTPCT in the last 72 hours. Urinalysis    Component Value Date/Time   LABSPEC 1.020 08/15/2020 1506   BILIRUBINUR negative 08/15/2020 1506   KETONESUR negative 08/15/2020 1506   PROTEINUR negative 08/15/2020 1506   NITRITE Negative 08/15/2020 1506   LEUKOCYTESUR Negative 08/15/2020 1506   Sepsis Labs Invalid input(s): PROCALCITONIN,  WBC,  LACTICIDVEN Microbiology Recent Results (from the past 240 hour(s))  Resp Panel by RT-PCR (Flu A&B, Covid) Nasopharyngeal Swab     Status: None   Collection Time: 12/04/20 11:42 AM   Specimen: Nasopharyngeal Swab; Nasopharyngeal(NP) swabs in vial transport medium  Result Value Ref Range Status   SARS Coronavirus 2 by RT PCR NEGATIVE NEGATIVE Final    Comment: (NOTE) SARS-CoV-2 target nucleic acids are NOT DETECTED.  The SARS-CoV-2 RNA is generally detectable in upper respiratory specimens during the acute phase of infection. The lowest concentration of SARS-CoV-2 viral copies this assay can detect is 138 copies/mL. A negative result does not preclude SARS-Cov-2 infection and should not be used as the sole basis for treatment or other patient management decisions. A negative result may occur with  improper specimen collection/handling, submission of specimen other than nasopharyngeal swab, presence of viral mutation(s) within the areas targeted by this assay,  and inadequate number of viral copies(<138 copies/mL). A negative result must be combined with clinical observations, patient history, and epidemiological information. The expected result is Negative.  Fact Sheet for Patients:  BloggerCourse.comhttps://www.fda.gov/media/152166/download  Fact Sheet for Healthcare Providers:  SeriousBroker.ithttps://www.fda.gov/media/152162/download  This test is no t yet approved or cleared by the [place] FDA and  has been authorized for detection and/or diagnosis of SARS-CoV-2 by FDA under an Emergency Use Authorization (EUA). This EUA will remain  in effect (meaning  this test can be used) for the duration of the COVID-19 declaration under Section 564(b)(1) of the Act, 21 U.S.C.section 360bbb-3(b)(1), unless the authorization is terminated  or revoked sooner.       Influenza A by PCR NEGATIVE NEGATIVE Final   Influenza B by PCR NEGATIVE NEGATIVE Final    Comment: (NOTE) The Xpert Xpress SARS-CoV-2/FLU/RSV plus assay is intended as an aid in the diagnosis of influenza from Nasopharyngeal swab specimens and should not be used as a sole basis for treatment. Nasal washings and aspirates are unacceptable for Xpert Xpress SARS-CoV-2/FLU/RSV testing.  Fact Sheet for Patients: BloggerCourse.com  Fact Sheet for Healthcare Providers: SeriousBroker.it  This test is not yet approved or cleared by the [place] FDA and has been authorized for detection and/or diagnosis of SARS-CoV-2 by FDA under an Emergency Use Authorization (EUA). This EUA will remain in effect (meaning this test can be used) for the duration of the COVID-19 declaration under Section 564(b)(1) of the Act, 21 U.S.C. section 360bbb-3(b)(1), unless the authorization is terminated or revoked.  Performed at [facility], [address] [place], [place] [address]      Time coordinating discharge: Over 30 minutes  SIGNED:   Tresa Moore,  MD  [facility] 12/07/2020, 10:50 AM Pager   If 7PM-7AM, please contact night-coverage

## 2020-12-08 ENCOUNTER — Telehealth: Payer: Self-pay | Admitting: Internal Medicine

## 2020-12-08 NOTE — Telephone Encounter (Signed)
Pt is in the process of moving out of [place] and will be finding another doctor soon. I will take Lawrence Maynard's name off as pcp

## 2020-12-09 ENCOUNTER — Telehealth: Payer: Self-pay | Admitting: Cardiovascular Disease

## 2020-12-09 LAB — SURGICAL PATHOLOGY

## 2020-12-09 NOTE — Telephone Encounter (Signed)
Patient is requesting to speak with Dr. Hazle Coca nurse regarding an ultrasound he had recently.

## 2020-12-09 NOTE — Telephone Encounter (Signed)
Left message to call back  

## 2020-12-10 NOTE — Telephone Encounter (Signed)
Spoke with the patient who states that he would like the results of testing done when he saw Dr. Allyson Sabal. Patient was seen in the office 8/30. He has since had an amputation of his left toe. He states that he is following up with podiatry on Friday and they want results from testing when he saw Dr. Allyson Sabal. Advised patient that I did not see any testing that was done on that day. The only testing I see it what was done in the hospital. Most recent testing ordered by Dr. Better was 06/2020.

## 2020-12-10 NOTE — Telephone Encounter (Signed)
Follow Up:     Patient is returning Lawrence Maynard's call fro today.

## 2020-12-12 ENCOUNTER — Encounter: Payer: BC Managed Care – PPO | Admitting: Podiatry

## 2020-12-12 ENCOUNTER — Ambulatory Visit (INDEPENDENT_AMBULATORY_CARE_PROVIDER_SITE_OTHER): Payer: BC Managed Care – PPO | Admitting: Podiatry

## 2020-12-12 ENCOUNTER — Other Ambulatory Visit: Payer: Self-pay

## 2020-12-12 ENCOUNTER — Ambulatory Visit: Payer: BC Managed Care – PPO

## 2020-12-12 DIAGNOSIS — Z9889 Other specified postprocedural states: Secondary | ICD-10-CM

## 2020-12-12 MED ORDER — HYDROCODONE-ACETAMINOPHEN 5-325 MG PO TABS: 1.0000 | ORAL_TABLET | Freq: Four times a day (QID) | ORAL | 0 refills | Status: AC | PRN

## 2020-12-16 NOTE — Progress Notes (Signed)
   Subjective:  Patient presents today status post left great toe amputation. DOS: 12/06/2020.  Patient states that last night he was having some major pain around the amputation site.  Overall he is doing well except for last night however.  He is The dressings clean dry and intact and presents for follow-up treatment and evaluation  Past Medical History:  Diagnosis Date   Diabetes mellitus without complication (HCC)    Gout    Hypertension    Obesity    PAD (peripheral artery disease) (HCC)       Objective/Physical Exam Neurovascular status intact.  Skin incisions appear to be well coapted with sutures and staples intact. No sign of infectious process noted. No dehiscence. No active bleeding noted. Moderate edema noted to the surgical extremity.   Assessment: 1. s/p left great toe amputation. DOS: 12/06/2020   Plan of Care:  1. Patient was evaluated.  2.  Overall the incision site and amputation site appears healthy with good routine healing.  Dressings were changed today.  Clean dry and intact 3.  Prescription for Vicodin 5/325 mg as needed pain 4.  Continue weightbearing in the postsurgical shoe 5.  Return to clinic in 2 weeks for suture and staple removal   Felecia Shelling, DPM [facility]  Dr. Felecia Shelling, DPM    [address] [place], [place] [address]                Office [phone]  Fax [phone]

## 2020-12-30 ENCOUNTER — Ambulatory Visit (INDEPENDENT_AMBULATORY_CARE_PROVIDER_SITE_OTHER): Payer: BC Managed Care – PPO | Admitting: Podiatry

## 2020-12-30 ENCOUNTER — Other Ambulatory Visit: Payer: Self-pay

## 2020-12-30 DIAGNOSIS — E0843 Diabetes mellitus due to underlying condition with diabetic autonomic (poly)neuropathy: Secondary | ICD-10-CM

## 2020-12-30 DIAGNOSIS — Z9889 Other specified postprocedural states: Secondary | ICD-10-CM

## 2020-12-30 MED ORDER — PREGABALIN 75 MG PO CAPS: 75.0000 mg | ORAL_CAPSULE | Freq: Two times a day (BID) | ORAL | 3 refills | Status: AC

## 2020-12-30 NOTE — Progress Notes (Signed)
   Subjective:  Patient presents today status post left great toe amputation. DOS: 12/06/2020.  Patient states that his PCP provided some Lyrica 25 mg to help with some of the nerve pain.  Overall he is doing well and continues to wear the postsurgical shoe  Past Medical History:  Diagnosis Date   Diabetes mellitus without complication (HCC)    Gout    Hypertension    Obesity    PAD (peripheral artery disease) (HCC)       Objective/Physical Exam Neurovascular status intact.  Skin incisions appear to be well coapted with sutures and staples intact. No sign of infectious process noted. No dehiscence. No active bleeding noted. Moderate edema noted to the surgical extremity.   Assessment: 1. s/p left great toe amputation. DOS: 12/06/2020-healed   Plan of Care:  1. Patient was evaluated.  Amputation site healed 2.  Sutures and staples removed today 3.  Appointment with Pedorthist for new custom molded orthotics and diabetic shoes with a toe filler 4.  Prescription for Lyrica 75 mg 2 times daily 5.  Patient expected return to work date 01/05/2021.  Okay to return to work.  Discontinue postsurgical shoe.  Recommend good supportive shoes and sneakers  6.  Return to clinic in 3 months for routine foot care  Felecia Shelling, DPM [facility]  Dr. Felecia Shelling, DPM    [address] [place], [place] [address]                Office [phone]  Fax [phone]

## 2021-01-12 ENCOUNTER — Other Ambulatory Visit (HOSPITAL_BASED_OUTPATIENT_CLINIC_OR_DEPARTMENT_OTHER): Payer: Self-pay | Admitting: Cardiovascular Disease

## 2021-01-12 DIAGNOSIS — I739 Peripheral vascular disease, unspecified: Secondary | ICD-10-CM

## 2021-02-04 ENCOUNTER — Other Ambulatory Visit: Payer: Self-pay

## 2021-02-27 ENCOUNTER — Ambulatory Visit: Payer: BC Managed Care – PPO | Admitting: Cardiovascular Disease

## 2021-04-02 ENCOUNTER — Ambulatory Visit: Payer: Self-pay | Admitting: Podiatry

## 2022-11-27 IMAGING — CR DG ANKLE COMPLETE 3+V*L*
3 series · 3 of 3 positions shown · non-contrast
Comparison: None.

CLINICAL DATA: Ankle pain

EXAM:
LEFT ANKLE COMPLETE - 3+ VIEW

[x ankle lat left]
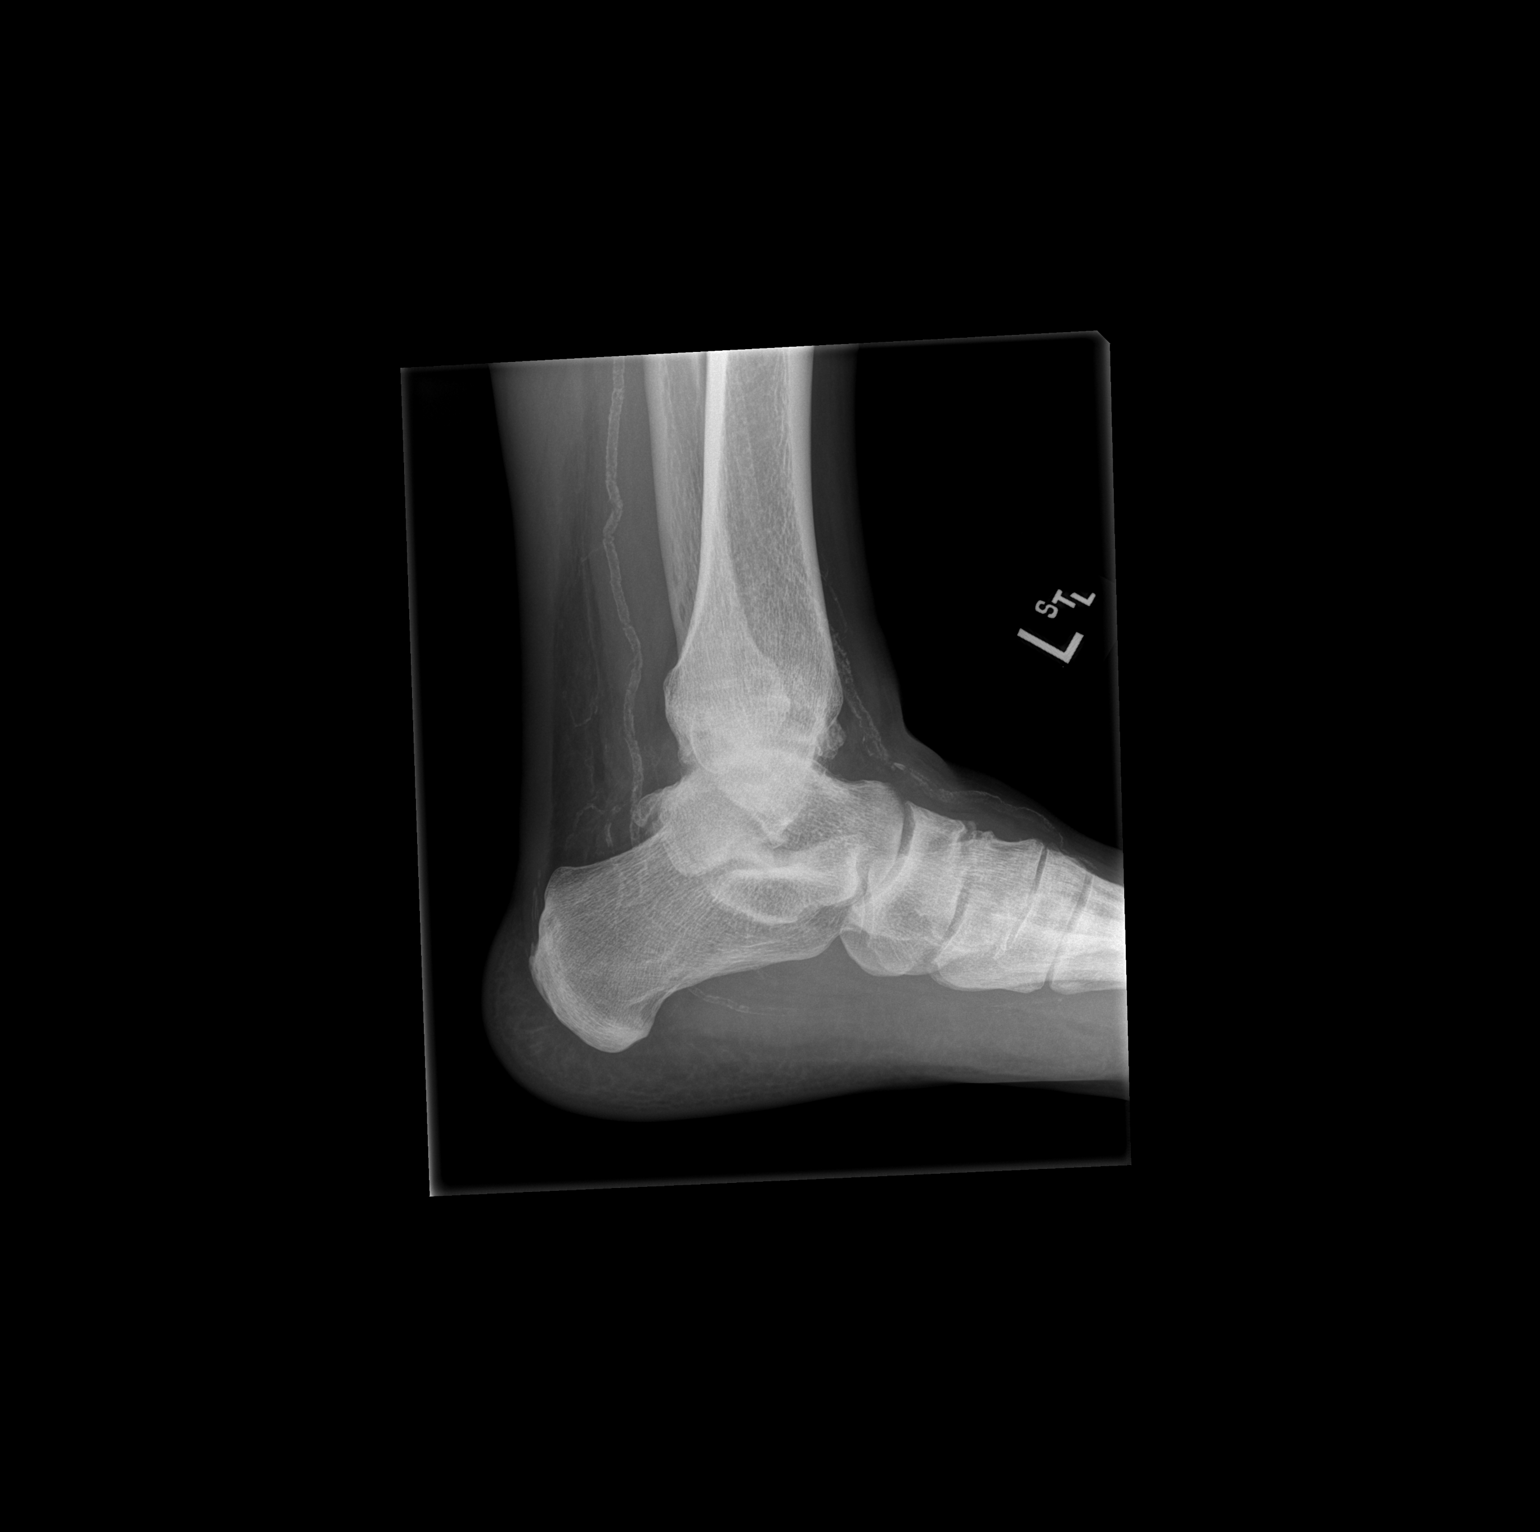

[x ankle ap left]
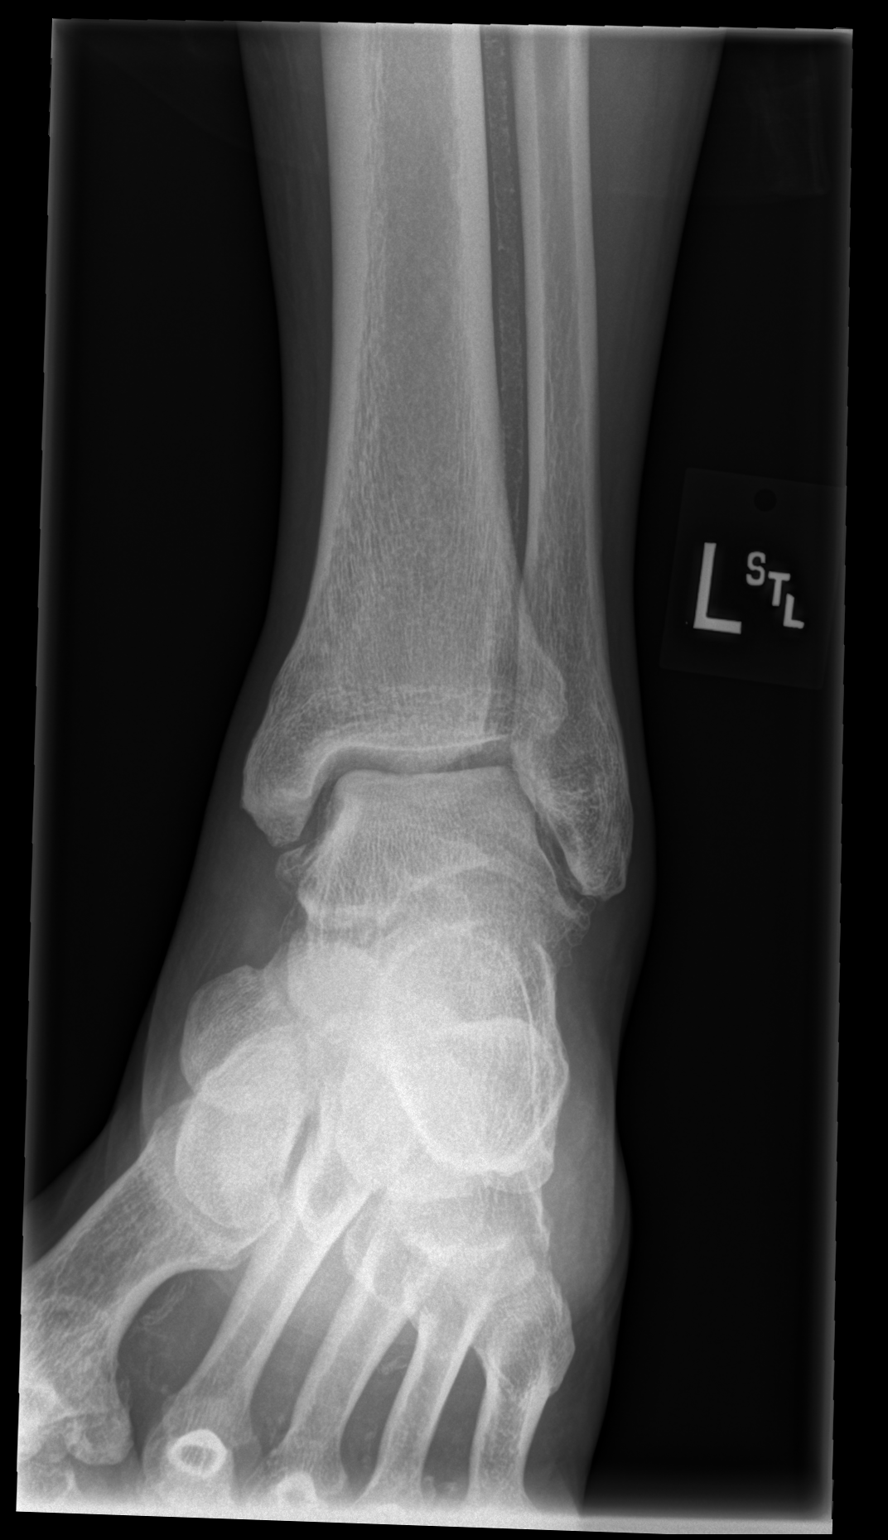

[x ankle obl left]
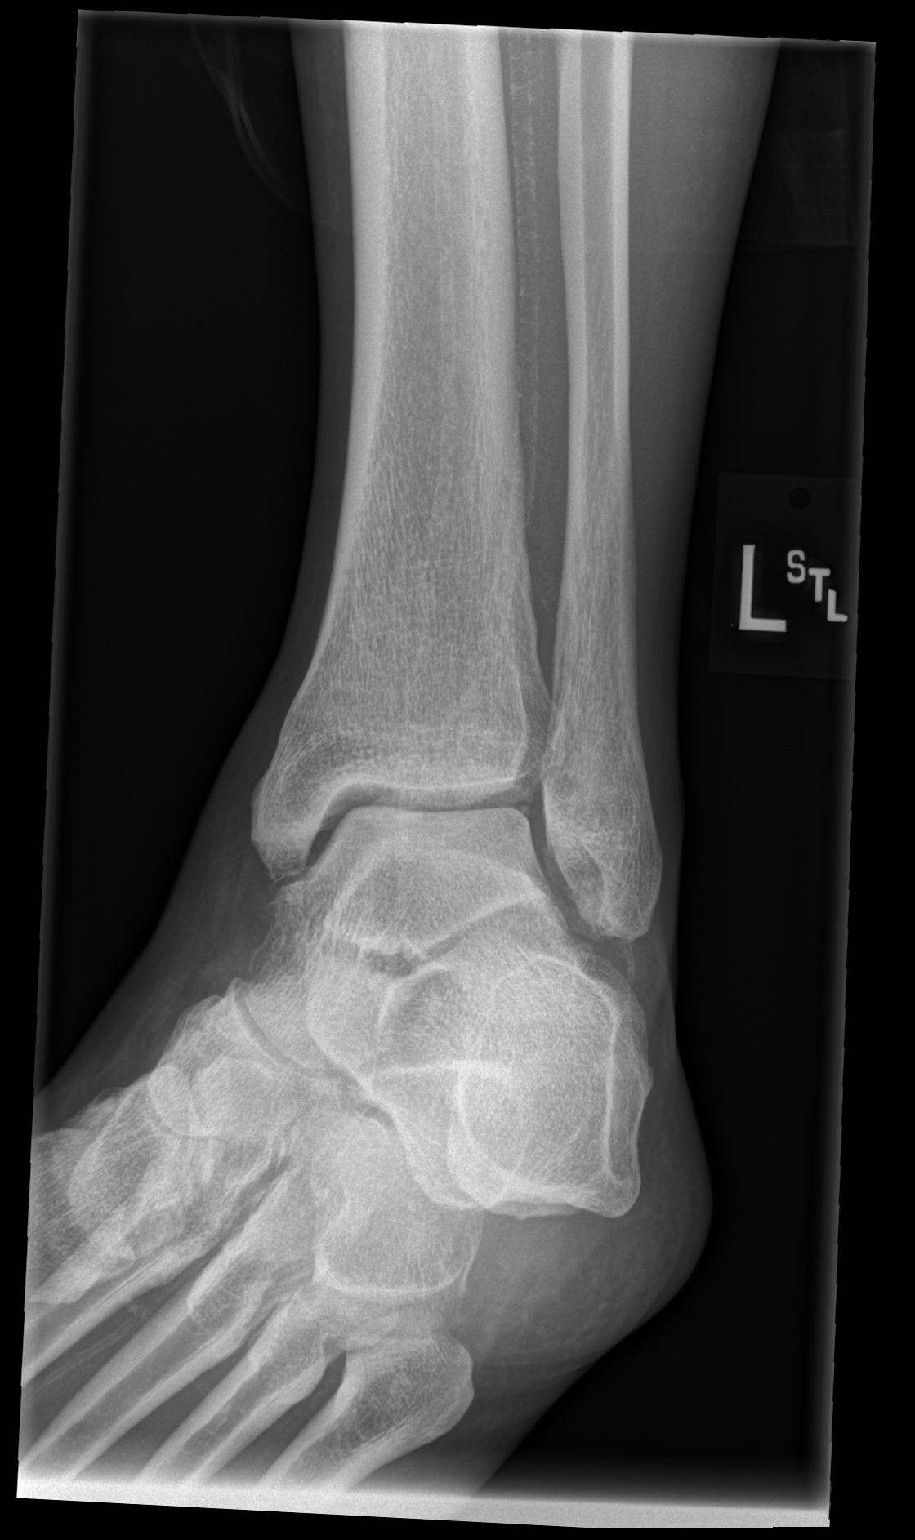

[3 of 3 positions shown; findings below may reference images not displayed]

FINDINGS: Mild spurring in the ankle joint and joint space narrowing. No acute
bony abnormality. Specifically, no fracture, subluxation, or
dislocation. Diffuse vascular calcifications.
IMPRESSION: No acute bony abnormality.
